# Patient Record
Sex: Male | Born: 1935 | Race: White | Hispanic: No | Marital: Married | Smoking: Former smoker
Health system: Southern US, Community
[De-identification: ages and names within clinical notes are randomized; demographics above are authoritative.]

## PROBLEM LIST (undated history)

## (undated) DIAGNOSIS — E119 Type 2 diabetes mellitus without complications: Secondary | ICD-10-CM

## (undated) DIAGNOSIS — I10 Essential (primary) hypertension: Secondary | ICD-10-CM

## (undated) HISTORY — DX: Type 2 diabetes mellitus without complications: E11.9

## (undated) HISTORY — DX: Essential (primary) hypertension: I10

## (undated) HISTORY — PX: CARDIAC SURGERY: SHX584

---

## 2005-01-09 ENCOUNTER — Ambulatory Visit: Payer: Self-pay | Admitting: Gastroenterology

## 2008-01-08 ENCOUNTER — Ambulatory Visit: Payer: Self-pay | Admitting: Internal Medicine

## 2008-01-16 ENCOUNTER — Ambulatory Visit: Payer: Self-pay | Admitting: Internal Medicine

## 2008-02-07 ENCOUNTER — Ambulatory Visit: Payer: Self-pay | Admitting: Internal Medicine

## 2008-03-06 ENCOUNTER — Ambulatory Visit: Payer: Self-pay | Admitting: Unknown Physician Specialty

## 2008-03-09 ENCOUNTER — Ambulatory Visit: Payer: Self-pay | Admitting: Internal Medicine

## 2008-03-19 ENCOUNTER — Ambulatory Visit: Payer: Self-pay | Admitting: Internal Medicine

## 2008-04-09 ENCOUNTER — Ambulatory Visit: Payer: Self-pay | Admitting: Internal Medicine

## 2008-05-07 ENCOUNTER — Ambulatory Visit: Payer: Self-pay | Admitting: Internal Medicine

## 2008-05-14 ENCOUNTER — Ambulatory Visit: Payer: Self-pay | Admitting: Internal Medicine

## 2008-06-05 ENCOUNTER — Ambulatory Visit: Payer: Self-pay | Admitting: Unknown Physician Specialty

## 2008-06-07 ENCOUNTER — Ambulatory Visit: Payer: Self-pay | Admitting: Internal Medicine

## 2008-08-07 ENCOUNTER — Ambulatory Visit: Payer: Self-pay | Admitting: Internal Medicine

## 2008-08-13 ENCOUNTER — Ambulatory Visit: Payer: Self-pay | Admitting: Internal Medicine

## 2008-09-06 ENCOUNTER — Ambulatory Visit: Payer: Self-pay | Admitting: Internal Medicine

## 2008-10-07 ENCOUNTER — Ambulatory Visit: Payer: Self-pay | Admitting: Internal Medicine

## 2008-10-08 ENCOUNTER — Ambulatory Visit: Payer: Self-pay | Admitting: Internal Medicine

## 2008-11-07 ENCOUNTER — Ambulatory Visit: Payer: Self-pay | Admitting: Internal Medicine

## 2008-12-07 ENCOUNTER — Ambulatory Visit: Payer: Self-pay | Admitting: Internal Medicine

## 2008-12-07 ENCOUNTER — Ambulatory Visit: Payer: Self-pay | Admitting: Unknown Physician Specialty

## 2008-12-31 ENCOUNTER — Ambulatory Visit: Payer: Self-pay | Admitting: Internal Medicine

## 2009-01-07 ENCOUNTER — Ambulatory Visit: Payer: Self-pay | Admitting: Internal Medicine

## 2009-03-09 ENCOUNTER — Ambulatory Visit: Payer: Self-pay | Admitting: Internal Medicine

## 2009-04-01 ENCOUNTER — Ambulatory Visit: Payer: Self-pay | Admitting: Internal Medicine

## 2009-04-09 ENCOUNTER — Ambulatory Visit: Payer: Self-pay | Admitting: Internal Medicine

## 2009-05-07 ENCOUNTER — Ambulatory Visit: Payer: Self-pay | Admitting: Internal Medicine

## 2009-05-13 ENCOUNTER — Ambulatory Visit: Payer: Self-pay | Admitting: Internal Medicine

## 2009-06-07 ENCOUNTER — Ambulatory Visit: Payer: Self-pay | Admitting: Internal Medicine

## 2009-07-07 ENCOUNTER — Ambulatory Visit: Payer: Self-pay | Admitting: Internal Medicine

## 2009-09-06 ENCOUNTER — Ambulatory Visit: Payer: Self-pay | Admitting: Internal Medicine

## 2009-09-23 ENCOUNTER — Ambulatory Visit: Payer: Self-pay | Admitting: Internal Medicine

## 2009-10-07 ENCOUNTER — Ambulatory Visit: Payer: Self-pay | Admitting: Internal Medicine

## 2010-01-13 ENCOUNTER — Ambulatory Visit: Payer: Self-pay | Admitting: Internal Medicine

## 2010-01-20 ENCOUNTER — Encounter: Payer: Self-pay | Admitting: Internal Medicine

## 2010-02-06 ENCOUNTER — Ambulatory Visit: Payer: Self-pay | Admitting: Internal Medicine

## 2010-02-06 ENCOUNTER — Encounter: Payer: Self-pay | Admitting: Internal Medicine

## 2010-05-19 ENCOUNTER — Ambulatory Visit: Payer: Self-pay | Admitting: Internal Medicine

## 2010-06-08 ENCOUNTER — Ambulatory Visit: Payer: Self-pay | Admitting: Internal Medicine

## 2010-06-24 ENCOUNTER — Ambulatory Visit: Payer: Self-pay | Admitting: Gastroenterology

## 2010-08-25 ENCOUNTER — Ambulatory Visit: Payer: Self-pay | Admitting: Internal Medicine

## 2010-09-07 ENCOUNTER — Ambulatory Visit: Payer: Self-pay | Admitting: Internal Medicine

## 2010-10-20 ENCOUNTER — Ambulatory Visit: Payer: Self-pay | Admitting: Internal Medicine

## 2010-11-08 ENCOUNTER — Ambulatory Visit: Payer: Self-pay | Admitting: Internal Medicine

## 2010-12-08 ENCOUNTER — Ambulatory Visit: Payer: Self-pay | Admitting: Internal Medicine

## 2011-01-08 ENCOUNTER — Ambulatory Visit: Payer: Self-pay | Admitting: Internal Medicine

## 2011-04-13 ENCOUNTER — Ambulatory Visit: Payer: Self-pay | Admitting: Internal Medicine

## 2011-04-13 LAB — CBC CANCER CENTER
Basophil %: 0.6 %
Eosinophil #: 0.1 x10 3/mm (ref 0.0–0.7)
Eosinophil %: 2.9 %
HGB: 13.4 g/dL (ref 13.0–18.0)
Lymphocyte %: 27.8 %
MCHC: 33.3 g/dL (ref 32.0–36.0)
Monocyte %: 7.3 %
Neutrophil #: 3 x10 3/mm (ref 1.4–6.5)
Neutrophil %: 61.4 %
Platelet: 192 x10 3/mm (ref 150–440)
RBC: 4.38 10*6/uL — ABNORMAL LOW (ref 4.40–5.90)

## 2011-04-13 LAB — FERRITIN: Ferritin (ARMC): 63 ng/mL (ref 8–388)

## 2011-04-13 LAB — IRON AND TIBC
Iron Saturation: 30 %
Iron: 108 ug/dL (ref 65–175)

## 2011-05-08 ENCOUNTER — Ambulatory Visit: Payer: Self-pay | Admitting: Internal Medicine

## 2013-05-16 DIAGNOSIS — I6529 Occlusion and stenosis of unspecified carotid artery: Secondary | ICD-10-CM | POA: Insufficient documentation

## 2013-05-16 DIAGNOSIS — I480 Paroxysmal atrial fibrillation: Secondary | ICD-10-CM | POA: Insufficient documentation

## 2013-05-16 DIAGNOSIS — Z951 Presence of aortocoronary bypass graft: Secondary | ICD-10-CM | POA: Insufficient documentation

## 2013-05-16 DIAGNOSIS — I2510 Atherosclerotic heart disease of native coronary artery without angina pectoris: Secondary | ICD-10-CM | POA: Insufficient documentation

## 2013-08-25 DIAGNOSIS — M7502 Adhesive capsulitis of left shoulder: Secondary | ICD-10-CM | POA: Insufficient documentation

## 2013-12-18 ENCOUNTER — Ambulatory Visit: Payer: Self-pay | Admitting: Internal Medicine

## 2014-01-15 ENCOUNTER — Ambulatory Visit: Payer: Self-pay | Admitting: Ophthalmology

## 2014-02-14 DIAGNOSIS — E039 Hypothyroidism, unspecified: Secondary | ICD-10-CM | POA: Insufficient documentation

## 2014-03-12 ENCOUNTER — Ambulatory Visit: Payer: Self-pay | Admitting: Ophthalmology

## 2015-06-18 DIAGNOSIS — I350 Nonrheumatic aortic (valve) stenosis: Secondary | ICD-10-CM | POA: Insufficient documentation

## 2015-08-27 DIAGNOSIS — E7800 Pure hypercholesterolemia, unspecified: Secondary | ICD-10-CM | POA: Insufficient documentation

## 2015-11-26 DIAGNOSIS — E538 Deficiency of other specified B group vitamins: Secondary | ICD-10-CM | POA: Insufficient documentation

## 2016-11-19 DIAGNOSIS — D1809 Hemangioma of other sites: Secondary | ICD-10-CM | POA: Insufficient documentation

## 2017-06-01 DIAGNOSIS — D649 Anemia, unspecified: Secondary | ICD-10-CM | POA: Insufficient documentation

## 2017-12-02 DIAGNOSIS — E118 Type 2 diabetes mellitus with unspecified complications: Secondary | ICD-10-CM | POA: Insufficient documentation

## 2017-12-02 DIAGNOSIS — I10 Essential (primary) hypertension: Secondary | ICD-10-CM | POA: Insufficient documentation

## 2017-12-23 ENCOUNTER — Other Ambulatory Visit: Payer: Self-pay | Admitting: Internal Medicine

## 2017-12-23 DIAGNOSIS — I6523 Occlusion and stenosis of bilateral carotid arteries: Secondary | ICD-10-CM

## 2018-01-03 ENCOUNTER — Ambulatory Visit
Admission: RE | Admit: 2018-01-03 | Discharge: 2018-01-03 | Disposition: A | Discharge status: Home / Self Care | Payer: Medicare Other | Source: Ambulatory Visit | Attending: Internal Medicine | Admitting: Internal Medicine

## 2018-01-03 DIAGNOSIS — I6523 Occlusion and stenosis of bilateral carotid arteries: Secondary | ICD-10-CM | POA: Diagnosis not present

## 2018-01-03 LAB — POCT I-STAT CREATININE: Creatinine, Ser: 1.1 mg/dL (ref 0.61–1.24)

## 2018-01-03 MED ORDER — IOPAMIDOL (ISOVUE-370) INJECTION 76%
75.0000 mL | Freq: Once | INTRAVENOUS | Status: AC | PRN
  Administered 2018-01-03: 75 mL via INTRAVENOUS

## 2018-06-06 DIAGNOSIS — Z0000 Encounter for general adult medical examination without abnormal findings: Secondary | ICD-10-CM | POA: Insufficient documentation

## 2018-06-16 DIAGNOSIS — I442 Atrioventricular block, complete: Secondary | ICD-10-CM | POA: Insufficient documentation

## 2019-01-10 ENCOUNTER — Ambulatory Visit (INDEPENDENT_AMBULATORY_CARE_PROVIDER_SITE_OTHER): Payer: Medicare Other | Admitting: Vascular Surgery

## 2019-01-10 ENCOUNTER — Other Ambulatory Visit: Payer: Self-pay

## 2019-01-10 ENCOUNTER — Encounter (INDEPENDENT_AMBULATORY_CARE_PROVIDER_SITE_OTHER): Payer: Self-pay | Admitting: Vascular Surgery

## 2019-01-10 ENCOUNTER — Encounter (INDEPENDENT_AMBULATORY_CARE_PROVIDER_SITE_OTHER): Payer: Self-pay

## 2019-01-10 VITALS — BP 126/58 | HR 86 | Resp 16 | Wt 173.8 lb

## 2019-01-10 DIAGNOSIS — I2510 Atherosclerotic heart disease of native coronary artery without angina pectoris: Secondary | ICD-10-CM | POA: Diagnosis not present

## 2019-01-10 DIAGNOSIS — E7800 Pure hypercholesterolemia, unspecified: Secondary | ICD-10-CM | POA: Diagnosis not present

## 2019-01-10 DIAGNOSIS — E118 Type 2 diabetes mellitus with unspecified complications: Secondary | ICD-10-CM | POA: Diagnosis not present

## 2019-01-10 DIAGNOSIS — I6523 Occlusion and stenosis of bilateral carotid arteries: Secondary | ICD-10-CM

## 2019-01-10 DIAGNOSIS — I10 Essential (primary) hypertension: Secondary | ICD-10-CM | POA: Diagnosis not present

## 2019-01-10 NOTE — Assessment & Plan Note (Signed)
lipid control important in reducing the progression of atherosclerotic disease. Continue statin therapy  

## 2019-01-10 NOTE — Assessment & Plan Note (Signed)
Sent from cardiology so their assessment of whether or not he would be an operative candidate can be gauged prior to planning the procedure.

## 2019-01-10 NOTE — Progress Notes (Signed)
Patient ID: Eric Kennedy, male   DOB: [birth year], 84 y.o.   MRN: [id number]  Chief Complaint  Patient presents with  . New Patient (Initial Visit)    ref Nehemiah Massed for carotid stenosis    HPI Eric Kennedy is a 84 y.o. male.  I am asked to see the patient by Dr. Jacinto Reap. Nehemiah Massed for evaluation of carotid stenosis.  The patient reports no appreciable symptoms of cerebrovascular ischemia.  Specifically, he has not had arm or leg weakness or numbness, speech or swallowing difficulty, or temporary monocular blindness.  He has no reported history of TIA or stroke.  He is in his usual state of health today without complaints.  A carotid duplex done approximately a year ago demonstrated greater than 70% right ICA stenosis and 50 to 69% left ICA stenosis although the velocities and characteristics on the right were borderline for greater than 70% stenosis.   Past Medical History:  Diagnosis Date  . Diabetes mellitus without complication ([place])   . Hypertension      Family History  Problem Relation Age of Onset  . Diabetes Mother   . Cancer Brother   No bleeding or clotting disorders   Social History   Tobacco Use  . Smoking status: Former Research scientist (life sciences)  . Smokeless tobacco: Never Used  Substance Use Topics  . Alcohol use: Never    Frequency: Never  . Drug use: Never    No Known Allergies  Current Outpatient Medications  Medication Sig Dispense Refill  . amLODipine (NORVASC) 10 MG tablet Take by mouth.    Marland Kitchen aspirin EC 81 MG tablet Take by mouth.    . levothyroxine (SYNTHROID) 25 MCG tablet Take by mouth.    . metoprolol succinate (TOPROL-XL) 25 MG 24 hr tablet Take by mouth.    . Multiple Vitamins-Minerals (PX COMPLETE SENIOR MULTIVITS) TABS Take by mouth.    Marland Kitchen omeprazole (PRILOSEC) 40 MG capsule Take by mouth.    . rosuvastatin (CRESTOR) 40 MG tablet Take by mouth.    . telmisartan (MICARDIS) 80 MG tablet Take by mouth.     No current facility-administered medications for this visit.       REVIEW OF SYSTEMS (Negative unless checked)  Constitutional: [] Weight loss  [] Fever  [] Chills Cardiac: [] Chest pain   [] Chest pressure   [] Palpitations   [] Shortness of breath when laying flat   [] Shortness of breath at rest   [x] Shortness of breath with exertion. Vascular:  [] Pain in legs with walking   [] Pain in legs at rest   [] Pain in legs when laying flat   [] Claudication   [] Pain in feet when walking  [] Pain in feet at rest  [] Pain in feet when laying flat   [] History of DVT   [] Phlebitis   [] Swelling in legs   [] Varicose veins   [] Non-healing ulcers Pulmonary:   [] Uses home oxygen   [] Productive cough   [] Hemoptysis   [] Wheeze  [] COPD   [] Asthma Neurologic:  [] Dizziness  [] Blackouts   [] Seizures   [] History of stroke   [] History of TIA  [] Aphasia   [] Temporary blindness   [] Dysphagia   [] Weakness or numbness in arms   [] Weakness or numbness in legs Musculoskeletal:  [x] Arthritis   [] Joint swelling   [] Joint pain   [] Low back pain Hematologic:  [] Easy bruising  [] Easy bleeding   [] Hypercoagulable state   [] Anemic  [] Hepatitis Gastrointestinal:  [] Blood in stool   [] Vomiting blood  [] Gastroesophageal reflux/heartburn   [] Abdominal pain Genitourinary:  [] Chronic kidney  disease   [] Difficult urination  [] Frequent urination  [] Burning with urination   [] Hematuria Skin:  [] Rashes   [] Ulcers   [] Wounds Psychological:  [] History of anxiety   []  History of major depression.    Physical Exam BP (!) 126/58 (BP Location: Right Arm)   Pulse 86   Resp 16   Wt 173 lb 12.8 oz (78.8 kg)  Gen:  WD/WN, NAD.  Appears younger than stated age Head: [place]/AT, No temporalis wasting Ear/Nose/Throat: Hearing grossly intact, nares w/o erythema or drainage, oropharynx w/o Erythema/Exudate Eyes: Conjunctiva clear, sclera non-icteric  Neck: trachea midline.  No JVD.  Bilateral carotid bruits a little louder on the right than the left Pulmonary:  Good air movement, clear to auscultation bilaterally.  Cardiac:  RRR, with systolic murmur Vascular:  Vessel Right Left  Radial Palpable Palpable                                   Gastrointestinal: soft, non-tender/non-distended.  Musculoskeletal: M/S 5/5 throughout.  Extremities without ischemic changes.  No deformity or atrophy.  No edema. Neurologic: Sensation grossly intact in extremities.  Symmetrical.  Speech is fluent. Motor exam as listed above. Psychiatric: Judgment intact, Mood & affect appropriate for pt's clinical situation. Dermatologic: No rashes or ulcers noted.  No cellulitis or open wounds.     Radiology No results found.  Labs No results found for this or any previous visit (from the past 2160 hour(s)).  Assessment/Plan:  Hypertension, well controlled blood pressure control important in reducing the progression of atherosclerotic disease. On appropriate oral medications.   Pure hypercholesterolemia lipid control important in reducing the progression of atherosclerotic disease. Continue statin therapy   3-vessel CAD Sent from cardiology so their assessment of whether or not he would be an operative candidate can be gauged prior to planning the procedure.  Carotid stenosis A carotid duplex done approximately a year ago demonstrated greater than 70% right ICA stenosis and 50 to 69% left ICA stenosis although the velocities and characteristics on the right were borderline for greater than 70% stenosis.  The patient remains asymptomatic with respect to the carotid stenosis.  However, the patient has now progressed and has a lesion the is >70%.  Patient should undergo CT angiography of the carotid arteries to define the degree of stenosis of the internal carotid arteries bilaterally and the anatomic suitability for surgery vs. intervention.  If the patient does indeed need surgery cardiac clearance will be required, once cleared the patient will be scheduled for surgery.  The risks, benefits and alternative therapies  were reviewed in detail with the patient.  All questions were answered.  The patient agrees to proceed with imaging.  Continue antiplatelet therapy as prescribed. Continue management of CAD, HTN and Hyperlipidemia. Healthy heart diet, encouraged exercise at least 4 times per week.        Leotis Pain 01/10/2019, 10:17 AM   This note was created with Dragon medical transcription system.  Any errors from dictation are unintentional.

## 2019-01-10 NOTE — Assessment & Plan Note (Signed)
blood pressure control important in reducing the progression of atherosclerotic disease. On appropriate oral medications.  

## 2019-01-10 NOTE — Assessment & Plan Note (Signed)
A carotid duplex done approximately a year ago demonstrated greater than 70% right ICA stenosis and 50 to 69% left ICA stenosis although the velocities and characteristics on the right were borderline for greater than 70% stenosis.  The patient remains asymptomatic with respect to the carotid stenosis.  However, the patient has now progressed and has a lesion the is >70%.  Patient should undergo CT angiography of the carotid arteries to define the degree of stenosis of the internal carotid arteries bilaterally and the anatomic suitability for surgery vs. intervention.  If the patient does indeed need surgery cardiac clearance will be required, once cleared the patient will be scheduled for surgery.  The risks, benefits and alternative therapies were reviewed in detail with the patient.  All questions were answered.  The patient agrees to proceed with imaging.  Continue antiplatelet therapy as prescribed. Continue management of CAD, HTN and Hyperlipidemia. Healthy heart diet, encouraged exercise at least 4 times per week.

## 2019-01-10 NOTE — Patient Instructions (Signed)
Carotid Artery Disease  The carotid arteries are arteries on both sides of the neck. They carry blood to the brain, face, and neck. Carotid artery disease happens when these arteries become smaller (narrow) or get blocked. If these arteries become smaller or get blocked, you are more likely to have a stroke or a warning stroke (transient ischemic attack). Follow these instructions at home:  Take over-the-counter and prescription medicines only as told by your doctor.  Make sure you understand all instructions about your medicines. Do not stop taking your medicines without talking to your doctor first.  Follow your doctor's diet instructions. It is important to follow a healthy diet. ? Eat foods that include plenty of: ? Fresh fruits. ? Vegetables. ? Lean meats. ? Avoid these foods: ? Foods that are high in fat. ? Foods that are high in salt (sodium). ? Foods that are fried. ? Foods that are processed. ? Foods that have few good nutrients (poor nutritional value).  Keep a healthy weight.  Stay active. Get at least 30 minutes of activity every day.  Do not smoke.  Limit alcohol use to: ? No more than 2 drinks a day for men. ? No more than 1 drink a day for women who are not pregnant.  Do not use illegal drugs.  Keep all follow-up visits as told by your doctor. This is important. Contact a doctor if: Get help right away if:  You have any symptoms of stroke or TIA. The acronym BEFAST is an easy way to remember the main warning signs of stroke. ? B = Balance problems. Signs include dizziness, sudden trouble walking, or loss of balance ? E = Eye problems. This includes trouble seeing or a sudden change in vision. ? F = Face changes. This includes sudden weakness or numbness of the face, or the face or eyelid drooping to one side. ? A = Arm weakness or numbness. This happens suddenly and usually on one side of the body. ? S = Speech problems. This includes trouble speaking or  trouble understanding. ? T = Time. Time to call 911 or seek emergency care. Do not wait to see if symptoms go away. Make note of the time your symptoms started.  Other signs of stroke may include: ? A sudden, severe headache with no known cause. ? Feeling sick to your stomach (nauseous) or throwing up (vomiting). ? Seizure. Call your local emergency services (911 in [place] Do notdrive yourself to the clinic or hospital. Summary  The carotid arteries are arteries on both sides of the neck.  If these arteries get smaller or get blocked, you are more likely to have a stroke or a warning stroke (transient ischemic attack).  Take over-the-counter and prescription medicines only as told by your doctor.  Keep all follow-up visits as told by your doctor. This is important. This information is not intended to replace advice given to you by your health care provider. Make sure you discuss any questions you have with your health care provider. Document Released: 02/10/2012 Document Revised: 02/18/2017 Document Reviewed: 02/18/2017 Elsevier Patient Education  2020 Else[employer] Inc[employer]  

## 2019-01-19 ENCOUNTER — Other Ambulatory Visit: Payer: Self-pay

## 2019-01-19 ENCOUNTER — Ambulatory Visit
Admission: RE | Admit: 2019-01-19 | Discharge: 2019-01-19 | Disposition: A | Discharge status: Home / Self Care | Payer: Medicare Other | Source: Ambulatory Visit | Attending: Vascular Surgery | Admitting: Vascular Surgery

## 2019-01-19 DIAGNOSIS — I6523 Occlusion and stenosis of bilateral carotid arteries: Secondary | ICD-10-CM | POA: Insufficient documentation

## 2019-01-19 LAB — POCT I-STAT CREATININE: Creatinine, Ser: 1.4 mg/dL — ABNORMAL HIGH (ref 0.61–1.24)

## 2019-01-19 MED ORDER — IOHEXOL 350 MG/ML SOLN
75.0000 mL | Freq: Once | INTRAVENOUS | Status: AC | PRN
  Administered 2019-01-19: 75 mL via INTRAVENOUS

## 2020-01-26 ENCOUNTER — Inpatient Hospital Stay: Payer: Medicare Other

## 2020-01-26 ENCOUNTER — Emergency Department: Payer: Medicare Other

## 2020-01-26 ENCOUNTER — Encounter: Payer: Self-pay | Admitting: Emergency Medicine

## 2020-01-26 ENCOUNTER — Inpatient Hospital Stay
Admission: EM | Admit: 2020-01-26 | Discharge: 2020-02-07 | DRG: 064 | Disposition: A | Discharge status: Skilled Nursing Facility | Payer: Medicare Other | Attending: Internal Medicine | Admitting: Internal Medicine

## 2020-01-26 ENCOUNTER — Other Ambulatory Visit: Payer: Self-pay

## 2020-01-26 DIAGNOSIS — N1831 Chronic kidney disease, stage 3a: Secondary | ICD-10-CM | POA: Diagnosis present

## 2020-01-26 DIAGNOSIS — I442 Atrioventricular block, complete: Secondary | ICD-10-CM | POA: Diagnosis present

## 2020-01-26 DIAGNOSIS — E538 Deficiency of other specified B group vitamins: Secondary | ICD-10-CM | POA: Diagnosis present

## 2020-01-26 DIAGNOSIS — I214 Non-ST elevation (NSTEMI) myocardial infarction: Secondary | ICD-10-CM | POA: Diagnosis present

## 2020-01-26 DIAGNOSIS — K2970 Gastritis, unspecified, without bleeding: Secondary | ICD-10-CM | POA: Diagnosis present

## 2020-01-26 DIAGNOSIS — I350 Nonrheumatic aortic (valve) stenosis: Secondary | ICD-10-CM | POA: Diagnosis present

## 2020-01-26 DIAGNOSIS — I472 Ventricular tachycardia: Secondary | ICD-10-CM | POA: Diagnosis not present

## 2020-01-26 DIAGNOSIS — D509 Iron deficiency anemia, unspecified: Secondary | ICD-10-CM | POA: Diagnosis present

## 2020-01-26 DIAGNOSIS — I480 Paroxysmal atrial fibrillation: Secondary | ICD-10-CM | POA: Diagnosis present

## 2020-01-26 DIAGNOSIS — Z951 Presence of aortocoronary bypass graft: Secondary | ICD-10-CM

## 2020-01-26 DIAGNOSIS — Z20822 Contact with and (suspected) exposure to covid-19: Secondary | ICD-10-CM | POA: Diagnosis present

## 2020-01-26 DIAGNOSIS — I129 Hypertensive chronic kidney disease with stage 1 through stage 4 chronic kidney disease, or unspecified chronic kidney disease: Secondary | ICD-10-CM | POA: Diagnosis present

## 2020-01-26 DIAGNOSIS — J90 Pleural effusion, not elsewhere classified: Secondary | ICD-10-CM | POA: Diagnosis present

## 2020-01-26 DIAGNOSIS — I63511 Cerebral infarction due to unspecified occlusion or stenosis of right middle cerebral artery: Principal | ICD-10-CM

## 2020-01-26 DIAGNOSIS — E871 Hypo-osmolality and hyponatremia: Secondary | ICD-10-CM | POA: Diagnosis not present

## 2020-01-26 DIAGNOSIS — I69354 Hemiplegia and hemiparesis following cerebral infarction affecting left non-dominant side: Secondary | ICD-10-CM | POA: Diagnosis not present

## 2020-01-26 DIAGNOSIS — R1310 Dysphagia, unspecified: Secondary | ICD-10-CM | POA: Diagnosis not present

## 2020-01-26 DIAGNOSIS — E1151 Type 2 diabetes mellitus with diabetic peripheral angiopathy without gangrene: Secondary | ICD-10-CM | POA: Diagnosis present

## 2020-01-26 DIAGNOSIS — I252 Old myocardial infarction: Secondary | ICD-10-CM

## 2020-01-26 DIAGNOSIS — N1832 Chronic kidney disease, stage 3b: Secondary | ICD-10-CM | POA: Diagnosis present

## 2020-01-26 DIAGNOSIS — Z515 Encounter for palliative care: Secondary | ICD-10-CM

## 2020-01-26 DIAGNOSIS — I639 Cerebral infarction, unspecified: Secondary | ICD-10-CM

## 2020-01-26 DIAGNOSIS — Z950 Presence of cardiac pacemaker: Secondary | ICD-10-CM

## 2020-01-26 DIAGNOSIS — R571 Hypovolemic shock: Secondary | ICD-10-CM | POA: Diagnosis not present

## 2020-01-26 DIAGNOSIS — E440 Moderate protein-calorie malnutrition: Secondary | ICD-10-CM

## 2020-01-26 DIAGNOSIS — E1122 Type 2 diabetes mellitus with diabetic chronic kidney disease: Secondary | ICD-10-CM | POA: Diagnosis present

## 2020-01-26 DIAGNOSIS — R6251 Failure to thrive (child): Secondary | ICD-10-CM

## 2020-01-26 DIAGNOSIS — D62 Acute posthemorrhagic anemia: Secondary | ICD-10-CM | POA: Diagnosis present

## 2020-01-26 DIAGNOSIS — E785 Hyperlipidemia, unspecified: Secondary | ICD-10-CM | POA: Diagnosis present

## 2020-01-26 DIAGNOSIS — R627 Adult failure to thrive: Secondary | ICD-10-CM | POA: Diagnosis present

## 2020-01-26 DIAGNOSIS — Z7189 Other specified counseling: Secondary | ICD-10-CM | POA: Diagnosis not present

## 2020-01-26 DIAGNOSIS — I82622 Acute embolism and thrombosis of deep veins of left upper extremity: Secondary | ICD-10-CM | POA: Diagnosis present

## 2020-01-26 DIAGNOSIS — K922 Gastrointestinal hemorrhage, unspecified: Secondary | ICD-10-CM

## 2020-01-26 DIAGNOSIS — I2510 Atherosclerotic heart disease of native coronary artery without angina pectoris: Secondary | ICD-10-CM | POA: Diagnosis present

## 2020-01-26 DIAGNOSIS — Z66 Do not resuscitate: Secondary | ICD-10-CM | POA: Diagnosis not present

## 2020-01-26 DIAGNOSIS — E039 Hypothyroidism, unspecified: Secondary | ICD-10-CM | POA: Diagnosis present

## 2020-01-26 DIAGNOSIS — K920 Hematemesis: Secondary | ICD-10-CM | POA: Diagnosis present

## 2020-01-26 DIAGNOSIS — Z87891 Personal history of nicotine dependence: Secondary | ICD-10-CM

## 2020-01-26 DIAGNOSIS — R578 Other shock: Secondary | ICD-10-CM

## 2020-01-26 DIAGNOSIS — W19XXXA Unspecified fall, initial encounter: Secondary | ICD-10-CM

## 2020-01-26 DIAGNOSIS — Z952 Presence of prosthetic heart valve: Secondary | ICD-10-CM

## 2020-01-26 DIAGNOSIS — Z833 Family history of diabetes mellitus: Secondary | ICD-10-CM

## 2020-01-26 DIAGNOSIS — K449 Diaphragmatic hernia without obstruction or gangrene: Secondary | ICD-10-CM | POA: Diagnosis present

## 2020-01-26 DIAGNOSIS — M7989 Other specified soft tissue disorders: Secondary | ICD-10-CM

## 2020-01-26 DIAGNOSIS — I63512 Cerebral infarction due to unspecified occlusion or stenosis of left middle cerebral artery: Secondary | ICD-10-CM | POA: Diagnosis not present

## 2020-01-26 DIAGNOSIS — J9601 Acute respiratory failure with hypoxia: Secondary | ICD-10-CM | POA: Diagnosis present

## 2020-01-26 DIAGNOSIS — E870 Hyperosmolality and hypernatremia: Secondary | ICD-10-CM | POA: Diagnosis not present

## 2020-01-26 DIAGNOSIS — R2990 Unspecified symptoms and signs involving the nervous system: Secondary | ICD-10-CM

## 2020-01-26 DIAGNOSIS — Z6825 Body mass index (BMI) 25.0-25.9, adult: Secondary | ICD-10-CM

## 2020-01-26 DIAGNOSIS — I272 Pulmonary hypertension, unspecified: Secondary | ICD-10-CM | POA: Diagnosis present

## 2020-01-26 DIAGNOSIS — K259 Gastric ulcer, unspecified as acute or chronic, without hemorrhage or perforation: Secondary | ICD-10-CM | POA: Diagnosis present

## 2020-01-26 DIAGNOSIS — E876 Hypokalemia: Secondary | ICD-10-CM | POA: Diagnosis not present

## 2020-01-26 LAB — CBC WITH DIFFERENTIAL/PLATELET
Abs Immature Granulocytes: 0.1 10*3/uL — ABNORMAL HIGH (ref 0.00–0.07)
Basophils Absolute: 0.1 10*3/uL (ref 0.0–0.1)
Basophils Relative: 1 %
Eosinophils Absolute: 0 10*3/uL (ref 0.0–0.5)
Eosinophils Relative: 0 %
HCT: 20 % — ABNORMAL LOW (ref 39.0–52.0)
Hemoglobin: 4.7 g/dL — CL (ref 13.0–17.0)
Immature Granulocytes: 1 %
Lymphocytes Relative: 8 %
Lymphs Abs: 0.9 10*3/uL (ref 0.7–4.0)
MCH: 17.5 pg — ABNORMAL LOW (ref 26.0–34.0)
MCHC: 23.5 g/dL — ABNORMAL LOW (ref 30.0–36.0)
MCV: 74.3 fL — ABNORMAL LOW (ref 80.0–100.0)
Monocytes Absolute: 0.6 10*3/uL (ref 0.1–1.0)
Monocytes Relative: 6 %
Neutro Abs: 9.3 10*3/uL — ABNORMAL HIGH (ref 1.7–7.7)
Neutrophils Relative %: 84 %
Platelets: 286 10*3/uL (ref 150–400)
RBC: 2.69 MIL/uL — ABNORMAL LOW (ref 4.22–5.81)
RDW: 22.3 % — ABNORMAL HIGH (ref 11.5–15.5)
Smear Review: NORMAL
WBC: 10.9 10*3/uL — ABNORMAL HIGH (ref 4.0–10.5)
nRBC: 0.4 % — ABNORMAL HIGH (ref 0.0–0.2)

## 2020-01-26 LAB — URINE DRUG SCREEN, QUALITATIVE (ARMC ONLY)
Amphetamines, Ur Screen: NOT DETECTED
Barbiturates, Ur Screen: NOT DETECTED
Benzodiazepine, Ur Scrn: NOT DETECTED
Cannabinoid 50 Ng, Ur ~~LOC~~: NOT DETECTED
Cocaine Metabolite,Ur ~~LOC~~: NOT DETECTED
MDMA (Ecstasy)Ur Screen: NOT DETECTED
Methadone Scn, Ur: NOT DETECTED
Opiate, Ur Screen: NOT DETECTED
Phencyclidine (PCP) Ur S: NOT DETECTED
Tricyclic, Ur Screen: NOT DETECTED

## 2020-01-26 LAB — BLOOD GAS, ARTERIAL
Acid-base deficit: 6.7 mmol/L — ABNORMAL HIGH (ref 0.0–2.0)
Bicarbonate: 17.1 mmol/L — ABNORMAL LOW (ref 20.0–28.0)
FIO2: 40
MECHVT: 500 mL
Mechanical Rate: 16
O2 Saturation: 99.3 %
PEEP: 5 cmH2O
Patient temperature: 37
pCO2 arterial: 27 mmHg — ABNORMAL LOW (ref 32.0–48.0)
pH, Arterial: 7.41 (ref 7.350–7.450)
pO2, Arterial: 146 mmHg — ABNORMAL HIGH (ref 83.0–108.0)

## 2020-01-26 LAB — COMPREHENSIVE METABOLIC PANEL
ALT: 19 U/L (ref 0–44)
AST: 57 U/L — ABNORMAL HIGH (ref 15–41)
Albumin: 3.4 g/dL — ABNORMAL LOW (ref 3.5–5.0)
Alkaline Phosphatase: 81 U/L (ref 38–126)
Anion gap: 20 — ABNORMAL HIGH (ref 5–15)
BUN: 19 mg/dL (ref 8–23)
CO2: 11 mmol/L — ABNORMAL LOW (ref 22–32)
Calcium: 9.7 mg/dL (ref 8.9–10.3)
Chloride: 109 mmol/L (ref 98–111)
Creatinine, Ser: 2.01 mg/dL — ABNORMAL HIGH (ref 0.61–1.24)
GFR, Estimated: 32 mL/min — ABNORMAL LOW (ref >60)
Glucose, Bld: 136 mg/dL — ABNORMAL HIGH (ref 70–99)
Potassium: 4.6 mmol/L (ref 3.5–5.1)
Sodium: 140 mmol/L (ref 135–145)
Total Bilirubin: 1.9 mg/dL — ABNORMAL HIGH (ref 0.3–1.2)
Total Protein: 6.2 g/dL — ABNORMAL LOW (ref 6.5–8.1)

## 2020-01-26 LAB — URINALYSIS, COMPLETE (UACMP) WITH MICROSCOPIC
Bacteria, UA: NONE SEEN
Bilirubin Urine: NEGATIVE
Glucose, UA: NEGATIVE mg/dL
Ketones, ur: NEGATIVE mg/dL
Leukocytes,Ua: NEGATIVE
Nitrite: NEGATIVE
Protein, ur: 100 mg/dL — AB
Specific Gravity, Urine: 1.014 (ref 1.005–1.030)
pH: 5 (ref 5.0–8.0)

## 2020-01-26 LAB — TROPONIN I (HIGH SENSITIVITY)
Troponin I (High Sensitivity): 37 ng/L — ABNORMAL HIGH (ref <18)
Troponin I (High Sensitivity): 1839 ng/L (ref <18)
Troponin I (High Sensitivity): 7702 ng/L (ref <18)

## 2020-01-26 LAB — RESP PANEL BY RT-PCR (FLU A&B, COVID) ARPGX2
Influenza A by PCR: NEGATIVE
Influenza B by PCR: NEGATIVE
SARS Coronavirus 2 by RT PCR: NEGATIVE

## 2020-01-26 LAB — ABO/RH: ABO/RH(D): AB POS

## 2020-01-26 LAB — GLUCOSE, CAPILLARY: Glucose-Capillary: 118 mg/dL — ABNORMAL HIGH (ref 70–99)

## 2020-01-26 LAB — HEMOGLOBIN AND HEMATOCRIT, BLOOD
HCT: 25.1 % — ABNORMAL LOW (ref 39.0–52.0)
Hemoglobin: 7.3 g/dL — ABNORMAL LOW (ref 13.0–17.0)

## 2020-01-26 LAB — PROTIME-INR
INR: 1.5 — ABNORMAL HIGH (ref 0.8–1.2)
Prothrombin Time: 17.4 seconds — ABNORMAL HIGH (ref 11.4–15.2)

## 2020-01-26 LAB — PREPARE RBC (CROSSMATCH)

## 2020-01-26 LAB — APTT: aPTT: 28 seconds (ref 24–36)

## 2020-01-26 LAB — MAGNESIUM: Magnesium: 2.2 mg/dL (ref 1.7–2.4)

## 2020-01-26 LAB — MRSA PCR SCREENING: MRSA by PCR: NEGATIVE

## 2020-01-26 LAB — ETHANOL: Alcohol, Ethyl (B): <10 mg/dL (ref <10)

## 2020-01-26 MED ORDER — SODIUM CHLORIDE 0.9 % IV SOLN
8.0000 mg/h | INTRAVENOUS | Status: AC
  Administered 2020-01-26 – 2020-01-29 (×6): 8 mg/h via INTRAVENOUS
  Filled 2020-01-26 (×8): qty 80

## 2020-01-26 MED ORDER — ORAL CARE MOUTH RINSE
15.0000 mL | OROMUCOSAL | Status: DC
  Administered 2020-01-26 – 2020-01-29 (×28): 15 mL via OROMUCOSAL

## 2020-01-26 MED ORDER — DOCUSATE SODIUM 50 MG/5ML PO LIQD: 100.0000 mg | Freq: Two times a day (BID) | ORAL | Status: DC

## 2020-01-26 MED ORDER — SODIUM CHLORIDE 0.9% FLUSH: 10.0000 mL | INTRAVENOUS | Status: DC | PRN

## 2020-01-26 MED ORDER — SODIUM CHLORIDE 0.9 % IV SOLN
80.0000 mg | Freq: Once | INTRAVENOUS | Status: AC
  Administered 2020-01-26: 80 mg via INTRAVENOUS
  Filled 2020-01-26: qty 80

## 2020-01-26 MED ORDER — POLYETHYLENE GLYCOL 3350 17 G PO PACK
17.0000 g | PACK | Freq: Every day | ORAL | Status: DC
  Administered 2020-01-31: 17 g
  Filled 2020-01-26 (×2): qty 1

## 2020-01-26 MED ORDER — MIDAZOLAM HCL 2 MG/2ML IJ SOLN
1.0000 mg | INTRAMUSCULAR | Status: DC | PRN
  Administered 2020-01-28: 1 mg via INTRAVENOUS

## 2020-01-26 MED ORDER — ASPIRIN 300 MG RE SUPP
300.0000 mg | RECTAL | Status: AC
  Administered 2020-01-26: 300 mg via RECTAL
  Filled 2020-01-26: qty 1

## 2020-01-26 MED ORDER — DOCUSATE SODIUM 50 MG/5ML PO LIQD
100.0000 mg | Freq: Two times a day (BID) | ORAL | Status: DC
  Administered 2020-01-31: 100 mg
  Filled 2020-01-26 (×4): qty 10

## 2020-01-26 MED ORDER — MIDAZOLAM HCL 2 MG/2ML IJ SOLN
1.0000 mg | INTRAMUSCULAR | Status: DC | PRN
  Filled 2020-01-26: qty 2

## 2020-01-26 MED ORDER — SODIUM CHLORIDE 0.9% IV SOLUTION
Freq: Once | INTRAVENOUS | Status: AC
  Filled 2020-01-26: qty 250

## 2020-01-26 MED ORDER — PANTOPRAZOLE SODIUM 40 MG IV SOLR
40.0000 mg | Freq: Two times a day (BID) | INTRAVENOUS | Status: DC
  Administered 2020-01-30 – 2020-02-02 (×7): 40 mg via INTRAVENOUS
  Filled 2020-01-26 (×7): qty 40

## 2020-01-26 MED ORDER — POLYETHYLENE GLYCOL 3350 17 G PO PACK: 17.0000 g | PACK | Freq: Every day | ORAL | Status: DC | PRN

## 2020-01-26 MED ORDER — FENTANYL CITRATE (PF) 100 MCG/2ML IJ SOLN
INTRAMUSCULAR | Status: AC | PRN
Start: 2020-01-26 — End: 2020-01-26
  Administered 2020-01-26: 150 ug via INTRAVENOUS

## 2020-01-26 MED ORDER — ASPIRIN 81 MG PO CHEW: 324.0000 mg | CHEWABLE_TABLET | ORAL | Status: AC

## 2020-01-26 MED ORDER — CHLORHEXIDINE GLUCONATE CLOTH 2 % EX PADS
6.0000 | MEDICATED_PAD | Freq: Every day | CUTANEOUS | Status: DC
  Administered 2020-01-26 – 2020-01-31 (×4): 6 via TOPICAL

## 2020-01-26 MED ORDER — SODIUM CHLORIDE 0.9% IV SOLUTION: Freq: Once | INTRAVENOUS | Status: AC

## 2020-01-26 MED ORDER — POLYETHYLENE GLYCOL 3350 17 G PO PACK: 17.0000 g | PACK | Freq: Every day | ORAL | Status: DC

## 2020-01-26 MED ORDER — ROCURONIUM BROMIDE 50 MG/5ML IV SOLN
INTRAVENOUS | Status: AC | PRN
  Administered 2020-01-26: 40 mg via INTRAVENOUS

## 2020-01-26 MED ORDER — SODIUM CHLORIDE 0.9% FLUSH
10.0000 mL | Freq: Two times a day (BID) | INTRAVENOUS | Status: DC
  Administered 2020-01-27 – 2020-01-28 (×2): 10 mL
  Administered 2020-01-28: 3 mL
  Administered 2020-01-29 (×2): 10 mL
  Administered 2020-01-30: 20 mL
  Administered 2020-01-30 – 2020-02-07 (×11): 10 mL

## 2020-01-26 MED ORDER — SODIUM CHLORIDE 0.9 % IV SOLN
1.0000 g | INTRAVENOUS | Status: DC
  Administered 2020-01-26 – 2020-01-28 (×3): 1 g via INTRAVENOUS
  Filled 2020-01-26: qty 10
  Filled 2020-01-26: qty 1
  Filled 2020-01-26: qty 10
  Filled 2020-01-26: qty 1

## 2020-01-26 MED ORDER — CHLORHEXIDINE GLUCONATE 0.12% ORAL RINSE (MEDLINE KIT)
15.0000 mL | Freq: Two times a day (BID) | OROMUCOSAL | Status: DC
  Administered 2020-01-26 – 2020-02-07 (×20): 15 mL via OROMUCOSAL

## 2020-01-26 MED ORDER — FENTANYL 2500MCG IN NS 250ML (10MCG/ML) PREMIX INFUSION
0.0000 ug/h | INTRAVENOUS | Status: DC
  Administered 2020-01-26: 25 ug/h via INTRAVENOUS
  Administered 2020-01-27: 150 ug/h via INTRAVENOUS
  Filled 2020-01-26 (×2): qty 250

## 2020-01-26 MED ORDER — MIDAZOLAM HCL 5 MG/5ML IJ SOLN
INTRAMUSCULAR | Status: AC | PRN
  Administered 2020-01-26: 2 mg via INTRAVENOUS

## 2020-01-26 NOTE — ED Notes (Signed)
Hemoccult exam performed by Dr. Tamala Julian, hemoccult negative.

## 2020-01-26 NOTE — Procedures (Signed)
Endotracheal Intubation: Patient required placement of an artificial airway secondary to Respiratory Failure  Consent: by wife and patient witnessed by RN Robin Register  Hand washing performed prior to starting the procedure.   Medications administered for sedation prior to procedure:  Midazolam 4 mg IV,  Rocuronium 40 mg IV, Fentanyl 150 mcg IV.    A time out procedure was called and correct patient, name, & ID confirmed. Needed supplies and equipment were assembled and checked to include ETT, 10 ml syringe, Glidescope, Mac and Miller blades, suction, oxygen and bag mask valve, end tidal CO2 monitor.   Patient was positioned to align the mouth and pharynx to facilitate visualization of the glottis.   Heart rate, SpO2 and blood pressure was continuously monitored during the procedure. Pre-oxygenation was conducted prior to intubation and endotracheal tube was placed through the vocal cords into the trachea.     The artificial airway was placed under direct visualization via glidescope route using a 8 ETT on the first attempt.  ETT was secured at 23 cm mark.  Placement was confirmed by auscuitation of lungs with good breath sounds bilaterally and no stomach sounds.  Condensation was noted on endotracheal tube.   Pulse ox 98%.  CO2 detector in place with appropriate color change.   Complications: None .   Operator: Analya Louissaint   Chest radiograph ordered and pending.   Comments: OGT placed via glidescope.   Ottie Glazier, M.D.  Pulmonary & [place]

## 2020-01-26 NOTE — ED Notes (Signed)
Medication Reconciliation Report  For Home History Technicians  HIGHLIGHTS:  1. The patient WAS NOT personally interviewed 2. If not, what was the main source used: PHARMACY RECORDS 3. Does the patient appear to take any anti-coagulation agents (e.g. warfarin, Eliquis or Xarelto): NO 4. Does the patient appear to take any anti-convulsant agents (e.g. divalproex, levetiracetam or phenytoin): NO 5. Does the patient appear to use any insulin products (e.g. Lantus, Novolin or Humalog): NO 6. Does the patient appear to take any "beta-blockers" (e.g. metoprolol, carvedilol or bisoprolol: NO  BARRIERS:  1. Were there any barriers that prevented or complicated the medication reconciliation process: YES 2. If yes, what was the primary barrier encountered: Critical condition 3. Does the patient appear compliant with prescribed medications: UNABLE TO DETERMINE 4. Does the patient express any barriers with compliance: UNABLE TO DETERMINE 5. What is the primary barrier the patient reports: None   NOTES:[Include any concerns, remarks or complaints the patient expresses regarding medication therapy. Any observations or other information that might be useful to the treatment team can also be included. Immediate needs or concerns should be referred to the RN or appropriate member of the treatment team.]  The patient was unable to participate in home medication interview. Spouse at bedside was unable to offer any details. Available pharmacy records reviewed to update home medications tab.   Colen Darling, CPhT [place] at [facility] [place]. [address], [place] [address] [phone]  ** The above is intended solely for informational and/or communicative purposes. It should in no way be considered an endorsement of any specific treatment, therapy or action. **

## 2020-01-26 NOTE — ED Notes (Signed)
Patient arrived to ER via EMS. Patient A&OX3. Patient reports he doesn't remember how he got in the floor of the garage. Patients wife found patient and called EMS. Upon MD evaluation patient noted to not be moving left side and left sided vision loss. Patient unable to tell when he was last normal. Patients wife reports patient has been getting weak for approx 1 month. Patients wife is poor historian and states patient is 85 year old. Patient reports he was reading the paper this morning and usually walks without assistance at baseline. Patient reports left hip pain and headache.

## 2020-01-26 NOTE — ED Notes (Signed)
Dr. [name] at bedside.  

## 2020-01-26 NOTE — Progress Notes (Signed)
Chaplain engaged in follow-up visit with Paul and his wife Mrs. Tash.  During visit, Mrs. Hackenberg shared about Giancarlos and their life together.  Chaplain learned that they met in grade school and are high school sweethearts.  They have been married over 66 years.  Mrs. Heidel expressed that it was love at first sight for Mr. Hayashi and that he proclaimed that he was going to marry her when they were elementary school age.  Mrs. Surratt also shared that her husband is an [employer] fast streamer, having served in [place] and [place].  She was able to spend time with him by his side in [place] after their children were older and married.  Chaplain affirmed and celebrated the life they have shared together.  Mrs. Anthis also shared that she noticed Mr. Fricker declining over the last two weeks.  She has been with him through a similar time in the hospital when they were in [place].  She could immediately tell the signs.  She vocalized that her husband is very stubborn.  Chaplain affirmed Mrs. Drotar ability to advocate for her husband and recognize his need to go to the hospital.   Chaplain offered support and prayer with Mrs. Cadmus. Chaplains will continue to follow-up.    01/26/20 2100  Clinical Encounter Type  Visited With Patient and family together  Visit Type Follow-up  Referral From Chaplain  Consult/Referral To Chaplain

## 2020-01-26 NOTE — Progress Notes (Signed)
Per  bedside RN, pt will be transferred to ICU. MD requesting for midline,despite creatinine of 28, Md to placed order for midline. Pt has 2 working PIV's at this time. Pt. care is ongoing at this time.

## 2020-01-26 NOTE — ED Notes (Signed)
ICU states they cannot accept patient d/t other patients coming to floor soon

## 2020-01-26 NOTE — ED Notes (Signed)
Patient noted to be less pale, patient A&OX2, patient keeps repeating "I'm cold" over and over. Patient provided with additional warm blankets and socks. Wife and Chaplain at bedside.

## 2020-01-26 NOTE — ED Notes (Signed)
Neurology at bedside.

## 2020-01-26 NOTE — H&P (Signed)
CRITICAL CARE NOTE      CHIEF COMPLAINT:   Acute blood loss anemia and acute CVA   SUBJECTIVE FINDINGS & SIGNIFICANT EVENTS   This 85 year old male with a history of coronary artery disease, coronary artery bypass grafting, complete heart block, atrial fibrillation aortic stenosis carotid artery stenosis, history of hypothyroidism and diabetes type 2, adhesive capsulitis of the left shoulder, chronic microcytic anemia history of choroidal hemangioma, dyslipidemia, chronic B12 deficiency, came in after mechanical fall and was found in the garage by his wife.  She is unclear how long his been down.  Wife is not a good historian and is unable to recall details of events.  On admission to the ED patient did have a CT head which revealed acute infarcts in the right frontal and parietal and basal ganglia. Wife at bedside states patient is full code, we discussed intubation she clarified code status and stated patient was intubated in past and he wants it done again.  Patient was arousable and was able to clarify he wants everything done. I spoke to neurologist and Gastroenterologist and plan is for endoscopy and colonoscopy. MRI was unable to be done due to pacemaker.     PAST MEDICAL HISTORY   Past Medical History:  Diagnosis Date  . Diabetes mellitus without complication ([place])   . Hypertension      SURGICAL HISTORY   Past Surgical History:  Procedure Laterality Date  . CARDIAC SURGERY     triple bypass     FAMILY HISTORY   Family History  Problem Relation Age of Onset  . Diabetes Mother   . Cancer Brother      SOCIAL HISTORY   Social History   Tobacco Use  . Smoking status: Former Research scientist (life sciences)  . Smokeless tobacco: Never Used  Substance Use Topics  . Alcohol use: Never  . Drug use: Never      MEDICATIONS   Current Medication:  Current Facility-Administered Medications:  .  0.9 %  sodium chloride infusion (Manually program via Guardrails IV Fluids), , Intravenous, Once, Vladimir Crofts, MD .  aspirin chewable tablet 324 mg, 324 mg, Oral, NOW **OR** aspirin suppository 300 mg, 300 mg, Rectal, NOW, Lanney Gins, Rashena Dowling, MD .  pantoprazole (PROTONIX) 80 mg in sodium chloride 0.9 % 100 mL (0.8 mg/mL) infusion, 8 mg/hr, Intravenous, Continuous, Vladimir Crofts, MD, Last Rate: 10 mL/hr at 01/26/20 1733, 8 mg/hr at 01/26/20 1733 .  [START ON 01/30/2020] pantoprazole (PROTONIX) injection 40 mg, 40 mg, Intravenous, Q12H, Vladimir Crofts, MD .  polyethylene glycol (MIRALAX / GLYCOLAX) packet 17 g, 17 g, Oral, Daily PRN, Ottie Glazier, MD  Current Outpatient Medications:  .  amLODipine (NORVASC) 10 MG tablet, Take 10 mg by mouth daily. , Disp: , Rfl:  .  aspirin EC 81 MG tablet, Take 81 mg by mouth daily. , Disp: , Rfl:  .  levothyroxine (SYNTHROID) 50 MCG tablet, Take 50 mcg by mouth daily. , Disp: , Rfl:  .  Multiple Vitamins-Minerals (PX COMPLETE SENIOR MULTIVITS) TABS, Take 1 tablet by mouth daily. , Disp: , Rfl:  .  omeprazole (PRILOSEC) 40 MG capsule, Take 40 mg by mouth daily. , Disp: , Rfl:  .  rosuvastatin (CRESTOR) 40 MG tablet, Take 40 mg by mouth daily. , Disp: , Rfl:  .  telmisartan (MICARDIS) 80 MG tablet, Take 80 mg by mouth daily. , Disp: , Rfl:     ALLERGIES   Patient has no known allergies.    REVIEW OF SYSTEMS  Unable to obtain due to encephalopathy.   PHYSICAL EXAMINATION   Vitals:   01/26/20 1700 01/26/20 1715  BP:  102/77  Pulse:  94  Resp:  (!) 24  Temp:    SpO2: (!) 89% (!) 89%    GENERAL:age appropirate encephalopathic HEAD: Normocephalic, atraumatic.  EYES: Pupils equal, round, reactive to light.  No scleral icterus.  MOUTH: Moist mucosal membrane. Dry blood around mouth NECK: Supple. No thyromegaly. No nodules. No JVD.  PULMONARY: mildly  rhonchorous. CARDIOVASCULAR: S1 and S2. Regular rate and rhythm. No murmurs, rubs, or gallops.  GASTROINTESTINAL: Soft, nontender, non-distended. No masses. Positive bowel sounds. No hepatosplenomegaly.  MUSCULOSKELETAL: No swelling, clubbing, or edema.  NEUROLOGIC: Mild distress due to acute illness SKIN:intact,warm,dry   PERTINENT DATA      Lines / Drains: PIV  Cultures / Sepsis markers: BLOOD X2  Antibiotics: Rocephin 1g    Protocols / Consultants: GI & Neuro   Infusions: . pantoprozole (PROTONIX) infusion 8 mg/hr (01/26/20 1733)   Scheduled Medications: . sodium chloride   Intravenous Once  . aspirin  324 mg Oral NOW   Or  . aspirin  300 mg Rectal NOW  . [START ON 01/30/2020] pantoprazole  40 mg Intravenous Q12H   PRN Medications: polyethylene glycol Hemodynamic parameters:   Intake/Output: No intake/output data recorded.  Ventilator  Settings:     Other Labs:    LAB RESULTS:  Basic Metabolic Panel: Recent Labs  Lab 01/26/20 1532  NA 140  K 4.6  CL 109  CO2 11*  GLUCOSE 136*  BUN 19  CREATININE 2.01*  CALCIUM 9.7  MG 2.2   Liver Function Tests: Recent Labs  Lab 01/26/20 1532  AST 57*  ALT 19  ALKPHOS 81  BILITOT 1.9*  PROT 6.2*  ALBUMIN 3.4*   No results for input(s): LIPASE, AMYLASE in the last 168 hours. No results for input(s): AMMONIA in the last 168 hours. CBC: Recent Labs  Lab 01/26/20 1532  WBC 10.9*  NEUTROABS 9.3*  HGB 4.7*  HCT 20.0*  MCV 74.3*  PLT 286   Cardiac Enzymes: No results for input(s): CKTOTAL, CKMB, CKMBINDEX, TROPONINI in the last 168 hours. BNP: Invalid input(s): POCBNP CBG: No results for input(s): GLUCAP in the last 168 hours.   IMAGING RESULTS:  Imaging: DG Chest 1 View  Result Date: 01/26/2020 CLINICAL DATA:  85 year old male found down. EXAM: CHEST  1 VIEW COMPARISON:  None. FINDINGS: Portable AP supine view at 1644 hours. Prior sternotomy, cardiac valve replacement. Left chest  dual lead cardiac pacemaker. Cardiac size and mediastinal contours are within normal limits. Mildly low lung volumes. Asymmetric left lateral and costophrenic angle pleural density. Mild associated patchy peripheral opacity in the left lung. Mild streaky opacity in the left mid lung more resembling atelectasis or scarring. Allowing for portable technique the right lung is clear. No pneumothorax is evident. No rib fracture is identified. Grossly intact other visible osseous structures. Negative visible bowel gas pattern. IMPRESSION: 1. Suspicion of a small left pleural effusion, and patchy nonspecific opacity in the left mid lung. No adjacent rib fracture is identified radiographically. 2. No other acute cardiopulmonary abnormality. Electronically Signed   By: Genevie Ann M.D.   On: 01/26/2020 17:18   DG Pelvis 1-2 Views  Result Date: 01/26/2020 CLINICAL DATA:  85 year old male found down. EXAM: PELVIS - 1-2 VIEW COMPARISON:  None. FINDINGS: Portable AP supine view at 1643 hours. Femoral heads are normally located. Hip joint spaces are normal for age. The pelvis  appears intact. Grossly intact proximal femurs. Calcified femoral artery atherosclerosis. Oval circumscribed probable calcified phlebolith in the right central pelvis. Negative visible bowel gas pattern. IMPRESSION: No acute fracture or dislocation identified about the pelvis. Electronically Signed   By: Genevie Ann M.D.   On: 01/26/2020 17:19   CT HEAD CODE STROKE WO CONTRAST  Result Date: 01/26/2020 CLINICAL DATA:  Code stroke. Left-sided collect and upper extremity weakness. EXAM: CT HEAD WITHOUT CONTRAST TECHNIQUE: Contiguous axial images were obtained from the base of the skull through the vertex without intravenous contrast. COMPARISON:  01/03/2018 head and neck CTA FINDINGS: The study is mildly motion degraded despite repeat imaging. Brain: There are small cortical infarcts in the posterior right frontal lobe (precentral gyrus extending into the  centrum semiovale) and right parietal lobe which are new from the CT and of indeterminate age though may be acute or subacute. There is also a new age indeterminate lacunar infarct in the right caudate head. No acute intracranial hemorrhage, mass, midline shift, or extra-axial fluid collection is identified. There is mild cerebral atrophy. Hypodensities in the cerebral white matter bilaterally are nonspecific but compatible with mild chronic small vessel ischemic disease. Vascular: Calcified atherosclerosis at the skull base. No hyperdense vessel. Skull: No fracture or suspicious osseous lesion. Sinuses/Orbits: Chronic right sphenoid sinusitis. Small chronic right mastoid effusion. Bilateral cataract extraction. Other: None. ASPECTS [facility] Stroke Program Early CT Score) - Ganglionic level infarction (caudate, lentiform nuclei, internal capsule, insula, M1-M3 cortex): 6 - Supraganglionic infarction (M4-M6 cortex): 2 Total score (0-10 with 10 being normal): 8 IMPRESSION: 1. Small, potentially acute or subacute infarcts in the right frontal lobe, right parietal lobe, and right basal ganglia. No hemorrhage. 2. ASPECTS is 8. 3. Mild chronic small vessel ischemic disease. These results were called by telephone at the time of interpretation on 01/26/2020 at 3:56 pm to Dr. Vladimir Crofts, who verbally acknowledged these results. Electronically Signed   By: Logan Bores M.D.   On: 01/26/2020 15:57      ASSESSMENT AND PLAN    -Multidisciplinary rounds held today  Acute blood loss anemia -upper GI bleed  -GI consult - Dr Alice Reichert - appreciate input - plan for EGD/C-scope -protonix 40 bid -unclear alcohol hx or variceal hx - empiric rocephin  -h/h improved - 1 more unit prbc ordered -need better access - have ordered Midline -    Acute hemorhagic shock - due to severe anemia secondary to UGIB -treat GI bleed  -minitor H/h q6h x 24h    Acute CVA  -  Neurology on case - appreciate input  -s/p CTH, MRI  unable to obtain - ppm    - repeat CTH -lipitor 80 ICU monitoring -asa in 48h -BP monitoring -neuro checks   Renal Failure-Acute KDIGO 3  -follow chem 7 -follow UO -continue Foley Catheter-assess need daily   ID -continue IV abx as prescibed -follow up cultures  GI/Nutrition GI PROPHYLAXIS as indicated DIET-->TF's as tolerated Constipation protocol as indicated  ENDO - ICU hypoglycemic\Hyperglycemia protocol -check FSBS per protocol   ELECTROLYTES -follow labs as needed -replace as needed -pharmacy consultation   DVT/GI PRX ordered -SCDs  TRANSFUSIONS AS NEEDED MONITOR FSBS ASSESS the need for LABS as needed   Critical care provider statement:    Critical care time (minutes):  109   Critical care time was exclusive of:  Separately billable procedures and treating other patients   Critical care was necessary to treat or prevent imminent or life-threatening deterioration of the following conditions:  acute CVA, hemorragic shock, Upper GI bleed, multiple comorbid conditions.   Critical care was time spent personally by me on the following activities:  Development of treatment plan with patient or surrogate, discussions with consultants, evaluation of patient's response to treatment, examination of patient, obtaining history from patient or surrogate, ordering and performing treatments and interventions, ordering and review of laboratory studies and re-evaluation of patient's condition.  I assumed direction of critical care for this patient from another provider in my specialty: no    This document was prepared using Dragon voice recognition software and may include unintentional dictation errors.    Ottie Glazier, M.D.  Division of [place]

## 2020-01-26 NOTE — ED Notes (Signed)
Returned from CT scan. Emergent blood ordered by MD. Patient now alert and oriented x2, increasing slurred speech while in CT. Neurology with patient throughout CT scan.

## 2020-01-26 NOTE — Progress Notes (Signed)
[name] paged for CODE STROKE in ED at approx. 3:30pm; when [place] arrived at pt.'s rm., pt. lying on bed w/neurologist performing evaluation, wife [name] at bedside.  [name] introduced himself to wife and sat w/her for an extended time while pt. was evaluated and treated.  Pt. very pale, having trouble moving left arm, having trouble seeing/recognizing people on his left side, initially complaining of feeling very cold.  CH and wife draped pt. w/blankets as RN gave pt. blood transfusion --> pt.'s color seemed to improve but his speaking seemed slurred when he spoke.  Wife says pt. has 'been going downhill' for about six months; she says he does not discuss his medical conditions w/her and puts medical paperwork/reports in a locked drawer after visits to MD.  Wife shared pt. has had several falls in the last few days and has been feeling very cold ('he's been wearing two wool coats inside').  Pt. has refused to go to the hospital or have EMS called, but after pt. fell on concrete floor in garage today, wife decided to call EMTs.  Wife reports pt. has been vomiting blood and possibly having blood in his stools; he also has been urinating in his pants and not telling her.  CH remained present throughout meetings w/doctors and medical team to assist wife in communicating questions, concerns.  Wife says she and pt. met in the fifth grade in [facility] and that pt. told one of his friends he knew he would marry her when she walked in the door of the classroom; they began dating at the end of their senior year of high school --> were married after wife finished college in [place].  Pt. served 30+ yrs. in [employer]; pt. and wife lived for many years in [place] until they moved back to [place] to take care of ailing parents.  They have a son and dtr. still in [place] but are estranged from dtr.  Wife says she will call son later tonight to update him of pt.'s admission to hospital.  At length, ICU Intensivist spoke w/pt. and wife re: need for  intubation to help pt.'s breathing and make endoscopy possible; wife verbalized approval for procedure, and pt. agreed after speaking w/Intensivist.  Wife grateful for [facility] presence and assistance.  [place] passed referral to [place] at end of shift for potential follow-up this evening.

## 2020-01-26 NOTE — ED Notes (Signed)
XR at bedside

## 2020-01-26 NOTE — ED Triage Notes (Signed)
Patient was brought in by [facility] after being found in garage by wife. Patient and wife are unsure for how long patient was there. Patient complains of left hip pain.

## 2020-01-26 NOTE — ED Notes (Signed)
2 units emergent blood completed. Patient A&OX3 at this time. Mild slurred speech. Wife at bedside

## 2020-01-26 NOTE — Consult Note (Addendum)
Neurology Consultation Reason for Consult: Left sided weakness and neglect RequestingPhysician: Vladimir Crofts  CC: shortness of breath  History is obtained from: Patient, wife and chart review  HPI: Eric Kennedy is a 85 y.o. male with past medical history significant for complete heart block status post pacemaker, potential atrial fibrillation not on anticoagulation, coronary artery disease, hypertension, hyperlipidemia, chronic kidney disease, moderate symptomatic mitral valve stenosis, diabetes, hypertension, hyperlipidemia, hypothyroidism, peripheral vascular disease.  Of note, he was evaluated for shortness of breath on 12/28/2019 which he reported had been increasing in severity over the last 4 months occurring with moderate exertion and alleviated by resting, associated with fatigue.  Stress test, carotid ultrasound, and echo were planned to work-up his symptoms.  Reliability of history from his wife is unclear, for example she reported that he was in his 34s but he accurately reported he is 85 years old.  There was a great deal of difficulty in ascertaining his last known normal, and essentially seems that he has been having a gradual decline for weeks to months.  She reports he is secretive about his medical conditions which also limits her ability to give history.  However she has noted that he has been very short of breath for the last few weeks, has been pale, with poor appetite and fatigue.  She thinks he might have been vomiting bloody bright red emesis that she has seen in the toilet though he has denied emesis to her.  In the last few days he has been very inactive and complained of being cold which is not his baseline.  She is not sure how long he has been unable to look over towards the left.  However she notes that the abrasion on his left elbow was from a fall actually 3 days prior  ED course: Head CT in the ED revealed multifocal hypodensities with likely acute on chronic strokes  though difficult to be certain given imaging modality and no prior head imaging to compare to in our system.  MRI brain was considered but cannot be completed given patient's pacemaker.  Also patient was hemodynamically unstable and tachypneic.  On placement of OG tube after intubation, bloody stomach contents were returned.  Patient's blood pressures did improve after administration of 3 units of blood.  Due to reduced responsiveness and need for EGD, patient was intubated.  Post intubation with sedation paused, neurological exam was not concerning for dramatic worsening.  LKW: 3 days - 3 weeks prior  tPA given?: No, due to acute anemia and unclear LKW Premorbid modified rankin scale:      1 - No significant disability. Able to carry out all usual activities, despite some symptoms.  NIH stroke scale: 14 1 point for being drowsy 1 point for incorrect month 2 points for left facial droop 4 points for no movement of the left arm 3 points for no effort against gravity of the left leg 1 point for loss of sensation on the left relative to the right 1 point for dysarthria 1 point for neglect  ROS: Limited by shortness of breath   Past Medical History:  Diagnosis Date  . Diabetes mellitus without complication ([place])   . Hypertension   -Multiple additional problems as listed above in HPI  Past Surgical History:  Procedure Laterality Date  . CARDIAC SURGERY     triple bypass   Current Outpatient Medications on File Prior to Visit (12/28/2019 office note) Medication Sig Dispense Refill  . rosuvastatin (CRESTOR) 20 MG tablet  Take 1 tablet (20 mg total) by mouth once daily 90 tablet 11  . amLODIPine (NORVASC) 10 MG tablet TAKE 1 TABLET DAILY 90 tablet 1  . aspirin 81 MG EC tablet Take 81 mg by mouth once daily  . levothyroxine (SYNTHROID) 50 MCG tablet Take 50 mcg by mouth Take six days per week. None on Sundays.  . multivitamin-lutein (MULTIVITAMIN 50 PLUS) tablet Take 1 tablet by mouth once  daily.  Marland Kitchen omeprazole (PRILOSEC) 40 MG DR capsule Take 1 capsule (40 mg total) by mouth once daily 90 capsule 4  . telmisartan (MICARDIS) 80 MG tablet TAKE 1 TABLET DAILY 90 tablet 3     Family History  Problem Relation Age of Onset  . Diabetes Mother   . Cancer Brother    Social History:  reports that he has quit smoking. He has never used smokeless tobacco. He reports that he does not drink alcohol and does not use drugs.   Exam: Current vital signs: BP (!) 124/53   Pulse 95   Temp (!) 96.9 F (36.1 C) (Rectal)   Resp 19   Ht 5\' 8"  (1.727 m)   Wt 74.8 kg   SpO2 100%   BMI 25.09 kg/m  Vital signs in last 24 hours: Temp:  [96.9 F (36.1 C)] 96.9 F (36.1 C) (11/19 1618) Pulse Rate:  [34-99] 95 (11/19 1845) Resp:  [19-29] 19 (11/19 1845) BP: (76-124)/(43-84) 124/53 (11/19 1845) SpO2:  [87 %-100 %] 100 % (11/19 1845) FiO2 (%):  [40 %] 40 % (11/19 1815) Weight:  [74.8 kg] 74.8 kg (11/19 1505)   Physical Exam  Constitutional: Pale and chronically ill-appearing Psych: Affect appropriate to situation, flat Eyes: No scleral injection HENT: No OP obstruction, dentures in place MSK: no joint deformities, blue nailbeds, stable for several weeks per wife but not chronic Cardiovascular: Normal rate and regular rhythm.  Respiratory: Extremely short of breath GI: Soft.  No distension. There is no tenderness.  Skin: Abrasions on the left elbow and leg  Neuro: Mental Status: Patient is awake, alert, oriented to person, place, and situation but not month; history limited due to shortness of breath Neglecting the left side Cranial Nerves: II: Visual Fields are notable for a likely left visual field cut by blink to threat. Pupils are equal, round, and reactive to light.   III,IV, VI: EOMI with left gaze preference but does cross over to the right, without posis or diploplia.  V: Facial sensation is symmetric to eyelash brush VII: Facial movement is notable for left facial  droop VIII: hearing is intact to voice X: Uvula difficult to visualize XI: Shoulder shrug not tested  XII: tongue is midline without atrophy or fasciculations.  Sensory/motor: Tone is slightly increased in the left upper extremity, and the patient winces to noxious stimulation but does not move the left upper extremity.  Left lower extremity is 2-3/5, right side right lower extremity can maintain antigravity greater than 5 seconds, right upper extremity has no pronator drift.  He appears to be more responsive to touch on the right than the left Deep Tendon Reflexes: Slightly increased on the right compared to left Cerebellar: Finger-to-nose is intact on the right, toe to hand is intact bilaterally  I have reviewed labs in epic and the results pertinent to this consultation are: Troponin of 1839 Hemoglobin 4.7 White blood cell count 10.9 INR 1.5 Creatinine 2.01, GFR 32 (baseline 1.4 one year ago)  I have reviewed the images obtained:  Head CT with  multifocal age-indeterminate hypodensities; I am unable to personally compared to prior but on radiology report there are several new hypodensities since his last head CT in 2019  Impression: This is an 85 year old gentleman presenting with acute symptomatic anemia likely leading to multifocal strokes secondary to his known carotid stenosis causing watershed infarcts.  Another possibility is embolic strokes secondary to atrial fibrillation not on anticoagulation.  His waxing and waning exam may be secondary to his symptomatic anemia in combination with the carotid stenosis.  Patient was felt to be too unstable would last known well too unclear to be a candidate for interventional radiology.  Unfortunately MRI could not be obtained to clarify chronicity of his symptoms due to pacemaker.  Contrast load for CTA/CTP was felt to carry significant risk of permanent renal injury in the setting of his AKI and ongoing GI bleeding with low likelihood of benefit  given that he had had a fall towards his left side even 3 days ago.  Major contributing issues  - Known carotid stenosis - Afib not on AC - Likely AKI on CKD - CAD s/p pacemaker - Symptomatic anemia, c/f GI bleed  Recommendations:  # Acute ischemic strokes in the setting of GI bleed and known carotid stenosis - Stroke labs HgbA1c, fasting lipid panel - Repeat HCT now given neurological worsening - Frequent neuro checks, q2hr overnight - Echocardiogram - Carotid dopplers - If no hemorrhage on repeat HCT, start ASA as soon as able from GI bleed  - Consider Plavix 300 mg load with 75 mg daily for 21 - 90 day course pending GI bleed stabilization - Risk factor modification - Telemetry monitoring; no need for event monitor on discharge given known diagnosis of atrial fibrillation - Blood pressure goal -- normotension, please maintain MAP 65-100 (ideally closer to 75, but pressors only for MAP < 65) - Appreciate GI following to identify and treat source of bleeding - Appreciate management of other issues per CCM - PT consult, OT consult, Speech consult,  - Neurology to follow  Lesleigh Noe MD-PhD [facility] [phone]  This patient is critically ill with symptomatic GI bleeding in addition to acute/subacute neurological deficits, with significant challenges to gathering history as above.  I frequently reexamined the patient during his ED course, spending a total of 95 minutes in critical care time with this patient, over half of which was at bedside, including discussions with GI, critical care medicine, ED provider, patient, wife, and nursing staff.

## 2020-01-26 NOTE — ED Notes (Signed)
ED Provider at bedside. 

## 2020-01-26 NOTE — ED Provider Notes (Signed)
[facility] Emergency Department Provider Note ____________________________________________   First MD Initiated Contact with Patient 01/26/20 1455     (approximate)  I have reviewed the triage vital signs and the nursing notes.  HISTORY  Chief Complaint Fall   HPI Eric Kennedy is a 85 y.o. malewho presents to the ED for evaluation of a fall.   Chart review indicates hx HTN, DM, HLD.  CKD.  Hypothyroidism Hx complete heart block s/p pacer/ICD placement, CAD Follows with Dr. Nehemiah Massed with [facility] cardiology, last seen in the clinic 1 month ago.  Normal pacer interrogation without need for reprogramming.  Patient presents to the ED via EMS after reported fall when patient was found in his garage at home by his wife.  They are uncertain of how long he was laying there.  Patient reporting left-sided hip pain his only complaint, such intensity, constant and aching in nature.  History limited by patient's disorientation and amnesia of the events.  My provides additional history when she arrives at the bedside minutes later.  She indicates that patient to be going downhill for 4-6 weeks in a generalized fashion.  She reports that he is "bull headed" and has refused to seek medical evaluation despite her urging him to do so.  She reports that they both attended a senior citizen events 3 days ago where he was ambulatory.  Wife reports that patient was able to hold the paper this morning.  Past Medical History:  Diagnosis Date  . Diabetes mellitus without complication ([place])   . Hypertension     Patient Active Problem List   Diagnosis Date Noted  . Complete heart block ([place]) 06/16/2018  . Healthcare maintenance 06/06/2018  . Controlled type 2 diabetes mellitus with complication, without long-term current use of insulin ([place]) 12/02/2017  . Hypertension, well controlled 12/02/2017  . Chronic anemia 06/01/2017  . 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 Vitamin B 12  deficiency 11/26/2015  . Pure hypercholesterolemia 08/27/2015  . Severe aortic valve stenosis 06/18/2015  . Acquired hypothyroidism 02/14/2014  . Adhesive capsulitis of left shoulder 08/25/2013  . 3-vessel CAD 05/16/2013  . Hx of CABG 05/16/2013  . Carotid stenosis 05/16/2013  . Paroxysmal atrial fibrillation ([place]) 05/16/2013    Prior to Admission medications   Medication Sig Start Date End Date Taking? Authorizing Provider  amLODipine (NORVASC) 10 MG tablet Take 10 mg by mouth daily.    Yes [provider]  aspirin EC 81 MG tablet Take 81 mg by mouth daily.    Yes [provider]  levothyroxine (SYNTHROID) 50 MCG tablet Take 50 mcg by mouth daily.    Yes [provider]  Multiple Vitamins-Minerals (PX COMPLETE SENIOR MULTIVITS) TABS Take 1 tablet by mouth daily.    Yes [provider]  omeprazole (PRILOSEC) 40 MG capsule Take 40 mg by mouth daily.    Yes [provider]  rosuvastatin (CRESTOR) 40 MG tablet Take 40 mg by mouth daily.    Yes [provider]  telmisartan (MICARDIS) 80 MG tablet Take 80 mg by mouth daily.    Yes [provider]    Allergies Patient has no known allergies.  Family History  Problem Relation Age of Onset  . Diabetes Mother   . Cancer Brother     Social History Social History   Tobacco Use  . Smoking status: Former Research scientist (life sciences)  . Smokeless tobacco: Never Used  Substance Use Topics  . Alcohol use: Never  . Drug use: Never  Review of Systems  History is limited due to patient's disorientation, acuity and amnesia to the events today ____________________________________________   PHYSICAL EXAM:  VITAL SIGNS: Vitals:   01/26/20 1700 01/26/20 1700  BP: (!) 104/59   Pulse: 95   Resp: (!) 26   Temp:    SpO2: 90% (!) 89%     Constitutional: Alert and oriented to person, time and location.  Follows commands. Tachypneic. Eyes: Conjunctivae are pale. PERRL. EOMI. Head:  Atraumatic. Nose: No congestion/rhinnorhea. Mouth/Throat: Mucous membranes are pale and dry.  Oropharynx non-erythematous. Neck: No stridor. No cervical spine tenderness to palpation. Cardiovascular: Normal rate, regular rhythm. Grossly normal heart sounds.  Good peripheral circulation. Respiratory: Tachypneic with faint bibasilar crackles, otherwise clear lungs. Gastrointestinal: Soft , nondistended.  Musculoskeletal: Superficial skin tear to the left elbow without discrete laceration. Left lower extremity is externally rotated and shortened compared to the right.  LLE is distally neurovascularly intact with a strong DP pulse symmetric to the right, brisk CRT and patient is able to wiggle his toes in the left. Neurologic: Left-sided hemineglect.  Mild facial asymmetry.  Left arm 0/5 strength.  Left leg 2/5 strength.  5/5 strength to right upper and lower extremities. Skin:  Skin is cool, pale and dry. No rash noted. Psychiatric: Mood and affect are normal. Speech and behavior are normal.  ____________________________________________   LABS (all labs ordered are listed, but only abnormal results are displayed)  Labs Reviewed  CBC WITH DIFFERENTIAL/PLATELET - Abnormal; Notable for the following components:      Result Value   WBC 10.9 (*)    RBC 2.69 (*)    Hemoglobin 4.7 (*)    HCT 20.0 (*)    MCV 74.3 (*)    MCH 17.5 (*)    MCHC 23.5 (*)    RDW 22.3 (*)    nRBC 0.4 (*)    Neutro Abs 9.3 (*)    Abs Immature Granulocytes 0.10 (*)    All other components within normal limits  COMPREHENSIVE METABOLIC PANEL - Abnormal; Notable for the following components:   CO2 11 (*)    Glucose, Bld 136 (*)    Creatinine, Ser 2.01 (*)    Total Protein 6.2 (*)    Albumin 3.4 (*)    AST 57 (*)    Total Bilirubin 1.9 (*)    GFR, Estimated 32 (*)    Anion gap 20 (*)    All other components within normal limits  PROTIME-INR - Abnormal; Notable for the following components:   Prothrombin Time  17.4 (*)    INR 1.5 (*)    All other components within normal limits  TROPONIN I (HIGH SENSITIVITY) - Abnormal; Notable for the following components:   Troponin I (High Sensitivity) 37 (*)    All other components within normal limits  MAGNESIUM  ETHANOL  APTT  URINALYSIS, COMPLETE (UACMP) WITH MICROSCOPIC  URINE DRUG SCREEN, QUALITATIVE ([facility] ONLY)  URINALYSIS, ROUTINE W REFLEX MICROSCOPIC  TYPE AND SCREEN  ABO/RH  PREPARE RBC (CROSSMATCH)  PREPARE RBC (CROSSMATCH)  TROPONIN I (HIGH SENSITIVITY)   ____________________________________________  12 Lead EKG  Poor quality EKG with wandering baseline. Sinus rhythm. Leftward axis. Wide QRS. Lateral ST depressions without clear STEMI criteria. ____________________________________________  RADIOLOGY  ED MD interpretation: CT head reviewed by me with evidence of right frontal acute infarcts.  Official radiology report(s): DG Chest 1 View  Result Date: 01/26/2020 CLINICAL DATA:  85 year old male found down. EXAM: CHEST  1 VIEW COMPARISON:  None.  FINDINGS: Portable AP supine view at 1644 hours. Prior sternotomy, cardiac valve replacement. Left chest dual lead cardiac pacemaker. Cardiac size and mediastinal contours are within normal limits. Mildly low lung volumes. Asymmetric left lateral and costophrenic angle pleural density. Mild associated patchy peripheral opacity in the left lung. Mild streaky opacity in the left mid lung more resembling atelectasis or scarring. Allowing for portable technique the right lung is clear. No pneumothorax is evident. No rib fracture is identified. Grossly intact other visible osseous structures. Negative visible bowel gas pattern. IMPRESSION: 1. Suspicion of a small left pleural effusion, and patchy nonspecific opacity in the left mid lung. No adjacent rib fracture is identified radiographically. 2. No other acute cardiopulmonary abnormality. Electronically Signed   By: Genevie Ann M.D.   On: 01/26/2020 17:18    DG Pelvis 1-2 Views  Result Date: 01/26/2020 CLINICAL DATA:  85 year old male found down. EXAM: PELVIS - 1-2 VIEW COMPARISON:  None. FINDINGS: Portable AP supine view at 1643 hours. Femoral heads are normally located. Hip joint spaces are normal for age. The pelvis appears intact. Grossly intact proximal femurs. Calcified femoral artery atherosclerosis. Oval circumscribed probable calcified phlebolith in the right central pelvis. Negative visible bowel gas pattern. IMPRESSION: No acute fracture or dislocation identified about the pelvis. Electronically Signed   By: Genevie Ann M.D.   On: 01/26/2020 17:19   CT HEAD CODE STROKE WO CONTRAST  Result Date: 01/26/2020 CLINICAL DATA:  Code stroke. Left-sided collect and upper extremity weakness. EXAM: CT HEAD WITHOUT CONTRAST TECHNIQUE: Contiguous axial images were obtained from the base of the skull through the vertex without intravenous contrast. COMPARISON:  01/03/2018 head and neck CTA FINDINGS: The study is mildly motion degraded despite repeat imaging. Brain: There are small cortical infarcts in the posterior right frontal lobe (precentral gyrus extending into the centrum semiovale) and right parietal lobe which are new from the CT and of indeterminate age though may be acute or subacute. There is also a new age indeterminate lacunar infarct in the right caudate head. No acute intracranial hemorrhage, mass, midline shift, or extra-axial fluid collection is identified. There is mild cerebral atrophy. Hypodensities in the cerebral white matter bilaterally are nonspecific but compatible with mild chronic small vessel ischemic disease. Vascular: Calcified atherosclerosis at the skull base. No hyperdense vessel. Skull: No fracture or suspicious osseous lesion. Sinuses/Orbits: Chronic right sphenoid sinusitis. Small chronic right mastoid effusion. Bilateral cataract extraction. Other: None. ASPECTS [facility] Stroke Program Early CT Score) - Ganglionic level  infarction (caudate, lentiform nuclei, internal capsule, insula, M1-M3 cortex): 6 - Supraganglionic infarction (M4-M6 cortex): 2 Total score (0-10 with 10 being normal): 8 IMPRESSION: 1. Small, potentially acute or subacute infarcts in the right frontal lobe, right parietal lobe, and right basal ganglia. No hemorrhage. 2. ASPECTS is 8. 3. Mild chronic small vessel ischemic disease. These results were called by telephone at the time of interpretation on 01/26/2020 at 3:56 pm to Dr. Vladimir Crofts, who verbally acknowledged these results. Electronically Signed   By: Logan Bores M.D.   On: 01/26/2020 15:57    ____________________________________________   PROCEDURES and INTERVENTIONS  Procedure(s) performed (including Critical Care):  .Critical Care Performed by: Vladimir Crofts, MD Authorized by: Vladimir Crofts, MD   Critical care provider statement:    Critical care time (minutes):  100   Critical care was necessary to treat or prevent imminent or life-threatening deterioration of the following conditions:  Circulatory failure, CNS failure or compromise and shock   Critical care was time  spent personally by me on the following activities:  Discussions with consultants, evaluation of patient's response to treatment, examination of patient, ordering and performing treatments and interventions, ordering and review of laboratory studies, ordering and review of radiographic studies, pulse oximetry, re-evaluation of patient's condition, obtaining history from patient or surrogate and review of old charts  .1-3 Lead EKG Interpretation Performed by: Vladimir Crofts, MD Authorized by: Vladimir Crofts, MD     Interpretation: non-specific     ECG rate:  90   ECG rate assessment: normal     Rhythm: paced     Ectopy: none      Medications  pantoprazole (PROTONIX) 80 mg in sodium chloride 0.9 % 100 mL IVPB (80 mg Intravenous New Bag/Given 01/26/20 1709)  pantoprazole (PROTONIX) 80 mg in sodium chloride 0.9 %  100 mL (0.8 mg/mL) infusion (has no administration in time range)  pantoprazole (PROTONIX) injection 40 mg (has no administration in time range)  0.9 %  sodium chloride infusion (Manually program via Guardrails IV Fluids) (has no administration in time range)    ____________________________________________   MDM / ED COURSE   85 year old male on ASA 61 presents from home after reported fall, requiring stroke alert activation due to new left-sided weakness, found to have significant anemia requiring blood transfusion and ICU admission.  Patient arrives after reported fall, but I am most concerned about an acute stroke and so activate stroke alert.  He does have evidence of multifocal infarcts,, which are likely watershed in etiology from his acute anemia and known carotid stenosis bilaterally.  Once his blood work returns with hemoglobin of 4.7, his clinical picture becomes more clear.  I am concerned about an upper GI bleed as his fundamental pathology, causing severe anemia and subsequent watershed stroke which precipitated his fall today.  Wife reports hematemesis at home, and he is Hemoccult negative on rectal exam.  No other stigmata or sources of bleeding identified.  I spoke with GI, who recommends further resuscitation prior to endoscopy.  We will admit the patient to the ICU for further resuscitation in conjunction with GI and neurology on board.    Clinical Course as of Jan 25 1721  Fri Jan 26, 2020  1533 Neurology at the bedside.   [DS]  1660 Hemoglobin returning at 4.7.    [DS]  6301 Call from rads.  New small cortical infarcts. No bleeds   [DS]  6010 Reassessed.  Educated nurses on need for 2 units of uncrossed matched blood.  Order 2 units and they are running to blood bank to grab this.  Maps in the mid to upper 60s.  Heart rate in the 90s.   [DS]  1609 Talking it over with neurology about CTA vs Mri next   [DS]  9323 Reassessed.  Map of 64.  Blood is hanging and about to be  infused.   [DS]  5573 Small amount of light brown stool that is Hemoccult negative.  Wife is now indicating that patient has had hematemesis of the past 1 week.  She cannot elaborate on frequency   [DS]  1629 Protonix load and drip Needs further resuscitation before scope.  Spoke with Dr. Alice Reichert and we review patient's presentation. He will come see patient for GI consult   [DS]  1632 ICU paged   [DS]  1705 Spoke with Dr. Lanney Gins who will see the patient for ICU admission   [DS]    Clinical Course User Index [DS] Vladimir Crofts, MD  ____________________________________________   FINAL CLINICAL IMPRESSION(S) / ED DIAGNOSES  Final diagnoses:  Cerebral ischemic stroke due to global hypoperfusion with watershed infarct [facility])  Microcytic anemia  Hemorrhagic shock ([place])  Fall, initial encounter  Upper GI bleed     ED Discharge Orders    None       Nysir Fergusson   Note:  This document was prepared using Dragon voice recognition software and may include unintentional dictation errors.   Vladimir Crofts, MD 01/26/20 1726

## 2020-01-26 NOTE — Progress Notes (Signed)
CODE STROKE- PHARMACY COMMUNICATION   Time CODE STROKE called/page received:1325  Time response to CODE STROKE was made in person  Time Stroke Kit retrieved from [place] (only if needed):N/A  Name of Provider/Nurse contacted:Dr Towana Badger, RN  Past Medical History:  Diagnosis Date  . Diabetes mellitus without complication ([place])   . Hypertension    Prior to Admission medications   Medication Sig Start Date End Date Taking? Authorizing Provider  amLODipine (NORVASC) 10 MG tablet Take by mouth. 06/16/18 06/16/19  [provider]  aspirin EC 81 MG tablet Take by mouth.    [provider]  levothyroxine (SYNTHROID) 25 MCG tablet Take by mouth. 02/26/16   [provider]  metoprolol succinate (TOPROL-XL) 25 MG 24 hr tablet Take by mouth. 08/20/16   [provider]  Multiple Vitamins-Minerals (PX COMPLETE SENIOR MULTIVITS) TABS Take by mouth.    [provider]  omeprazole (PRILOSEC) 40 MG capsule Take by mouth. 12/05/18   [provider]  rosuvastatin (CRESTOR) 40 MG tablet Take by mouth. 12/10/15   [provider]  telmisartan (MICARDIS) 80 MG tablet Take by mouth. 12/21/18 12/21/19  [provider]   Renda Rolls, PharmD, [facility] 01/26/2020 4:50 PM

## 2020-01-26 NOTE — Consult Note (Signed)
[facility] Inpatient Consult Note   Kathline Magic, M.D.  Reason for Consult: Symptomatic anemia, presumed UGI bleed   Attending Requesting Consult: Vladimir Crofts M.D. (ED)   History of Present Illness: Eric Kennedy is a 85 y.o. male presenting urgently to the emergency room after being found collapsed in his garage by the his wife..  Patient head CT revealed watershed infarcts in the right frontal, right parietal and right basal ganglia.  Patient's wife reportedly told Dr. Tamala Julian that patient has had several episodes of hematemesis this week.  Evaluation in the emergency room revealed no acute hemodynamic changes, patient not in pain.  Stool Hemoccult negative. Patient has multiple medical problems including Type II DM, Severe CAD s/p 3v CABG, complete heart block, chronic anemia, hypothyroidism, PAF and Carotid stenosis. Currently he is in moderate respiratory distress, but is alert and oriented. His wife, who accompanies him, appears to be confused and is a poor historian. Review of chart reveals he has a hx of GERD and takes Omeprazole 40mg  daily.He takes baby ASA daily but no prescription anticoagulants.  Past Medical History:  Past Medical History:  Diagnosis Date  . Diabetes mellitus without complication ([place])   . Hypertension     Problem List: Patient Active Problem List   Diagnosis Date Noted  . Complete heart block ([place]) 06/16/2018  . Healthcare maintenance 06/06/2018  . Controlled type 2 diabetes mellitus with complication, without long-term current use of insulin ([place]) 12/02/2017  . Hypertension, well controlled 12/02/2017  . Chronic anemia 06/01/2017  . 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 Vitamin B 12 deficiency 11/26/2015  . Pure hypercholesterolemia 08/27/2015  . Severe aortic valve stenosis 06/18/2015  . Acquired hypothyroidism 02/14/2014  . Adhesive capsulitis of left shoulder 08/25/2013  . 3-vessel CAD 05/16/2013  . Hx of CABG 05/16/2013  . Carotid  stenosis 05/16/2013  . Paroxysmal atrial fibrillation ([place]) 05/16/2013    Past Surgical History: Past Surgical History:  Procedure Laterality Date  . CARDIAC SURGERY     triple bypass    Allergies: No Known Allergies  Home Medications: (Not in a hospital admission)  Home medication reconciliation was completed with the patient.   Scheduled Inpatient Medications:   . [START ON 01/30/2020] pantoprazole  40 mg Intravenous Q12H    Continuous Inpatient Infusions:   . pantoprozole (PROTONIX) infusion    . pantoprazole (PROTONIX) 80 mg IVPB      PRN Inpatient Medications:    Family History: family history includes Cancer in his brother; Diabetes in his mother.   GI Family History: Negative  Social History:   reports that he has quit smoking. He has never used smokeless tobacco. He reports that he does not drink alcohol and does not use drugs. The patient denies ETOH, tobacco, or drug use.    Review of Systems: Review of Systems - History obtained from the patient and spouse General ROS: positive for  - fatigue negative for - sleep disturbance ENT ROS: negative Allergy and Immunology ROS: negative Hematological and Lymphatic ROS: positive for - blood clots and pallor negative for - swollen lymph nodes Respiratory ROS: positive for - shortness of breath negative for - cough, pleuritic pain, tachypnea or wheezing Cardiovascular ROS: positive for - dyspnea on exertion negative for - chest pain Musculoskeletal ROS: negative Neurological ROS: positive for - dizziness, impaired coordination/balance and weakness negative for - bowel and bladder control changes Dermatological ROS: negative  Physical Examination: BP (!) 124/43   Pulse 93   Temp (!)  96.9 F (36.1 C) (Rectal)   Resp (!) 29   Ht 5\' 8"  (1.727 m)   Wt 74.8 kg   SpO2 92%   BMI 25.09 kg/m  Physical Exam Vitals reviewed.  Constitutional:      General: He is in acute distress.     Appearance: He is  ill-appearing.  HENT:     Head: Normocephalic and atraumatic.  Eyes:     Conjunctiva/sclera: Conjunctivae normal.     Pupils: Pupils are equal, round, and reactive to light.  Cardiovascular:     Rate and Rhythm: Tachycardia present.     Pulses: Normal pulses.     Heart sounds: No gallop.   Pulmonary:     Effort: Respiratory distress present.     Breath sounds: No wheezing or rales.  Chest:     Chest wall: No tenderness.  Abdominal:     General: Abdomen is flat.     Palpations: Abdomen is soft. There is no mass.     Tenderness: There is no abdominal tenderness. There is no rebound.  Musculoskeletal:        General: No swelling or tenderness.  Skin:    Coloration: Skin is pale.  Neurological:     Mental Status: He is alert.     Motor: Weakness present.     Data: Lab Results  Component Value Date   WBC 10.9 (H) 01/26/2020   HGB 4.7 (LL) 01/26/2020   HCT 20.0 (L) 01/26/2020   MCV 74.3 (L) 01/26/2020   PLT 286 01/26/2020   Recent Labs  Lab 01/26/20 1532  HGB 4.7*   Lab Results  Component Value Date   NA 140 01/26/2020   K 4.6 01/26/2020   CL 109 01/26/2020   CO2 11 (L) 01/26/2020   BUN 19 01/26/2020   CREATININE 2.01 (H) 01/26/2020   Lab Results  Component Value Date   ALT 19 01/26/2020   AST 57 (H) 01/26/2020   ALKPHOS 81 01/26/2020   BILITOT 1.9 (H) 01/26/2020   Recent Labs  Lab 01/26/20 1532  APTT 28  INR 1.5*   CBC Latest Ref Rng & Units 01/26/2020 04/13/2011  WBC 4.0 - 10.5 K/uL 10.9(H) 4.9  Hemoglobin 13.0 - 17.0 g/dL 4.7(LL) 13.4  Hematocrit 39 - 52 % 20.0(L) 40.4  Platelets 150 - 400 K/uL 286 192    STUDIES: CT HEAD CODE STROKE WO CONTRAST  Result Date: 01/26/2020 CLINICAL DATA:  Code stroke. Left-sided collect and upper extremity weakness. EXAM: CT HEAD WITHOUT CONTRAST TECHNIQUE: Contiguous axial images were obtained from the base of the skull through the vertex without intravenous contrast. COMPARISON:  01/03/2018 head and neck CTA  FINDINGS: The study is mildly motion degraded despite repeat imaging. Brain: There are small cortical infarcts in the posterior right frontal lobe (precentral gyrus extending into the centrum semiovale) and right parietal lobe which are new from the CT and of indeterminate age though may be acute or subacute. There is also a new age indeterminate lacunar infarct in the right caudate head. No acute intracranial hemorrhage, mass, midline shift, or extra-axial fluid collection is identified. There is mild cerebral atrophy. Hypodensities in the cerebral white matter bilaterally are nonspecific but compatible with mild chronic small vessel ischemic disease. Vascular: Calcified atherosclerosis at the skull base. No hyperdense vessel. Skull: No fracture or suspicious osseous lesion. Sinuses/Orbits: Chronic right sphenoid sinusitis. Small chronic right mastoid effusion. Bilateral cataract extraction. Other: None. ASPECTS [facility] Stroke Program Early CT Score) - Ganglionic level infarction (caudate,  lentiform nuclei, internal capsule, insula, M1-M3 cortex): 6 - Supraganglionic infarction (M4-M6 cortex): 2 Total score (0-10 with 10 being normal): 8 IMPRESSION: 1. Small, potentially acute or subacute infarcts in the right frontal lobe, right parietal lobe, and right basal ganglia. No hemorrhage. 2. ASPECTS is 8. 3. Mild chronic small vessel ischemic disease. These results were called by telephone at the time of interpretation on 01/26/2020 at 3:56 pm to Dr. Vladimir Crofts, who verbally acknowledged these results. Electronically Signed   By: Logan Bores M.D.   On: 01/26/2020 15:57   @IMAGES @  Assessment: 1. Ischemic stroke, Right MCA distribution. 2. Marked anemia, presumably from GI blood loss. Hemoccult negative. Recent history of hemetemesis. NO observed GI bleeding in the ED thus far. 3. Mild hypotension, possibly from hypovolemia. 4. Severe 3v CAD. 5. History of Paroxysmal atrial fibrillation.  58. Advanced age,  full code status.   COVID-19 status: Tested negative  Recommendations: 1. Neuro/ICU admission. 2. Pantoprazole IV 80mg  followed by 8mg /hr gtt. 3. Transfuse to Hgb over 8. 4. EGD when clinically feasible. The patient understands the nature of the planned procedure, indications, risks, alternatives and potential complications including but not limited to bleeding, infection, perforation, damage to internal organs and possible oversedation/side effects from anesthesia. The patient agrees and gives consent to proceed.  Please refer to procedure notes for findings, recommendations and patient disposition/instructions.   Thank you for the consult. Please call with questions or concerns.  Olean Ree, "Lanny Hurst MD [facility] [place], [place] [address] [phone]  01/26/2020 4:41 PM

## 2020-01-26 NOTE — ED Notes (Signed)
Wife at bedside.

## 2020-01-26 NOTE — ED Notes (Signed)
Attempted to call report to ICU, RN unavailable. Neuro at bedside to reassess patient.

## 2020-01-27 ENCOUNTER — Inpatient Hospital Stay
Admit: 2020-01-27 | Discharge: 2020-01-27 | Disposition: A | Discharge status: Home / Self Care | Payer: Medicare Other | Attending: Neurology | Admitting: Neurology

## 2020-01-27 ENCOUNTER — Inpatient Hospital Stay: Payer: Medicare Other

## 2020-01-27 ENCOUNTER — Encounter: Payer: Self-pay | Admitting: Certified Registered"

## 2020-01-27 DIAGNOSIS — I63512 Cerebral infarction due to unspecified occlusion or stenosis of left middle cerebral artery: Secondary | ICD-10-CM

## 2020-01-27 LAB — BASIC METABOLIC PANEL
Anion gap: 11 (ref 5–15)
BUN: 25 mg/dL — ABNORMAL HIGH (ref 8–23)
CO2: 21 mmol/L — ABNORMAL LOW (ref 22–32)
Calcium: 9.1 mg/dL (ref 8.9–10.3)
Chloride: 109 mmol/L (ref 98–111)
Creatinine, Ser: 2.14 mg/dL — ABNORMAL HIGH (ref 0.61–1.24)
GFR, Estimated: 30 mL/min — ABNORMAL LOW (ref >60)
Glucose, Bld: 93 mg/dL (ref 70–99)
Potassium: 4.1 mmol/L (ref 3.5–5.1)
Sodium: 141 mmol/L (ref 135–145)

## 2020-01-27 LAB — BLOOD GAS, ARTERIAL
Acid-base deficit: 4.4 mmol/L — ABNORMAL HIGH (ref 0.0–2.0)
Bicarbonate: 20 mmol/L (ref 20.0–28.0)
FIO2: 0.3
MECHVT: 500 mL
Mechanical Rate: 16
O2 Saturation: 97.1 %
PEEP: 5 cmH2O
Patient temperature: 37
pCO2 arterial: 33 mmHg (ref 32.0–48.0)
pH, Arterial: 7.39 (ref 7.350–7.450)
pO2, Arterial: 93 mmHg (ref 83.0–108.0)

## 2020-01-27 LAB — PROTIME-INR
INR: 1.4 — ABNORMAL HIGH (ref 0.8–1.2)
Prothrombin Time: 16.2 seconds — ABNORMAL HIGH (ref 11.4–15.2)

## 2020-01-27 LAB — PHOSPHORUS: Phosphorus: 4.9 mg/dL — ABNORMAL HIGH (ref 2.5–4.6)

## 2020-01-27 LAB — TROPONIN I (HIGH SENSITIVITY)
Troponin I (High Sensitivity): 11895 ng/L (ref <18)
Troponin I (High Sensitivity): 12751 ng/L (ref <18)
Troponin I (High Sensitivity): 12867 ng/L (ref <18)

## 2020-01-27 LAB — CBC
HCT: 27.6 % — ABNORMAL LOW (ref 39.0–52.0)
Hemoglobin: 8.5 g/dL — ABNORMAL LOW (ref 13.0–17.0)
MCH: 23 pg — ABNORMAL LOW (ref 26.0–34.0)
MCHC: 30.8 g/dL (ref 30.0–36.0)
MCV: 74.8 fL — ABNORMAL LOW (ref 80.0–100.0)
Platelets: 153 10*3/uL (ref 150–400)
RBC: 3.69 MIL/uL — ABNORMAL LOW (ref 4.22–5.81)
RDW: 21.2 % — ABNORMAL HIGH (ref 11.5–15.5)
WBC: 9.6 10*3/uL (ref 4.0–10.5)
nRBC: 0.2 % (ref 0.0–0.2)

## 2020-01-27 LAB — HEMOGLOBIN AND HEMATOCRIT, BLOOD
HCT: 27.2 % — ABNORMAL LOW (ref 39.0–52.0)
HCT: 28.3 % — ABNORMAL LOW (ref 39.0–52.0)
HCT: 28.5 % — ABNORMAL LOW (ref 39.0–52.0)
HCT: 29.1 % — ABNORMAL LOW (ref 39.0–52.0)
Hemoglobin: 8.2 g/dL — ABNORMAL LOW (ref 13.0–17.0)
Hemoglobin: 8.6 g/dL — ABNORMAL LOW (ref 13.0–17.0)
Hemoglobin: 8.6 g/dL — ABNORMAL LOW (ref 13.0–17.0)
Hemoglobin: 8.6 g/dL — ABNORMAL LOW (ref 13.0–17.0)

## 2020-01-27 LAB — LIPID PANEL
Cholesterol: 47 mg/dL (ref 0–200)
HDL: 28 mg/dL — ABNORMAL LOW (ref >40)
LDL Cholesterol: 8 mg/dL (ref 0–99)
Total CHOL/HDL Ratio: 1.7 RATIO
Triglycerides: 54 mg/dL (ref <150)
VLDL: 11 mg/dL (ref 0–40)

## 2020-01-27 LAB — MAGNESIUM: Magnesium: 2.5 mg/dL — ABNORMAL HIGH (ref 1.7–2.4)

## 2020-01-27 MED ORDER — SODIUM CHLORIDE 0.9 % IV SOLN: INTRAVENOUS | Status: DC

## 2020-01-27 MED ORDER — SODIUM CHLORIDE 0.9 % IV BOLUS
500.0000 mL | Freq: Once | INTRAVENOUS | Status: AC
  Administered 2020-01-27: 500 mL via INTRAVENOUS

## 2020-01-27 NOTE — Progress Notes (Signed)
*  PRELIMINARY RESULTS* Echocardiogram 2D Echocardiogram has been performed.  Eric Kennedy 01/27/2020, 9:25 AM

## 2020-01-27 NOTE — Progress Notes (Signed)
Wife was called twice this morning to obtain informed consent for pt's procedure today, however no one picked up. There is no written consent in the pt's chart, notified GI/Endoscopy.

## 2020-01-27 NOTE — Progress Notes (Signed)
Patient transported to CT via Trilogy transport vent with RN. Patient tolerated transport well.

## 2020-01-27 NOTE — Progress Notes (Signed)
[facility] GI progress note  S: Patient Is sedated but arousable on ventilator support. Patient wife is at bedside, And is visibly worried about the patient. They reported bleeding overnight.  O: Vital signs reviewed, stable  HEENT: [place]/AT. PERRLA. Has leftwards gaze Neck: Supple w/o nodes Chest: Coarse rhochi, no wheezes.  CV: Tachy, no gallop. Irregular. Abdomen: Soft, BS+. No masses.  Ext: No edema. Neuro: Left sided weakness. Sensation appears intact.  Skin: No obvious rashes or exanthems.  Labs reviewed. Hgb stable.  Impression:  1. Posthemorrhagic anemia- Hgb 8.6 2. History of hemetemesis. Differential includes PUD, Mallory-Weiss tear, Erosive esophagitis, Diuelafoy lesion, gastric malignancy, etc. 3. S/P progressing MCA distribution stroke.  4. Respiratory failure secondary to COPD exacerbation, improving per Dr Aleskerov&#39; assessment (Intensivist).  Recommendations:   1. Continue NPO. continue IV acid suppression. 2. Continue Vent as indicated. 3. EGD was tentatively planned for today, though valid consent could not be obtained despite receiving verbal consent yesterday from the patient who is now intubated. I was able to speak to the patient&#39;s son, Eric Kennedy, who has agreed to proceed with upper endoscopy. Picture of Eric Kennedy voice understanding of the nature of the plan procedure, indications, wrist and possible complications including, but not limited to bleeding, infection, perforation, heart attack, and The unlikely occurrence of death. He wishes to proceed. 4. further recommendations for the May pending outcome of upper endoscopy and clinical course.  Kathline Magic, M.D. [facility] Gastroenterologist [facility] [phone]

## 2020-01-27 NOTE — Consult Note (Signed)
[facility] Consultation Note  Patient ID: Eric Kennedy, MRN: [id number], DOB/AGE: 1934/10/23 85 y.o. Admit date: 01/26/2020   Date of Consult: 01/27/2020 Primary Physician: [name], MD Primary Cardiologist: Nehemiah Massed  Chief Complaint:  Chief Complaint  Patient presents with  . Fall   Reason for Consult: Non-ST elevation myocardial infarction with known coronary artery disease status post coronary bypass graft  HPI: 85 y.o. male with known coronary artery disease status post coronary artery bypass graft heart block status post pacemaker placement chronic kidney disease stage III with a glomerular filtration rate of 32 hypertension hyperlipidemia diabetes peripheral vascular disease with a recent fall and patient was found in the garage by the wife.  History also shows that the patient has been weak and fatigued and unable to do the physical activity he wishes.  There is been no apparent overt chest discomfort with the symptoms.  Today the patient despite being intubated is alert in does not report any chest discomfort and/or anginal type symptoms at this time.  The patient was admitted to the hospital for acute anemia with a hemoglobin of 4.7 now slowly improving to 8.5.  Additionally the patient has had evaluation and it suggest that he has had a stroke in the right frontal and parietal lobe possibly secondary to his severe anemia and will need further medication management.  Additionally the patient has had elevated troponin of 37, 1839, 7702, 12 867 most consistent with acute non-ST elevation myocardial infarction.  Without any evidence of chest discomfort for significant symptoms suggesting primary acute coronary syndrome this is likely secondary to demand ischemia from extreme anemia.  The patient additionally has had a left pleural effusion and mild amount of edema which may be also secondary to his current condition rather than a primary cardiovascular issue.  His  pacemaker has been pacing correctly with normal sinus rhythm and bundle branch block suggesting that the patient has a demand pacer with sensing.  Currently it does appear that the patient will need further evaluation and treatment options for his severe anemia with starting with a possible endoscopy colonoscopy which is at lower risk.  Currently he does appear to be hemodynamically stable although respiratory is still concerning for which the patient is receiving ceftriaxone for the possibility of aspiration and/or pneumonia.  Past Medical History:  Diagnosis Date  . Diabetes mellitus without complication ([place])   . Hypertension       Surgical History:  Past Surgical History:  Procedure Laterality Date  . CARDIAC SURGERY     triple bypass     Home Meds: Prior to Admission medications   Medication Sig Start Date End Date Taking? Authorizing Provider  amLODipine (NORVASC) 10 MG tablet Take 10 mg by mouth daily.    Yes [provider]  aspirin EC 81 MG tablet Take 81 mg by mouth daily.    Yes [provider]  levothyroxine (SYNTHROID) 50 MCG tablet Take 50 mcg by mouth daily.    Yes [provider]  Multiple Vitamins-Minerals (PX COMPLETE SENIOR MULTIVITS) TABS Take 1 tablet by mouth daily.    Yes [provider]  omeprazole (PRILOSEC) 40 MG capsule Take 40 mg by mouth daily.    Yes [provider]  rosuvastatin (CRESTOR) 40 MG tablet Take 40 mg by mouth daily.    Yes [provider]  telmisartan (MICARDIS) 80 MG tablet Take 80 mg by mouth daily.    Yes [provider]    Inpatient Medications:  .  chlorhexidine gluconate (MEDLINE KIT)  15 mL Mouth Rinse BID  . Chlorhexidine Gluconate Cloth  6 each Topical Daily  . docusate  100 mg Per Tube BID  . mouth rinse  15 mL Mouth Rinse 10 times per day  . [START ON 01/30/2020] pantoprazole  40 mg Intravenous Q12H  . polyethylene glycol  17 g Per Tube Daily  . sodium chloride flush   10-40 mL Intracatheter Q12H   . sodium chloride 75 mL/hr at 01/27/20 0600  . cefTRIAXone (ROCEPHIN)  IV Stopped (01/26/20 2016)  . fentaNYL infusion INTRAVENOUS 25 mcg/hr (01/27/20 0600)  . pantoprozole (PROTONIX) infusion 8 mg/hr (01/27/20 0600)    Allergies: No Known Allergies  Social History   Socioeconomic History  . Marital status: Married    Spouse name: Not on file  . Number of children: Not on file  . Years of education: Not on file  . Highest education level: Not on file  Occupational History  . Not on file  Tobacco Use  . Smoking status: Former Research scientist (life sciences)  . Smokeless tobacco: Never Used  Substance and Sexual Activity  . Alcohol use: Never  . Drug use: Never  . Sexual activity: Not on file  Other Topics Concern  . Not on file  Social History Narrative  . Not on file   Social Determinants of Health   Financial Resource Strain:   . Difficulty of Paying Living Expenses: Not on file  Food Insecurity:   . Worried About Charity fundraiser in the Last Year: Not on file  . Ran Out of Food in the Last Year: Not on file  Transportation Needs:   . Lack of Transportation (Medical): Not on file  . Lack of Transportation (Non-Medical): Not on file  Physical Activity:   . Days of Exercise per Week: Not on file  . Minutes of Exercise per Session: Not on file  Stress:   . Feeling of Stress : Not on file  Social Connections:   . Frequency of Communication with Friends and Family: Not on file  . Frequency of Social Gatherings with Friends and Family: Not on file  . Attends Religious Services: Not on file  . Active Member of Clubs or Organizations: Not on file  . Attends Archivist Meetings: Not on file  . Marital Status: Not on file  Intimate Partner Violence:   . Fear of Current or Ex-Partner: Not on file  . Emotionally Abused: Not on file  . Physically Abused: Not on file  . Sexually Abused: Not on file     Family History  Problem Relation Age of Onset   . Diabetes Mother   . Cancer Brother      Review of Systems Difficult to assess full review of systems due to intubation Labs: No results for input(s): CKTOTAL, CKMB, TROPONINI in the last 72 hours. Lab Results  Component Value Date   WBC 9.6 01/27/2020   HGB 8.5 (L) 01/27/2020   HCT 27.6 (L) 01/27/2020   MCV 74.8 (L) 01/27/2020   PLT 153 01/27/2020    Recent Labs  Lab 01/26/20 1532 01/26/20 1532 01/27/20 0502  NA 140   < > 141  K 4.6   < > 4.1  CL 109   < > 109  CO2 11*   < > 21*  BUN 19   < > 25*  CREATININE 2.01*   < > 2.14*  CALCIUM 9.7   < > 9.1  PROT 6.2*  --   --  BILITOT 1.9*  --   --   ALKPHOS 81  --   --   ALT 19  --   --   AST 57*  --   --   GLUCOSE 136*   < > 93   < > = values in this interval not displayed.   No results found for: CHOL, HDL, LDLCALC, TRIG No results found for: DDIMER  Radiology/Studies:  DG Chest 1 View  Result Date: 01/26/2020 CLINICAL DATA:  85 year old male found down. EXAM: CHEST  1 VIEW COMPARISON:  None. FINDINGS: Portable AP supine view at 1644 hours. Prior sternotomy, cardiac valve replacement. Left chest dual lead cardiac pacemaker. Cardiac size and mediastinal contours are within normal limits. Mildly low lung volumes. Asymmetric left lateral and costophrenic angle pleural density. Mild associated patchy peripheral opacity in the left lung. Mild streaky opacity in the left mid lung more resembling atelectasis or scarring. Allowing for portable technique the right lung is clear. No pneumothorax is evident. No rib fracture is identified. Grossly intact other visible osseous structures. Negative visible bowel gas pattern. IMPRESSION: 1. Suspicion of a small left pleural effusion, and patchy nonspecific opacity in the left mid lung. No adjacent rib fracture is identified radiographically. 2. No other acute cardiopulmonary abnormality. Electronically Signed   By: Genevie Ann M.D.   On: 01/26/2020 17:18   DG Pelvis 1-2 Views  Result  Date: 01/26/2020 CLINICAL DATA:  85 year old male found down. EXAM: PELVIS - 1-2 VIEW COMPARISON:  None. FINDINGS: Portable AP supine view at 1643 hours. Femoral heads are normally located. Hip joint spaces are normal for age. The pelvis appears intact. Grossly intact proximal femurs. Calcified femoral artery atherosclerosis. Oval circumscribed probable calcified phlebolith in the right central pelvis. Negative visible bowel gas pattern. IMPRESSION: No acute fracture or dislocation identified about the pelvis. Electronically Signed   By: Genevie Ann M.D.   On: 01/26/2020 17:19   DG Abdomen 1 View  Result Date: 01/26/2020 CLINICAL DATA:  Evaluate OG tube EXAM: ABDOMEN - 1 VIEW COMPARISON:  None. FINDINGS: The OG tube terminates in left upper quadrant, within the stomach. Is difficult to clearly see the side port but I believe the side port is likely in the stomach. IMPRESSION: The OG tube appears to be in good position. Electronically Signed   By: Dorise Bullion III M.D   On: 01/26/2020 19:34   CT HEAD WO CONTRAST  Result Date: 01/26/2020 CLINICAL DATA:  Found down EXAM: CT HEAD WITHOUT CONTRAST TECHNIQUE: Contiguous axial images were obtained from the base of the skull through the vertex without intravenous contrast. COMPARISON:  01/26/2020 FINDINGS: Brain: Again noted over the small areas of potential acute to subacute infarcts within the right frontal, right parietal lobes. No new suspicious areas for acute infarction. There is atrophy and chronic small vessel disease changes. No hydrocephalus. No hemorrhage. Vascular: No hyperdense vessel or unexpected calcification. Skull: No acute calvarial abnormality. Sinuses/Orbits: Mucosal thickening in the right sphenoid sinus. No acute findings. Other: None IMPRESSION: Stable small low-density areas within the right frontal and parietal lobes which could reflect small acute or subacute infarcts, unchanged since prior study. No hemorrhage. Electronically Signed    By: Rolm Baptise M.D.   On: 01/26/2020 20:49   DG Chest Portable 1 View  Result Date: 01/26/2020 CLINICAL DATA:  Evaluate OG tube an ETT placement. EXAM: PORTABLE CHEST 1 VIEW COMPARISON:  January 26, 2020 FINDINGS: An ET tube is been placed in the interval. The distal tip appears  to be 3 cm above the carina in good position. The distal tip of the NG tube is in the region of the stomach. The side port is not well seen on this study. The cardiomediastinal silhouette is stable. Probable pulmonary venous congestion. Opacity in the periphery of the left mid lower lung is stable. No other acute abnormalities. No pneumothorax. Small left effusion. IMPRESSION: 1. The distal tip of the NG tube is in the stomach. The side port is difficult to see on this study. The ETT is in good position. 2. Persistent opacity in the periphery of the left mid lower lung with a small left effusion. Electronically Signed   By: Dorise Bullion III M.D   On: 01/26/2020 19:33   CT HEAD CODE STROKE WO CONTRAST  Result Date: 01/26/2020 CLINICAL DATA:  Code stroke. Left-sided collect and upper extremity weakness. EXAM: CT HEAD WITHOUT CONTRAST TECHNIQUE: Contiguous axial images were obtained from the base of the skull through the vertex without intravenous contrast. COMPARISON:  01/03/2018 head and neck CTA FINDINGS: The study is mildly motion degraded despite repeat imaging. Brain: There are small cortical infarcts in the posterior right frontal lobe (precentral gyrus extending into the centrum semiovale) and right parietal lobe which are new from the CT and of indeterminate age though may be acute or subacute. There is also a new age indeterminate lacunar infarct in the right caudate head. No acute intracranial hemorrhage, mass, midline shift, or extra-axial fluid collection is identified. There is mild cerebral atrophy. Hypodensities in the cerebral white matter bilaterally are nonspecific but compatible with mild chronic small vessel  ischemic disease. Vascular: Calcified atherosclerosis at the skull base. No hyperdense vessel. Skull: No fracture or suspicious osseous lesion. Sinuses/Orbits: Chronic right sphenoid sinusitis. Small chronic right mastoid effusion. Bilateral cataract extraction. Other: None. ASPECTS [facility] Stroke Program Early CT Score) - Ganglionic level infarction (caudate, lentiform nuclei, internal capsule, insula, M1-M3 cortex): 6 - Supraganglionic infarction (M4-M6 cortex): 2 Total score (0-10 with 10 being normal): 8 IMPRESSION: 1. Small, potentially acute or subacute infarcts in the right frontal lobe, right parietal lobe, and right basal ganglia. No hemorrhage. 2. ASPECTS is 8. 3. Mild chronic small vessel ischemic disease. These results were called by telephone at the time of interpretation on 01/26/2020 at 3:56 pm to Dr. Vladimir Crofts, who verbally acknowledged these results. Electronically Signed   By: Logan Bores M.D.   On: 01/26/2020 15:57    EKG: Atrial sensing with paced rhythm  Weights: Filed Weights   01/26/20 1505 01/26/20 2100 01/27/20 0500  Weight: 74.8 kg 73.1 kg 72.9 kg     Physical Exam: Blood pressure (!) 130/54, pulse 76, temperature 99.3 F (37.4 C), resp. rate 11, height '5\' 8"'  (1.727 m), weight 72.9 kg, SpO2 100 %. Body mass index is 24.44 kg/m. General: Well developed, well nourished, in no acute distress. Head eyes ears nose throat: Normocephalic, atraumatic, sclera non-icteric, no xanthomas, nares are without discharge. No apparent thyromegaly and/or mass  Lungs: Intubated no wheezes, no rales, no rhonchi.  Heart: RRR with normal S1 S2. no murmur gallop, no rub, PMI is normal size and placement, carotid upstroke normal without bruit, jugular venous pressure is normal Abdomen: Soft, non-tender, non-distended with normoactive bowel sounds. No hepatomegaly. No rebound/guarding. No obvious abdominal masses. Abdominal aorta is normal size without bruit Extremities: No edema. no  cyanosis, no clubbing, no ulcers  Peripheral : 2+ bilateral upper extremity pulses, 2+ bilateral femoral pulses, 2+ bilateral dorsal pedal pulse Neuro: Alert  and not oriented. No facial asymmetry. No focal deficit. Moves all extremities spontaneously. Musculoskeletal: Normal muscle tone without kyphosis Psych:  Responds to questions      Assessment: 85 year old male with diabetes hypertension hyperlipidemia chronic kidney disease stage III coronary artery disease status post coronary artery bypass graft peripheral vascular disease with a fall likely secondary to right frontal and parietal stroke left pleural effusion consistent with possible mild cardiovascular cause with elevated troponin consistent with non-ST elevation myocardial infarction but no apparent acute coronary syndrome based on presentation all likely secondary to extreme anemia currently improved status post blood products and hemodynamically stable  Plan: 1.  Continue supportive care significant anemia which has likely caused majority of recurrent issues and symptoms with a goal hemoglobin above 8 2.  Patient is at overall lower risk for cardiovascular complication with endoscopy colonoscopy for further evaluation of causes of significant anemia if needed.  If patient continues to be hemodynamically stable with no evidence of significant new EKG changes or symptoms of angina would proceed to endoscopy colonoscopy for further evaluation of primary causes of above 3.  No additional medication management of antihypertensives from outpatient at this time a until further stabilization as per above 4.  Would use Lasix as needed for the possibility of small left pleural effusion and/or improvements for hypoxia prior to extubation 5.  No use of heparin or other anticoagulation at this time due to severe anemia until further evaluation of primary cause of severe anemia.  Patient has not had any issues with atrial fibrillation and no current  evidence of anginal symptoms despite elevation of troponin consistent with non-ST elevation myocardial infarction 6.  Echocardiogram to assess for wall motion abnormalities and/or segmental abnormalities suggested by elevated troponin and myocardial infarction  Signed, Corey Skains M.D. [facility] 01/27/2020, 6:41 AM

## 2020-01-27 NOTE — Progress Notes (Signed)
CRITICAL CARE NOTE      CHIEF COMPLAINT:   Acute blood loss anemia and acute CVA   SUBJECTIVE FINDINGS & SIGNIFICANT EVENTS   This 85 year old male with a history of coronary artery disease, coronary artery bypass grafting, complete heart block, atrial fibrillation aortic stenosis carotid artery stenosis, history of hypothyroidism and diabetes type 2, adhesive capsulitis of the left shoulder, chronic microcytic anemia history of choroidal hemangioma, dyslipidemia, chronic B12 deficiency, came in after mechanical fall and was found in the garage by his wife.  She is unclear how long his been down.  Wife is not a good historian and is unable to recall details of events.  On admission to the ED patient did have a CT head which revealed acute infarcts in the right frontal and parietal and basal ganglia. Wife at bedside states patient is full code, we discussed intubation she clarified code status and stated patient was intubated in past and he wants it done again.  Patient was arousable and was able to clarify he wants everything done. I spoke to neurologist and Gastroenterologist and plan is for endoscopy and colonoscopy. MRI was unable to be done due to pacemaker.   01/28/20 - I had goals of care meeting with family today. Present is wife as well as Academic librarian and son of patient is present in conference via phone.  We discussed patients current acute problems including Acute CVA and UGIB,  They agree to GI workup but do not want CPR if he deteriorates.  Code status- DNR   PAST MEDICAL HISTORY   Past Medical History:  Diagnosis Date  . Diabetes mellitus without complication ([place])   . Hypertension      SURGICAL HISTORY   Past Surgical History:  Procedure Laterality Date  . CARDIAC SURGERY     triple bypass      FAMILY HISTORY   Family History  Problem Relation Age of Onset  . Diabetes Mother   . Cancer Brother      SOCIAL HISTORY   Social History   Tobacco Use  . Smoking status: Former Research scientist (life sciences)  . Smokeless tobacco: Never Used  Substance Use Topics  . Alcohol use: Never  . Drug use: Never     MEDICATIONS   Current Medication:  Current Facility-Administered Medications:  .  0.9 %  sodium chloride infusion, , Intravenous, Continuous, Darel Hong D, NP, Last Rate: 75 mL/hr at 01/27/20 1200, Rate Verify at 01/27/20 1200 .  cefTRIAXone (ROCEPHIN) 1 g in sodium chloride 0.9 % 100 mL IVPB, 1 g, Intravenous, Q24H, Ottie Glazier, MD, Stopped at 01/26/20 2016 .  chlorhexidine gluconate (MEDLINE KIT) (PERIDEX) 0.12 % solution 15 mL, 15 mL, Mouth Rinse, BID, Darel Hong D, NP, 15 mL at 01/27/20 0800 .  Chlorhexidine Gluconate Cloth 2 % PADS 6 each, 6 each, Topical, Daily, Darel Hong D, NP, 6 each at 01/27/20 1046 .  docusate (COLACE) 50 MG/5ML liquid 100 mg, 100 mg, Per Tube, BID, Frandy Basnett, MD .  fentaNYL 2543mg in NS 2569m(1046mml) infusion-PREMIX, 0-400 mcg/hr, Intravenous, Continuous, SmiVladimir CroftsD, Last Rate: 12.5 mL/hr at 01/27/20 1200, 125 mcg/hr at 01/27/20 1200 .  MEDLINE mouth rinse, 15 mL, Mouth Rinse, 10 times per day, KeeDarel Hong NP, 15 mL at 01/27/20 1400 .  midazolam (VERSED) injection 1 mg, 1 mg, Intravenous, Q15 min PRN, KeeDarel Hong NP .  midazolam (VERSED) injection 1 mg, 1 mg, Intravenous, Q2H PRN, KeeBradly BienenstockP .  pantoprazole (PR[place]  80 mg in sodium chloride 0.9 % 100 mL (0.8 mg/mL) infusion, 8 mg/hr, Intravenous, Continuous, Vladimir Crofts, MD, Last Rate: 10 mL/hr at 01/27/20 1213, 8 mg/hr at 01/27/20 1213 .  [START ON 01/30/2020] pantoprazole (PROTONIX) injection 40 mg, 40 mg, Intravenous, Q12H, Vladimir Crofts, MD .  polyethylene glycol (MIRALAX / GLYCOLAX) packet 17 g, 17 g, Per Tube, Daily PRN, Lanney Gins, Clydean Posas, MD .   polyethylene glycol (MIRALAX / GLYCOLAX) packet 17 g, 17 g, Per Tube, Daily, Deajah Erkkila, MD .  sodium chloride flush (NS) 0.9 % injection 10-40 mL, 10-40 mL, Intracatheter, Q12H, Lanney Gins, Charliee Krenz, MD, 10 mL at 01/27/20 1048 .  sodium chloride flush (NS) 0.9 % injection 10-40 mL, 10-40 mL, Intracatheter, PRN, Ottie Glazier, MD    ALLERGIES   Patient has no known allergies.    REVIEW OF SYSTEMS     Unable to obtain due to encephalopathy.   PHYSICAL EXAMINATION   Vitals:   01/27/20 1400 01/27/20 1500  BP: (!) 116/47 (!) 118/50  Pulse: (!) 59 60  Resp: 15 16  Temp: 98.2 F (36.8 C) 98.8 F (37.1 C)  SpO2: 100% 100%    GENERAL:age appropirate encephalopathic HEAD: Normocephalic, atraumatic.  EYES: Pupils equal, round, reactive to light.  No scleral icterus.  MOUTH: Moist mucosal membrane. Dry blood around mouth NECK: Supple. No thyromegaly. No nodules. No JVD.  PULMONARY: mildly rhonchorous. CARDIOVASCULAR: S1 and S2. Regular rate and rhythm. No murmurs, rubs, or gallops.  GASTROINTESTINAL: Soft, nontender, non-distended. No masses. Positive bowel sounds. No hepatosplenomegaly.  MUSCULOSKELETAL: No swelling, clubbing, or edema.  NEUROLOGIC: Mild distress due to acute illness SKIN:intact,warm,dry   PERTINENT DATA      Lines / Drains: PIV  Cultures / Sepsis markers: BLOOD X2  Antibiotics: Rocephin 1g    Protocols / Consultants: GI & Neuro   Infusions: . sodium chloride 75 mL/hr at 01/27/20 1200  . cefTRIAXone (ROCEPHIN)  IV Stopped (01/26/20 2016)  . fentaNYL infusion INTRAVENOUS 125 mcg/hr (01/27/20 1200)  . pantoprozole (PROTONIX) infusion 8 mg/hr (01/27/20 1213)   Scheduled Medications: . chlorhexidine gluconate (MEDLINE KIT)  15 mL Mouth Rinse BID  . Chlorhexidine Gluconate Cloth  6 each Topical Daily  . docusate  100 mg Per Tube BID  . mouth rinse  15 mL Mouth Rinse 10 times per day  . [START ON 01/30/2020] pantoprazole  40 mg Intravenous  Q12H  . polyethylene glycol  17 g Per Tube Daily  . sodium chloride flush  10-40 mL Intracatheter Q12H   PRN Medications: midazolam, midazolam, polyethylene glycol, sodium chloride flush Hemodynamic parameters:   Intake/Output: 11/19 0701 - 11/20 0700 In: 1098.3 [I.V.:338.3; Blood:650; IV Piggyback:110] Out: 300 [Urine:200; Emesis/NG output:100]  Ventilator  Settings: Vent Mode: PRVC FiO2 (%):  [30 %-40 %] 30 % Set Rate:  [16 bmp] 16 bmp Vt Set:  [500 mL] 500 mL PEEP:  [5 cmH20] 5 cmH20 Plateau Pressure:  [19 cmH20] 19 cmH20   Other Labs:    LAB RESULTS:  Basic Metabolic Panel: Recent Labs  Lab 01/26/20 1532 01/27/20 0502  NA 140 141  K 4.6 4.1  CL 109 109  CO2 11* 21*  GLUCOSE 136* 93  BUN 19 25*  CREATININE 2.01* 2.14*  CALCIUM 9.7 9.1  MG 2.2 2.5*  PHOS  --  4.9*   Liver Function Tests: Recent Labs  Lab 01/26/20 1532  AST 57*  ALT 19  ALKPHOS 81  BILITOT 1.9*  PROT 6.2*  ALBUMIN 3.4*   No results for  input(s): LIPASE, AMYLASE in the last 168 hours. No results for input(s): AMMONIA in the last 168 hours. CBC: Recent Labs  Lab 01/26/20 1532 01/26/20 2030 01/27/20 0240 01/27/20 0502 01/27/20 0836  WBC 10.9*  --   --  9.6  --   NEUTROABS 9.3*  --   --   --   --   HGB 4.7* 7.3* 8.2* 8.5* 8.6*  HCT 20.0* 25.1* 27.2* 27.6* 28.3*  MCV 74.3*  --   --  74.8*  --   PLT 286  --   --  153  --    Cardiac Enzymes: No results for input(s): CKTOTAL, CKMB, CKMBINDEX, TROPONINI in the last 168 hours. BNP: Invalid input(s): POCBNP CBG: Recent Labs  Lab 01/26/20 2053  GLUCAP 118*     IMAGING RESULTS:  Imaging: DG Chest 1 View  Result Date: 01/26/2020 CLINICAL DATA:  85 year old male found down. EXAM: CHEST  1 VIEW COMPARISON:  None. FINDINGS: Portable AP supine view at 1644 hours. Prior sternotomy, cardiac valve replacement. Left chest dual lead cardiac pacemaker. Cardiac size and mediastinal contours are within normal limits. Mildly low lung  volumes. Asymmetric left lateral and costophrenic angle pleural density. Mild associated patchy peripheral opacity in the left lung. Mild streaky opacity in the left mid lung more resembling atelectasis or scarring. Allowing for portable technique the right lung is clear. No pneumothorax is evident. No rib fracture is identified. Grossly intact other visible osseous structures. Negative visible bowel gas pattern. IMPRESSION: 1. Suspicion of a small left pleural effusion, and patchy nonspecific opacity in the left mid lung. No adjacent rib fracture is identified radiographically. 2. No other acute cardiopulmonary abnormality. Electronically Signed   By: Genevie Ann M.D.   On: 01/26/2020 17:18   DG Pelvis 1-2 Views  Result Date: 01/26/2020 CLINICAL DATA:  85 year old male found down. EXAM: PELVIS - 1-2 VIEW COMPARISON:  None. FINDINGS: Portable AP supine view at 1643 hours. Femoral heads are normally located. Hip joint spaces are normal for age. The pelvis appears intact. Grossly intact proximal femurs. Calcified femoral artery atherosclerosis. Oval circumscribed probable calcified phlebolith in the right central pelvis. Negative visible bowel gas pattern. IMPRESSION: No acute fracture or dislocation identified about the pelvis. Electronically Signed   By: Genevie Ann M.D.   On: 01/26/2020 17:19   DG Abdomen 1 View  Result Date: 01/26/2020 CLINICAL DATA:  Evaluate OG tube EXAM: ABDOMEN - 1 VIEW COMPARISON:  None. FINDINGS: The OG tube terminates in left upper quadrant, within the stomach. Is difficult to clearly see the side port but I believe the side port is likely in the stomach. IMPRESSION: The OG tube appears to be in good position. Electronically Signed   By: Dorise Bullion III M.D   On: 01/26/2020 19:34   CT HEAD WO CONTRAST  Result Date: 01/27/2020 CLINICAL DATA:  85 year old male found down. Left side weakness with evidence of acute to subacute infarcts in the right MCA territory on presentation head  CT 1540 hours 01/26/2020. EXAM: CT HEAD WITHOUT CONTRAST TECHNIQUE: Contiguous axial images were obtained from the base of the skull through the vertex without intravenous contrast. COMPARISON:  Head CTs at 15:40 and 2040 hours yesterday. FINDINGS: Brain: Multifocal evolving cytotoxic edema in the posterior right hemisphere and along the right MCA/PCA watershed area. Areas involved include the superior perirolandic cortex (series 2 images 25 through 27), right parietal lobe, lateral right occipital lobe. No associated acute hemorrhage or intracranial mass effect. Stable gray-white matter differentiation elsewhere  since early yesterday. No ventriculomegaly. Vascular: Calcified atherosclerosis at the skull base. No suspicious intracranial vascular hyperdensity. Skull: No acute osseous abnormality identified. Sinuses/Orbits: Stable. Chronic right sphenoid sinusitis. Trace mastoid fluid. Other: No acute orbit or scalp soft tissue finding. IMPRESSION: 1. Evolving cortical infarcts in the posterior Right MCA and also the Right MCA/PCA watershed area. No associated hemorrhage or mass effect. 2. Stable CT appearance of the brain elsewhere. Electronically Signed   By: Genevie Ann M.D.   On: 01/27/2020 12:08   CT HEAD WO CONTRAST  Result Date: 01/26/2020 CLINICAL DATA:  Found down EXAM: CT HEAD WITHOUT CONTRAST TECHNIQUE: Contiguous axial images were obtained from the base of the skull through the vertex without intravenous contrast. COMPARISON:  01/26/2020 FINDINGS: Brain: Again noted over the small areas of potential acute to subacute infarcts within the right frontal, right parietal lobes. No new suspicious areas for acute infarction. There is atrophy and chronic small vessel disease changes. No hydrocephalus. No hemorrhage. Vascular: No hyperdense vessel or unexpected calcification. Skull: No acute calvarial abnormality. Sinuses/Orbits: Mucosal thickening in the right sphenoid sinus. No acute findings. Other: None  IMPRESSION: Stable small low-density areas within the right frontal and parietal lobes which could reflect small acute or subacute infarcts, unchanged since prior study. No hemorrhage. Electronically Signed   By: Rolm Baptise M.D.   On: 01/26/2020 20:49   US Carotid Bilateral  Result Date: 01/27/2020 CLINICAL DATA:  Stroke symptoms, hyperlipidemia and diabetes EXAM: BILATERAL CAROTID DUPLEX ULTRASOUND TECHNIQUE: Pearline Cables scale imaging, color Doppler and duplex ultrasound were performed of bilateral carotid and vertebral arteries in the neck. COMPARISON:  None. FINDINGS: Criteria: Quantification of carotid stenosis is based on velocity parameters that correlate the residual internal carotid diameter with NASCET-based stenosis levels, using the diameter of the distal internal carotid lumen as the denominator for stenosis measurement. The following velocity measurements were obtained: RIGHT ICA: 207/24 cm/sec CCA: 729/02 cm/sec SYSTOLIC ICA/CCA RATIO:  2.0 ECA: 179 cm/sec LEFT ICA: 255/25 cm/sec CCA: 11/15 cm/sec SYSTOLIC ICA/CCA RATIO:  2.6 ECA: 280 cm/sec RIGHT CAROTID ARTERY: Intimal thickening and mixed echogenicity heterogeneous carotid bifurcation atherosclerosis. Mid ICA velocity elevation measures 207/24 centimeters/second with mild turbulent flow and spectral broadening. Right ICA stenosis estimated at 50-69% by ultrasound criteria. RIGHT VERTEBRAL ARTERY:  Normal antegrade flow LEFT CAROTID ARTERY: Extensive calcific atherosclerosis of the left carotid bifurcation. Left proximal ICA velocity elevation measures up to 255/25 centimeters/second with turbulent flow and spectral broadening. Left ICA stenosis meets criteria for a greater than 70% stenosis. LEFT VERTEBRAL ARTERY:  Not visualized, suspect occluded IMPRESSION: Right ICA stenosis estimated at 50-69% Left ICA stenosis estimated greater than 70% Patent antegrade flow in the right vertebral artery Nonvisualized left vertebral artery, suspect occluded  Electronically Signed   By: Jerilynn Mages.  Shick M.D.   On: 01/27/2020 09:18   DG Chest Port 1 View  Result Date: 01/27/2020 CLINICAL DATA:  Respiratory failure. EXAM: PORTABLE CHEST 1 VIEW COMPARISON:  01/26/2020 and prior radiographs FINDINGS: An endotracheal tube with tip 3 cm above the carina and OG tube entering the stomach again noted. Pulmonary vascular congestion is again noted. Bilateral LOWER lung opacities have slightly increased from the prior study. No pneumothorax or large pleural effusion noted. Cardiac surgical changes and LEFT-sided pacemaker again noted. IMPRESSION: Slightly increased bilateral LOWER lung opacities/atelectasis without other significant change. Electronically Signed   By: Margarette Canada M.D.   On: 01/27/2020 07:31   DG Chest Portable 1 View  Result Date: 01/26/2020 CLINICAL DATA:  Evaluate OG tube an ETT placement. EXAM: PORTABLE CHEST 1 VIEW COMPARISON:  January 26, 2020 FINDINGS: An ET tube is been placed in the interval. The distal tip appears to be 3 cm above the carina in good position. The distal tip of the NG tube is in the region of the stomach. The side port is not well seen on this study. The cardiomediastinal silhouette is stable. Probable pulmonary venous congestion. Opacity in the periphery of the left mid lower lung is stable. No other acute abnormalities. No pneumothorax. Small left effusion. IMPRESSION: 1. The distal tip of the NG tube is in the stomach. The side port is difficult to see on this study. The ETT is in good position. 2. Persistent opacity in the periphery of the left mid lower lung with a small left effusion. Electronically Signed   By: Dorise Bullion III M.D   On: 01/26/2020 19:33   CT HEAD CODE STROKE WO CONTRAST  Result Date: 01/26/2020 CLINICAL DATA:  Code stroke. Left-sided collect and upper extremity weakness. EXAM: CT HEAD WITHOUT CONTRAST TECHNIQUE: Contiguous axial images were obtained from the base of the skull through the vertex without  intravenous contrast. COMPARISON:  01/03/2018 head and neck CTA FINDINGS: The study is mildly motion degraded despite repeat imaging. Brain: There are small cortical infarcts in the posterior right frontal lobe (precentral gyrus extending into the centrum semiovale) and right parietal lobe which are new from the CT and of indeterminate age though may be acute or subacute. There is also a new age indeterminate lacunar infarct in the right caudate head. No acute intracranial hemorrhage, mass, midline shift, or extra-axial fluid collection is identified. There is mild cerebral atrophy. Hypodensities in the cerebral white matter bilaterally are nonspecific but compatible with mild chronic small vessel ischemic disease. Vascular: Calcified atherosclerosis at the skull base. No hyperdense vessel. Skull: No fracture or suspicious osseous lesion. Sinuses/Orbits: Chronic right sphenoid sinusitis. Small chronic right mastoid effusion. Bilateral cataract extraction. Other: None. ASPECTS [facility] Stroke Program Early CT Score) - Ganglionic level infarction (caudate, lentiform nuclei, internal capsule, insula, M1-M3 cortex): 6 - Supraganglionic infarction (M4-M6 cortex): 2 Total score (0-10 with 10 being normal): 8 IMPRESSION: 1. Small, potentially acute or subacute infarcts in the right frontal lobe, right parietal lobe, and right basal ganglia. No hemorrhage. 2. ASPECTS is 8. 3. Mild chronic small vessel ischemic disease. These results were called by telephone at the time of interpretation on 01/26/2020 at 3:56 pm to Dr. Vladimir Crofts, who verbally acknowledged these results. Electronically Signed   By: Logan Bores M.D.   On: 01/26/2020 15:57      ASSESSMENT AND PLAN    -Multidisciplinary rounds held today  Acute blood loss anemia -upper GI bleed  -GI consult - Dr Alice Reichert - appreciate input - plan for EGD/C-scope -protonix 40 bid -unclear alcohol hx or variceal hx - empiric rocephin  -h/h improved - 1 more unit  prbc ordered -need better access - have ordered Midline -    Acute hemorhagic shock - due to severe anemia secondary to UGIB -treat GI bleed  -minitor H/h q6h x 24h    Acute CVA  -  Neurology on case - appreciate input  -s/p CTH, MRI unable to obtain - ppm    - repeat CTH -lipitor 80 ICU monitoring -asa in 48h -BP monitoring -neuro checks   Renal Failure-Acute KDIGO 3  -follow chem 7 -follow UO -continue Foley Catheter-assess need daily   ID -continue IV abx as prescibed -  follow up cultures  GI/Nutrition GI PROPHYLAXIS as indicated DIET-->TF's as tolerated Constipation protocol as indicated  ENDO - ICU hypoglycemic\Hyperglycemia protocol -check FSBS per protocol   ELECTROLYTES -follow labs as needed -replace as needed -pharmacy consultation   DVT/GI PRX ordered -SCDs  TRANSFUSIONS AS NEEDED MONITOR FSBS ASSESS the need for LABS as needed   Critical care provider statement:    Critical care time (minutes):  109   Critical care time was exclusive of:  Separately billable procedures and treating other patients   Critical care was necessary to treat or prevent imminent or life-threatening deterioration of the following conditions:  acute CVA, hemorragic shock, Upper GI bleed, multiple comorbid conditions.   Critical care was time spent personally by me on the following activities:  Development of treatment plan with patient or surrogate, discussions with consultants, evaluation of patient's response to treatment, examination of patient, obtaining history from patient or surrogate, ordering and performing treatments and interventions, ordering and review of laboratory studies and re-evaluation of patient's condition.  I assumed direction of critical care for this patient from another provider in my specialty: no    This document was prepared using Dragon voice recognition software and may include unintentional dictation errors.    Ottie Glazier, M.D.   Division of [place]

## 2020-01-27 NOTE — Progress Notes (Signed)
   01/27/20 0230  Clinical Encounter Type  Visited With Patient and family together  Visit Type Initial;Spiritual support;Social support  Referral From Nurse  Consult/Referral To Chaplain  ?Responded to Pg from nurse for Prayer for the Pt wife. Ch talked to wife and also prayed with her. Ch will follow up later.

## 2020-01-27 NOTE — Progress Notes (Addendum)
Neurology Progress Note  Patient ID: Eric Kennedy is a 85 y.o. male with past medical history significant for complete heart block status post pacemaker, potential atrial fibrillation not on anticoagulation, coronary artery disease, hypertension, hyperlipidemia, chronic kidney disease, moderate symptomatic mitral valve stenosis, diabetes, hypertension, hyperlipidemia, hypothyroidism, peripheral vascular disease.  Initially consulted for: Left-sided weakness  Major interval events:  -Troponin peak in the low-12K range, evaluated by cardiology -Has received a total of 4 units of blood with hemoglobin now meeting goal greater than 8 -Creatinine essentially stable this morning at 2.14 -Blood pressures have been stable in the 110-120/50s range; heart rates in the 60s (demand pacing) -Per chart review, not felt to have any further episodes of Afib and off AC as felt to be exclusive in sinus rhythm  Subjective: - Comfortable, denies headache, not clearly oriented   Exam: Vitals:   01/27/20 0700 01/27/20 0800  BP: (!) 124/50 (!) 129/52  Pulse: 61 61  Resp: 17 16  Temp: 99.1 F (37.3 C) 98.8 F (37.1 C)  SpO2: 100% 100%   Gen: In bed, comfortable  Resp: non-labored breathing, comfortable no vent Cardiac: Perfusing extremities well, color much improved  Abd: soft, nt  Neuro: MS: Awakens and follows some simple commands (thumbs up, two fingers) CN: Persistent right gaze preference, left field cut. Does not allow examination of his pupils (closes eyes briskly) Motor: Freely moving RUE and RLE, through very tremulous with any movement. Continues to have increased tone of the LUE. 3x flexion of the LLE, no movement of the LUE to noxious stim Sensory: Reports he can feel stimulation on the left arm, unclear if intensity is equal  left to right DTR: Increased on the left compared to the right   Pertinent Labs: As in interval events above.   Lipid panel and A1c pending  11/20 Carotid US:   Right ICA stenosis estimated at 50-69% Left ICA stenosis estimated greater than 70% Concern for occluded left vertebral artery   Impression: This is an 85 y.o. male who continues to have symptoms concerning for a right MCA syndrome. Suspect sigificant hypoperfusion infarct secondary to anemia/hypotension given symptoms have not improved with stabilization of BP and blood counts; imaging today does not show occlusion of culprit carotid though atheroembolic or cardioembolic etiologies remain on the differential. HCT and possible EEG today to assess further.   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  Recommendations: # Acute ischemic strokes in the setting of GI bleed and known carotid stenosis - Stroke labs HgbA1c, fasting lipid panel - Repeat HCT, if infarct size not larger, will obtain EEG to rule out intermittent seizures contributing to exam out of proportion to scan - Continue neuro checks, q2hr  - Echocardiogram - Carotid dopplers - If no hemorrhage on repeat HCT, start ASA as soon as able from GI bleed  - Consider Plavix 300 mg load with 75 mg daily for 21 - 90 day course pending GI bleed stabilization - Risk factor modification - Telemetry monitoring; 30 day event monitor on discharge given prior Afib but felt to be exclusively in NSR (if Afib captured here no need for monitor) - Blood pressure goal -- normotension, please maintain MAP 65-100 (ideally closer to 75, but pressors only for MAP < 65) - Appreciate GI following to identify and treat source of bleeding - Appreciate management of other issues per CCM - PT consult, OT consult, Speech consult,  - Neurology to follow  [place]  [facility] [phone]  Addendenum: HCT personally reviewed -- the findings of evolving infarct showing a greater territory affected match the patients exam well. Suspect there may be some subtle thalamic / corona radiata involvement  as well, masked by chronic microvascular disease. No need for EEG at this time. Will continue to monitor. A total of 40 minutes was spent in critical care of this patient including discussion with CCM, examination of patient, review of results, formulation of this plan.

## 2020-01-28 ENCOUNTER — Encounter
Admission: EM | Disposition: A | Discharge status: Skilled Nursing Facility | Payer: Self-pay | Source: Home / Self Care | Attending: Internal Medicine

## 2020-01-28 DIAGNOSIS — I639 Cerebral infarction, unspecified: Secondary | ICD-10-CM | POA: Diagnosis not present

## 2020-01-28 HISTORY — PX: ESOPHAGOGASTRODUODENOSCOPY: SHX5428

## 2020-01-28 LAB — CBC WITH DIFFERENTIAL/PLATELET
Abs Immature Granulocytes: 0.03 10*3/uL (ref 0.00–0.07)
Basophils Absolute: 0 10*3/uL (ref 0.0–0.1)
Basophils Relative: 0 %
Eosinophils Absolute: 0 10*3/uL (ref 0.0–0.5)
Eosinophils Relative: 0 %
HCT: 29.4 % — ABNORMAL LOW (ref 39.0–52.0)
Hemoglobin: 8.6 g/dL — ABNORMAL LOW (ref 13.0–17.0)
Immature Granulocytes: 0 %
Lymphocytes Relative: 8 %
Lymphs Abs: 0.7 10*3/uL (ref 0.7–4.0)
MCH: 22.9 pg — ABNORMAL LOW (ref 26.0–34.0)
MCHC: 29.3 g/dL — ABNORMAL LOW (ref 30.0–36.0)
MCV: 78.2 fL — ABNORMAL LOW (ref 80.0–100.0)
Monocytes Absolute: 0.7 10*3/uL (ref 0.1–1.0)
Monocytes Relative: 8 %
Neutro Abs: 7.4 10*3/uL (ref 1.7–7.7)
Neutrophils Relative %: 84 %
Platelets: 173 10*3/uL (ref 150–400)
RBC: 3.76 MIL/uL — ABNORMAL LOW (ref 4.22–5.81)
RDW: 22.2 % — ABNORMAL HIGH (ref 11.5–15.5)
Smear Review: NORMAL
WBC: 8.8 10*3/uL (ref 4.0–10.5)
nRBC: 0 % (ref 0.0–0.2)

## 2020-01-28 LAB — TYPE AND SCREEN
ABO/RH(D): AB POS
Antibody Screen: NEGATIVE
Unit division: 0
Unit division: 0
Unit division: 0
Unit division: 0

## 2020-01-28 LAB — ECHOCARDIOGRAM COMPLETE
AR max vel: 0.52 cm2
AV Area VTI: 0.6 cm2
AV Area mean vel: 0.54 cm2
AV Mean grad: 26 mmHg
AV Peak grad: 44.3 mmHg
Ao pk vel: 3.33 m/s
Area-P 1/2: 2.37 cm2
Height: 68 in
S' Lateral: 2.53 cm
Weight: 2571.45 oz

## 2020-01-28 LAB — BASIC METABOLIC PANEL
Anion gap: 8 (ref 5–15)
BUN: 33 mg/dL — ABNORMAL HIGH (ref 8–23)
CO2: 20 mmol/L — ABNORMAL LOW (ref 22–32)
Calcium: 8.8 mg/dL — ABNORMAL LOW (ref 8.9–10.3)
Chloride: 114 mmol/L — ABNORMAL HIGH (ref 98–111)
Creatinine, Ser: 2.33 mg/dL — ABNORMAL HIGH (ref 0.61–1.24)
GFR, Estimated: 27 mL/min — ABNORMAL LOW (ref >60)
Glucose, Bld: 83 mg/dL (ref 70–99)
Potassium: 4.2 mmol/L (ref 3.5–5.1)
Sodium: 142 mmol/L (ref 135–145)

## 2020-01-28 LAB — BPAM RBC
Blood Product Expiration Date: 202112012359
Blood Product Expiration Date: 202112052359
Blood Product Expiration Date: 202112052359
Blood Product Expiration Date: 202112192359
ISSUE DATE / TIME: 202111191724
ISSUE DATE / TIME: 202111191724
ISSUE DATE / TIME: 202111191757
ISSUE DATE / TIME: 202111192225
Unit Type and Rh: 5100
Unit Type and Rh: 5100
Unit Type and Rh: 600
Unit Type and Rh: 6200

## 2020-01-28 LAB — HEMOGLOBIN A1C
Hgb A1c MFr Bld: 5.6 % (ref 4.8–5.6)
Mean Plasma Glucose: 114.02 mg/dL

## 2020-01-28 SURGERY — EGD (ESOPHAGOGASTRODUODENOSCOPY)
Anesthesia: Monitor Anesthesia Care

## 2020-01-28 MED ORDER — ASPIRIN 81 MG PO CHEW
81.0000 mg | CHEWABLE_TABLET | Freq: Every day | ORAL | Status: DC
  Administered 2020-01-31 – 2020-02-07 (×7): 81 mg via ORAL
  Filled 2020-01-28 (×8): qty 1

## 2020-01-28 MED ORDER — CLOPIDOGREL BISULFATE 75 MG PO TABS
75.0000 mg | ORAL_TABLET | Freq: Every day | ORAL | Status: DC
  Administered 2020-01-31: 75 mg via ORAL
  Filled 2020-01-28 (×2): qty 1

## 2020-01-28 NOTE — Progress Notes (Signed)
[place] visited w/wife briefly twice this AM while rounding on ICU; wife shared that pt.'s son is flying in today from [place]; she reports having gone to church this AM and that church community are all concerned for pt.'s wellbeing.    [place] received page this afternoon to support wife as charge RN observed her distraught.  [place] entered rm. and wife began weeping openly, sharing 'they're going to pull the plug on him' per discussion b/w son and medical team.  Assuming this was true, [place] attempted to help wife process this --she shared that she had no idea when pt. was intubated in ED that 'this was going to be the end.'  [facility] noticed that wife's account of how long pt. has been in hospital was notably inaccurate and that she believes her son to have been her for several days despite his arrival only this afternoon.   Son entered rm. shortly afterward and said plan is not to discontinue life support as 'this is survivable'.  Son requested pt.'s wife and CH come outside the rm. to discuss further; in ICU consult rm. son shared w/his mother that pt. 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   Wife concerned that pt. would not want to continue living in this state; son says he is confident of pt.'s wishes to not give up.  Wife voices concern that son will return home to [place], leaving her to care for pt. alone; son says the main concern at the moment is dealing w/current hospitalization.  Son says he is pt.'s MPOA per paperwork he brought to hospital; wife is technically primary MPOA, he says, but has suffered from [place] for the past 23yrs.  At end of conversation, wife expressed agreement with plan to continue course of care and extubate pt. whenever possible.   Per wife's request, [place] prayed for pt. and family in rm.; pt. able to open eyes a little and faintly nod to questions being  asked.    [facility] made copies of pt.'s AD for chart and scanning.  Wife Eric Kennedy [phone]) listed as primary MPOA, followed by son Eric Kennedy [phone]) as alt. MPOA and Eric Kennedy [phone]) as second alternate.  Pt. expresses 'Desire for a natural death' and does not want life support continued in case of apparently imminent death from incurable disease, apparently permanent unconsciousness, or severe loss of the ability to think.  Pt. instructs MPOA to follow these wishes and gives permission for medical staff to use these wishes as guidelines for care.  Copy placed in paper chart and scanned to [facility]. [place] anticipates additional emotional support will be helpful for wife.

## 2020-01-28 NOTE — Progress Notes (Signed)
CRITICAL CARE NOTE      CHIEF COMPLAINT:   Acute blood loss anemia and acute CVA   SUBJECTIVE FINDINGS & SIGNIFICANT EVENTS   This 85 year old male with a history of coronary artery disease, coronary artery bypass grafting, complete heart block, atrial fibrillation aortic stenosis carotid artery stenosis, history of hypothyroidism and diabetes type 2, adhesive capsulitis of the left shoulder, chronic microcytic anemia history of choroidal hemangioma, dyslipidemia, chronic B12 deficiency, came in after mechanical fall and was found in the garage by his wife.  She is unclear how long his been down.  Wife is not a good historian and is unable to recall details of events.  On admission to the ED patient did have a CT head which revealed acute infarcts in the right frontal and parietal and basal ganglia. Wife at bedside states patient is full code, we discussed intubation she clarified code status and stated patient was intubated in past and he wants it done again.  Patient was arousable and was able to clarify he wants everything done. I spoke to neurologist and Gastroenterologist and plan is for endoscopy and colonoscopy. MRI was unable to be done due to pacemaker.   01/28/20 - I had goals of care meeting with family today. Present is wife as well as Academic librarian and son of patient is present in conference via phone.  We discussed patients current acute problems including Acute CVA and UGIB,  They agree to GI workup but do not want CPR if he deteriorates.  Code status- DNR .  I met with son and wife at bedside today. Discussed care plan with his team.   PAST MEDICAL HISTORY   Past Medical History:  Diagnosis Date  . Diabetes mellitus without complication ([place])   . Hypertension      SURGICAL HISTORY   Past Surgical History:   Procedure Laterality Date  . CARDIAC SURGERY     triple bypass     FAMILY HISTORY   Family History  Problem Relation Age of Onset  . Diabetes Mother   . Cancer Brother      SOCIAL HISTORY   Social History   Tobacco Use  . Smoking status: Former Research scientist (life sciences)  . Smokeless tobacco: Never Used  Substance Use Topics  . Alcohol use: Never  . Drug use: Never     MEDICATIONS   Current Medication:  Current Facility-Administered Medications:  .  0.9 %  sodium chloride infusion, , Intravenous, Continuous, Darel Hong D, NP, Last Rate: 75 mL/hr at 01/28/20 0600, Rate Verify at 01/28/20 0600 .  cefTRIAXone (ROCEPHIN) 1 g in sodium chloride 0.9 % 100 mL IVPB, 1 g, Intravenous, Q24H, Ibraheem Voris, MD, Stopping Infusion hung by another clincian at 01/27/20 1825 .  chlorhexidine gluconate (MEDLINE KIT) (PERIDEX) 0.12 % solution 15 mL, 15 mL, Mouth Rinse, BID, Darel Hong D, NP, 15 mL at 01/28/20 0749 .  Chlorhexidine Gluconate Cloth 2 % PADS 6 each, 6 each, Topical, Daily, Darel Hong D, NP, 6 each at 01/27/20 1046 .  docusate (COLACE) 50 MG/5ML liquid 100 mg, 100 mg, Per Tube, BID, Latronda Spink, MD .  fentaNYL 2529mg in NS 255m(1019mml) infusion-PREMIX, 0-400 mcg/hr, Intravenous, Continuous, SmiVladimir CroftsD, Last Rate: 15 mL/hr at 01/28/20 0600, 150 mcg/hr at 01/28/20 0600 .  MEDLINE mouth rinse, 15 mL, Mouth Rinse, 10 times per day, KeeDarel Hong NP, 15 mL at 01/28/20 0600 .  midazolam (VERSED) injection 1 mg, 1 mg, Intravenous, Q15 min PRN, KeeDarel Hong  D, NP .  midazolam (VERSED) injection 1 mg, 1 mg, Intravenous, Q2H PRN, Darel Hong D, NP .  pantoprazole (PROTONIX) 80 mg in sodium chloride 0.9 % 100 mL (0.8 mg/mL) infusion, 8 mg/hr, Intravenous, Continuous, Vladimir Crofts, MD, Last Rate: 10 mL/hr at 01/28/20 0600, 8 mg/hr at 01/28/20 0600 .  [START ON 01/30/2020] pantoprazole (PROTONIX) injection 40 mg, 40 mg, Intravenous, Q12H, Vladimir Crofts, MD .   polyethylene glycol (MIRALAX / GLYCOLAX) packet 17 g, 17 g, Per Tube, Daily PRN, Lanney Gins, Werner Labella, MD .  polyethylene glycol (MIRALAX / GLYCOLAX) packet 17 g, 17 g, Per Tube, Daily, Drenda Sobecki, MD .  sodium chloride flush (NS) 0.9 % injection 10-40 mL, 10-40 mL, Intracatheter, Q12H, Ottie Glazier, MD, 3 mL at 01/28/20 0748 .  sodium chloride flush (NS) 0.9 % injection 10-40 mL, 10-40 mL, Intracatheter, PRN, Ottie Glazier, MD    ALLERGIES   Patient has no known allergies.    REVIEW OF SYSTEMS     Unable to obtain due to encephalopathy.   PHYSICAL EXAMINATION   Vitals:   01/28/20 0600 01/28/20 0700  BP: (!) 111/52 125/62  Pulse: 69 80  Resp: 17 15  Temp: 99.7 F (37.6 C) 99.7 F (37.6 C)  SpO2: 100% 100%    GENERAL:age appropirate encephalopathic HEAD: Normocephalic, atraumatic.  EYES: Pupils equal, round, reactive to light.  No scleral icterus.  MOUTH: Moist mucosal membrane. Dry blood around mouth NECK: Supple. No thyromegaly. No nodules. No JVD.  PULMONARY: mildly rhonchorous. CARDIOVASCULAR: S1 and S2. Regular rate and rhythm. No murmurs, rubs, or gallops.  GASTROINTESTINAL: Soft, nontender, non-distended. No masses. Positive bowel sounds. No hepatosplenomegaly.  MUSCULOSKELETAL: No swelling, clubbing, or edema.  NEUROLOGIC: Mild distress due to acute illness SKIN:intact,warm,dry   PERTINENT DATA      Lines / Drains: PIV  Cultures / Sepsis markers: BLOOD X2  Antibiotics: Rocephin 1g    Protocols / Consultants: GI & Neuro   Infusions: . sodium chloride 75 mL/hr at 01/28/20 0600  . cefTRIAXone (ROCEPHIN)  IV Stopped (01/27/20 1825)  . fentaNYL infusion INTRAVENOUS 150 mcg/hr (01/28/20 0600)  . pantoprozole (PROTONIX) infusion 8 mg/hr (01/28/20 0600)   Scheduled Medications: . chlorhexidine gluconate (MEDLINE KIT)  15 mL Mouth Rinse BID  . Chlorhexidine Gluconate Cloth  6 each Topical Daily  . docusate  100 mg Per Tube BID  . mouth  rinse  15 mL Mouth Rinse 10 times per day  . [START ON 01/30/2020] pantoprazole  40 mg Intravenous Q12H  . polyethylene glycol  17 g Per Tube Daily  . sodium chloride flush  10-40 mL Intracatheter Q12H   PRN Medications: midazolam, midazolam, polyethylene glycol, sodium chloride flush Hemodynamic parameters:   Intake/Output: 11/20 0701 - 11/21 0700 In: 2939.8 [I.V.:2332; IV Piggyback:607.9] Out: 610 [Urine:410; Emesis/NG output:200]  Ventilator  Settings: Vent Mode: PRVC FiO2 (%):  [28 %-30 %] 28 % Set Rate:  [16 bmp] 16 bmp Vt Set:  [500 mL] 500 mL PEEP:  [5 cmH20] 5 cmH20 Plateau Pressure:  [18 cmH20] 18 cmH20   Other Labs:    LAB RESULTS:  Basic Metabolic Panel: Recent Labs  Lab 01/26/20 1532 01/26/20 1532 01/27/20 0502 01/28/20 0552  NA 140  --  141 142  K 4.6   < > 4.1 4.2  CL 109  --  109 114*  CO2 11*  --  21* 20*  GLUCOSE 136*  --  93 83  BUN 19  --  25* 33*  CREATININE 2.01*  --  2.14* 2.33*  CALCIUM 9.7  --  9.1 8.8*  MG 2.2  --  2.5*  --   PHOS  --   --  4.9*  --    < > = values in this interval not displayed.   Liver Function Tests: Recent Labs  Lab 01/26/20 1532  AST 57*  ALT 19  ALKPHOS 81  BILITOT 1.9*  PROT 6.2*  ALBUMIN 3.4*   No results for input(s): LIPASE, AMYLASE in the last 168 hours. No results for input(s): AMMONIA in the last 168 hours. CBC: Recent Labs  Lab 01/26/20 1532 01/26/20 2030 01/27/20 0502 01/27/20 0836 01/27/20 1544 01/27/20 2038 01/28/20 0552  WBC 10.9*  --  9.6  --   --   --  8.8  NEUTROABS 9.3*  --   --   --   --   --  7.4  HGB 4.7*   < > 8.5* 8.6* 8.6* 8.6* 8.6*  HCT 20.0*   < > 27.6* 28.3* 28.5* 29.1* 29.4*  MCV 74.3*  --  74.8*  --   --   --  78.2*  PLT 286  --  153  --   --   --  173   < > = values in this interval not displayed.   Cardiac Enzymes: No results for input(s): CKTOTAL, CKMB, CKMBINDEX, TROPONINI in the last 168 hours. BNP: Invalid input(s): POCBNP CBG: Recent Labs  Lab  01/26/20 2053  GLUCAP 118*     IMAGING RESULTS:  Imaging: DG Chest 1 View  Result Date: 01/26/2020 CLINICAL DATA:  85 year old male found down. EXAM: CHEST  1 VIEW COMPARISON:  None. FINDINGS: Portable AP supine view at 1644 hours. Prior sternotomy, cardiac valve replacement. Left chest dual lead cardiac pacemaker. Cardiac size and mediastinal contours are within normal limits. Mildly low lung volumes. Asymmetric left lateral and costophrenic angle pleural density. Mild associated patchy peripheral opacity in the left lung. Mild streaky opacity in the left mid lung more resembling atelectasis or scarring. Allowing for portable technique the right lung is clear. No pneumothorax is evident. No rib fracture is identified. Grossly intact other visible osseous structures. Negative visible bowel gas pattern. IMPRESSION: 1. Suspicion of a small left pleural effusion, and patchy nonspecific opacity in the left mid lung. No adjacent rib fracture is identified radiographically. 2. No other acute cardiopulmonary abnormality. Electronically Signed   By: Genevie Ann M.D.   On: 01/26/2020 17:18   DG Pelvis 1-2 Views  Result Date: 01/26/2020 CLINICAL DATA:  85 year old male found down. EXAM: PELVIS - 1-2 VIEW COMPARISON:  None. FINDINGS: Portable AP supine view at 1643 hours. Femoral heads are normally located. Hip joint spaces are normal for age. The pelvis appears intact. Grossly intact proximal femurs. Calcified femoral artery atherosclerosis. Oval circumscribed probable calcified phlebolith in the right central pelvis. Negative visible bowel gas pattern. IMPRESSION: No acute fracture or dislocation identified about the pelvis. Electronically Signed   By: Genevie Ann M.D.   On: 01/26/2020 17:19   DG Abdomen 1 View  Result Date: 01/26/2020 CLINICAL DATA:  Evaluate OG tube EXAM: ABDOMEN - 1 VIEW COMPARISON:  None. FINDINGS: The OG tube terminates in left upper quadrant, within the stomach. Is difficult to clearly  see the side port but I believe the side port is likely in the stomach. IMPRESSION: The OG tube appears to be in good position. Electronically Signed   By: Dorise Bullion III M.D   On: 01/26/2020 19:34   CT HEAD WO  CONTRAST  Result Date: 01/27/2020 CLINICAL DATA:  85 year old male found down. Left side weakness with evidence of acute to subacute infarcts in the right MCA territory on presentation head CT 1540 hours 01/26/2020. EXAM: CT HEAD WITHOUT CONTRAST TECHNIQUE: Contiguous axial images were obtained from the base of the skull through the vertex without intravenous contrast. COMPARISON:  Head CTs at 15:40 and 2040 hours yesterday. FINDINGS: Brain: Multifocal evolving cytotoxic edema in the posterior right hemisphere and along the right MCA/PCA watershed area. Areas involved include the superior perirolandic cortex (series 2 images 25 through 27), right parietal lobe, lateral right occipital lobe. No associated acute hemorrhage or intracranial mass effect. Stable gray-white matter differentiation elsewhere since early yesterday. No ventriculomegaly. Vascular: Calcified atherosclerosis at the skull base. No suspicious intracranial vascular hyperdensity. Skull: No acute osseous abnormality identified. Sinuses/Orbits: Stable. Chronic right sphenoid sinusitis. Trace mastoid fluid. Other: No acute orbit or scalp soft tissue finding. IMPRESSION: 1. Evolving cortical infarcts in the posterior Right MCA and also the Right MCA/PCA watershed area. No associated hemorrhage or mass effect. 2. Stable CT appearance of the brain elsewhere. Electronically Signed   By: Genevie Ann M.D.   On: 01/27/2020 12:08   CT HEAD WO CONTRAST  Result Date: 01/26/2020 CLINICAL DATA:  Found down EXAM: CT HEAD WITHOUT CONTRAST TECHNIQUE: Contiguous axial images were obtained from the base of the skull through the vertex without intravenous contrast. COMPARISON:  01/26/2020 FINDINGS: Brain: Again noted over the small areas of potential  acute to subacute infarcts within the right frontal, right parietal lobes. No new suspicious areas for acute infarction. There is atrophy and chronic small vessel disease changes. No hydrocephalus. No hemorrhage. Vascular: No hyperdense vessel or unexpected calcification. Skull: No acute calvarial abnormality. Sinuses/Orbits: Mucosal thickening in the right sphenoid sinus. No acute findings. Other: None IMPRESSION: Stable small low-density areas within the right frontal and parietal lobes which could reflect small acute or subacute infarcts, unchanged since prior study. No hemorrhage. Electronically Signed   By: Rolm Baptise M.D.   On: 01/26/2020 20:49   US Carotid Bilateral  Result Date: 01/27/2020 CLINICAL DATA:  Stroke symptoms, hyperlipidemia and diabetes EXAM: BILATERAL CAROTID DUPLEX ULTRASOUND TECHNIQUE: Pearline Cables scale imaging, color Doppler and duplex ultrasound were performed of bilateral carotid and vertebral arteries in the neck. COMPARISON:  None. FINDINGS: Criteria: Quantification of carotid stenosis is based on velocity parameters that correlate the residual internal carotid diameter with NASCET-based stenosis levels, using the diameter of the distal internal carotid lumen as the denominator for stenosis measurement. The following velocity measurements were obtained: RIGHT ICA: 207/24 cm/sec CCA: 683/41 cm/sec SYSTOLIC ICA/CCA RATIO:  2.0 ECA: 179 cm/sec LEFT ICA: 255/25 cm/sec CCA: 96/22 cm/sec SYSTOLIC ICA/CCA RATIO:  2.6 ECA: 280 cm/sec RIGHT CAROTID ARTERY: Intimal thickening and mixed echogenicity heterogeneous carotid bifurcation atherosclerosis. Mid ICA velocity elevation measures 207/24 centimeters/second with mild turbulent flow and spectral broadening. Right ICA stenosis estimated at 50-69% by ultrasound criteria. RIGHT VERTEBRAL ARTERY:  Normal antegrade flow LEFT CAROTID ARTERY: Extensive calcific atherosclerosis of the left carotid bifurcation. Left proximal ICA velocity elevation  measures up to 255/25 centimeters/second with turbulent flow and spectral broadening. Left ICA stenosis meets criteria for a greater than 70% stenosis. LEFT VERTEBRAL ARTERY:  Not visualized, suspect occluded IMPRESSION: Right ICA stenosis estimated at 50-69% Left ICA stenosis estimated greater than 70% Patent antegrade flow in the right vertebral artery Nonvisualized left vertebral artery, suspect occluded Electronically Signed   By: Jerilynn Mages.  Shick M.D.   On: 01/27/2020  09:18   DG Chest Port 1 View  Result Date: 01/27/2020 CLINICAL DATA:  Respiratory failure. EXAM: PORTABLE CHEST 1 VIEW COMPARISON:  01/26/2020 and prior radiographs FINDINGS: An endotracheal tube with tip 3 cm above the carina and OG tube entering the stomach again noted. Pulmonary vascular congestion is again noted. Bilateral LOWER lung opacities have slightly increased from the prior study. No pneumothorax or large pleural effusion noted. Cardiac surgical changes and LEFT-sided pacemaker again noted. IMPRESSION: Slightly increased bilateral LOWER lung opacities/atelectasis without other significant change. Electronically Signed   By: Margarette Canada M.D.   On: 01/27/2020 07:31   DG Chest Portable 1 View  Result Date: 01/26/2020 CLINICAL DATA:  Evaluate OG tube an ETT placement. EXAM: PORTABLE CHEST 1 VIEW COMPARISON:  January 26, 2020 FINDINGS: An ET tube is been placed in the interval. The distal tip appears to be 3 cm above the carina in good position. The distal tip of the NG tube is in the region of the stomach. The side port is not well seen on this study. The cardiomediastinal silhouette is stable. Probable pulmonary venous congestion. Opacity in the periphery of the left mid lower lung is stable. No other acute abnormalities. No pneumothorax. Small left effusion. IMPRESSION: 1. The distal tip of the NG tube is in the stomach. The side port is difficult to see on this study. The ETT is in good position. 2. Persistent opacity in the  periphery of the left mid lower lung with a small left effusion. Electronically Signed   By: Dorise Bullion III M.D   On: 01/26/2020 19:33   ECHOCARDIOGRAM COMPLETE  Result Date: 01/28/2020    ECHOCARDIOGRAM REPORT   Patient Name:   Jamarrius Demarcus Date of Exam: 01/27/2020 Medical Rec #:  [id number]   Height:       68.0 in Accession #:    [id number]  Weight:       160.7 lb Date of Birth:  05/07/1934   BSA:          1.862 m Patient Age:    45 years    BP:           116/49 mmHg Patient Gender: M           HR:           60 bpm. Exam Location:  [facility] Procedure: 2D Echo, Cardiac Doppler and Color Doppler Indications:     Stroke 434.91  History:         Patient has no prior history of Echocardiogram examinations.                  Prior CABG; Risk Factors:Hypertension and Diabetes.  Sonographer:     Sherrie Sport RDCS (AE) Referring Phys:  [id number] Lorenza Chick Diagnosing Phys: Serafina Royals MD  Sonographer Comments: Echo performed with patient supine and on artificial respirator and suboptimal apical window. IMPRESSIONS  1. Left ventricular ejection fraction, by estimation, is 50 to 55%. The left ventricle has low normal function. The left ventricle has no regional wall motion abnormalities. Left ventricular diastolic parameters were normal.  2. Right ventricular systolic function is normal. The right ventricular size is normal.  3. Left atrial size was moderately dilated.  4. 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  5. The aortic valve is calcified. Aortic valve regurgitation is trivial. Moderate aortic valve stenosis. FINDINGS  Left Ventricle: Left ventricular ejection fraction, by estimation, is 50 to 55%. The left  ventricle has low normal function. The left ventricle has no regional wall motion abnormalities. The left ventricular internal cavity size was normal in size. There is no left ventricular hypertrophy. Left ventricular diastolic parameters were normal. Right  Ventricle: The right ventricular size is normal. No increase in right ventricular wall thickness. Right ventricular systolic function is normal. Left Atrium: Left atrial size was moderately dilated. Right Atrium: Right atrial size was normal in size. Pericardium: There is no evidence of pericardial effusion. 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 Mild mitral valve stenosis. MV peak gradient, 19.2 mmHg. The mean mitral valve gradient is 8.0 mmHg. Tricuspid Valve: The tricuspid valve is normal in structure. Tricuspid valve regurgitation is mild. Aortic Valve: The aortic valve is calcified. Aortic valve regurgitation is trivial. Moderate aortic stenosis is present. Aortic valve mean gradient measures 26.0 mmHg. Aortic valve peak gradient measures 44.3 mmHg. Aortic valve area, by VTI measures 0.60  cm. Pulmonic Valve: The pulmonic valve was normal in structure. Pulmonic valve regurgitation is not visualized. Aorta: The aortic root and ascending aorta are structurally normal, with no evidence of dilitation. IAS/Shunts: No atrial level shunt detected by color flow Doppler.  LEFT VENTRICLE PLAX 2D LVIDd:         4.30 cm LVIDs:         2.53 cm LV PW:         0.98 cm LV IVS:        0.62 cm LVOT diam:     2.00 cm LV SV:         45 LV SV Index:   24 LVOT Area:     3.14 cm  RIGHT VENTRICLE RV Basal diam:  5.72 cm RV S prime:     9.68 cm/s TAPSE (M-mode): 3.6 cm LEFT ATRIUM              Index       RIGHT ATRIUM           Index LA diam:        5.10 cm  2.74 cm/m  RA Area:     24.20 cm LA Vol (A2C):   147.0 ml 78.93 ml/m RA Volume:   82.10 ml  44.08 ml/m LA Vol (A4C):   100.0 ml 53.70 ml/m LA Biplane Vol: 121.0 ml 64.97 ml/m  AORTIC VALVE AV Area (Vmax):    0.52 cm AV Area (Vmean):   0.54 cm AV Area (VTI):     0.60 cm AV Vmax:           332.75 cm/s AV Vmean:          234.750 cm/s AV VTI:            0.739 m AV Peak Grad:      44.3 mmHg AV Mean Grad:      26.0 mmHg LVOT Vmax:          55.40 cm/s LVOT Vmean:        40.500 cm/s LVOT VTI:          0.142 m LVOT/AV VTI ratio: 0.19  AORTA Ao Root diam: 2.00 cm MITRAL VALVE                TRICUSPID VALVE MV Area (PHT): 2.37 cm     TR Peak grad:   49.6 mmHg MV Peak grad:  19.2 mmHg    TR Vmax:        352.00 cm/s MV Mean grad:  8.0 mmHg  MV Vmax:       2.19 m/s     SHUNTS MV Vmean:      131.0 cm/s   Systemic VTI:  0.14 m MV Decel Time: 320 msec     Systemic Diam: 2.00 cm MV E velocity: 169.00 cm/s MV A velocity: 164.00 cm/s MV E/A ratio:  1.03 Serafina Royals MD Electronically signed by Serafina Royals MD Signature Date/Time: 01/28/2020/6:32:58 AM    Final    CT HEAD CODE STROKE WO CONTRAST  Result Date: 01/26/2020 CLINICAL DATA:  Code stroke. Left-sided collect and upper extremity weakness. EXAM: CT HEAD WITHOUT CONTRAST TECHNIQUE: Contiguous axial images were obtained from the base of the skull through the vertex without intravenous contrast. COMPARISON:  01/03/2018 head and neck CTA FINDINGS: The study is mildly motion degraded despite repeat imaging. Brain: There are small cortical infarcts in the posterior right frontal lobe (precentral gyrus extending into the centrum semiovale) and right parietal lobe which are new from the CT and of indeterminate age though may be acute or subacute. There is also a new age indeterminate lacunar infarct in the right caudate head. No acute intracranial hemorrhage, mass, midline shift, or extra-axial fluid collection is identified. There is mild cerebral atrophy. Hypodensities in the cerebral white matter bilaterally are nonspecific but compatible with mild chronic small vessel ischemic disease. Vascular: Calcified atherosclerosis at the skull base. No hyperdense vessel. Skull: No fracture or suspicious osseous lesion. Sinuses/Orbits: Chronic right sphenoid sinusitis. Small chronic right mastoid effusion. Bilateral cataract extraction. Other: None. ASPECTS [facility] Stroke Program Early CT Score) - Ganglionic level  infarction (caudate, lentiform nuclei, internal capsule, insula, M1-M3 cortex): 6 - Supraganglionic infarction (M4-M6 cortex): 2 Total score (0-10 with 10 being normal): 8 IMPRESSION: 1. Small, potentially acute or subacute infarcts in the right frontal lobe, right parietal lobe, and right basal ganglia. No hemorrhage. 2. ASPECTS is 8. 3. Mild chronic small vessel ischemic disease. These results were called by telephone at the time of interpretation on 01/26/2020 at 3:56 pm to Dr. Vladimir Crofts, who verbally acknowledged these results. Electronically Signed   By: Logan Bores M.D.   On: 01/26/2020 15:57      ASSESSMENT AND PLAN    -Multidisciplinary rounds held today  Acute blood loss anemia -upper GI bleed  -GI consult - Dr Alice Reichert - appreciate input - plan for EGD/C-scope -protonix 40 bid -unclear alcohol hx or variceal hx - empiric rocephin  -h/h improved - 1 more unit prbc ordered -need better access - have ordered Midline -    Acute hemorhagic shock - due to severe anemia secondary to UGIB -treat GI bleed  -minitor H/h q6h x 24h    Acute CVA  -  Neurology on case - appreciate input  -s/p CTH, MRI unable to obtain - ppm    - repeat CTH -lipitor 80 ICU monitoring -asa in 48h -BP monitoring -neuro checks   Renal Failure-Acute KDIGO 3  -follow chem 7 -follow UO -continue Foley Catheter-assess need daily   ID -continue IV abx as prescibed -follow up cultures  GI/Nutrition GI PROPHYLAXIS as indicated DIET-->TF's as tolerated Constipation protocol as indicated  ENDO - ICU hypoglycemic\Hyperglycemia protocol -check FSBS per protocol   ELECTROLYTES -follow labs as needed -replace as needed -pharmacy consultation   DVT/GI PRX ordered -SCDs  TRANSFUSIONS AS NEEDED MONITOR FSBS ASSESS the need for LABS as needed   Critical care provider statement:    Critical care time (minutes):  33   Critical care time was exclusive  of:  Separately billable procedures and  treating other patients   Critical care was necessary to treat or prevent imminent or life-threatening deterioration of the following conditions:  acute CVA, hemorragic shock, Upper GI bleed, multiple comorbid conditions.   Critical care was time spent personally by me on the following activities:  Development of treatment plan with patient or surrogate, discussions with consultants, evaluation of patient's response to treatment, examination of patient, obtaining history from patient or surrogate, ordering and performing treatments and interventions, ordering and review of laboratory studies and re-evaluation of patient's condition.  I assumed direction of critical care for this patient from another provider in my specialty: no    This document was prepared using Dragon voice recognition software and may include unintentional dictation errors.    Ottie Glazier, M.D.  Division of [place]

## 2020-01-28 NOTE — Progress Notes (Addendum)
Neurology Progress Note  Patient ID: Eric Kennedy is a 85 y.o. male with past medical history significant for complete heart block status post pacemaker, potential atrial fibrillation not on anticoagulation, coronary artery disease, hypertension, hyperlipidemia, chronic kidney disease, moderate symptomatic mitral valve stenosis, diabetes, hypertension, hyperlipidemia, hypothyroidism, peripheral vascular disease. Hemoglobin stable status post 4 units of blood, undergoing eval by GI   Initially consulted for: Left-sided weakness Length of stay: 2   Major interval events:  -Hemoglobin stable at ~8 without further transfusions -Blood pressures have been stable in the 110-120/50-60s range; heart rates in the 60s (demand pacing)  Subjective: - Comfortable, denies any pain, more confused appearing today  Exam: Vitals:   01/28/20 0600 01/28/20 0700  BP: (!) 111/52 125/62  Pulse: 69 80  Resp: 17 15  Temp: 99.7 F (37.6 C) 99.7 F (37.6 C)  SpO2: 100% 100%   Gen: In bed, comfortable  Resp: non-labored breathing, comfortable no vent Cardiac: Perfusing extremities well, color much improved  Abd: soft, nt  Neuro: MS: Awakens and follows some simple commands (thumbs up, but does not show 2 fingers, protrudes tongue to command) CN: Persistent right gaze preference, left field cut. PERRL 2-> 1 mm. Tongue midline  Motor: Less briskly moving RUE and RLE, through very tremulous with any movement. Continues to have increased tone of the LUE. 3x flexion of the LLE, no movement of the LUE to noxious stim Sensory: Winces to pain in the LUE briskly. LLE he nods that he can feel my maximal noxious stim but does not wince. Reacts readily to touch on the right arm and leg.  DTR: Increased on the left compared to the right   Pertinent Labs: - Creatinine uptrending 11/21 to 2.33 - Hgb remains stable at ~8   Lab Results  Component Value Date   HGBA1C 5.6 01/27/2020   Lab Results  Component Value Date    CHOL 47 01/27/2020   HDL 28 (L) 01/27/2020   LDLCALC 8 01/27/2020   TRIG 54 01/27/2020   CHOLHDL 1.7 01/27/2020    ECHO EF 50-55%, moderately dilated LA, mild MR and MS, mod AS  Impression: This is an 84 y.o. male who continues to have symptoms concerning for a right MCA syndrome. Most likely hypoperfusion infarct secondary to anemia/hypotension given symptoms have not improved with stabilization of BP and blood counts; imaging today does not show occlusion of culprit carotid though atheroembolic or cardioembolic etiologies remain possible. Will not be able to definitely determine etiology with data available at the the time of the event, unfortunately, though this does not substantially change management at this time. At this time, he is approaching peak swelling window (3-5 days post event, which ends 11/24 afternoon), and therefore continues to merit close monitoring.  Neurology is additionally asked to comment on prognosis.  Given his left MCA territory stroke and based on serial examinations, and I am concerned he may not regain much movement in the left arm and leg.  His personality will likely change, and he may have difficulty processing things such as intonation (prosody) --however his ability to understand language and speak to communicate his needs should remain intact.  He may have significant difficulties swallowing which can recover over the course of weeks to months and his speech may remain slurred.  Recommendations: # Acute ischemic strokes in the setting of GI bleed and known carotid stenosis > Repeat HCT with posterior parietal L MCA territory infarct, unable to obtain MRI here due to pacer >  Echocardiogram: EF 50-55%, moderately dilated LA, mild MR and MS, mod AS > Carotid dopplers completed: Right ICA stenosis estimated at 50-69%; Left ICA stenosis estimated greater than 70%, chronic L-vert occlusion redemonstrated > Risk factor modification, LDL (8) and A1c (5.6%) both  within goal.  - Continue neuro checks, q2hr  - Start ASA as soon cleared by GI - Consider Plavix 300 mg load with 75 mg daily for 21 - 90 day course pending GI bleed stabilization and if cleared by GI - Agree with Hgb goal > 8 per cardiology - Telemetry monitoring; 30 day event monitor on discharge given prior Afib but felt to be exclusively in NSR (if Afib captured here no need for monitor, LAE puts patient at high risk for pAfib) - Blood pressure goal -- normotension, please maintain MAP 65-100 (ideally closer to 75, but pressors only for MAP < 65) - Please avoid any hypotonic fluids (strong preference for NS over even LR, NS + D5 preferred over 1/2NS + D5 if needed - Appreciate GI following to identify and treat source of bleeding; EGD today - Appreciate management of other issues per CCM and cardiology - Appreciate PT consult, OT consult, Speech consult, when patient appropriate - Left a message with son today Adrik Khim [phone]) who is expected to come to hospital this afternoon; will attempt to meet with him face-to-face on arrival if emergent consult volume allows, otherwise will discuss by phone when able - Neurology to follow, please reactivate code stroke or page emergently with any significant change in neurological status  Lesleigh Noe MD-PhD [facility] [phone]  [facility] coverage for [facility] is from 8 AM to 4 AM in-house and 4 PM to 8 PM by telephone/video. 8 PM to 8 AM emergent questions or overnight urgent questions should be addressed to Teleneurology On-call or [facility] neurohospitalist; contact information can be found on AMION  90 minutes spent in care of this patient today. This includes discussions with cardiology, GI, critical care medicine, and family as well as examination of the patient and review of chart/imaging as documented above  Evening addendum:   On reexamination, patient was brisker in his responses.  He was able to  show me 2 fingers and reported that his son was not his son but an alien, which his son reports is their baseline dynamic.  He remains plegic on the left and tremulous on the right.  We had an extended discussion about the etiology of his stroke, post stroke recovery, deficits, further decline that can be expected should he suffer significant additional complications of his hospitalization, risk of hemorrhage and swelling, protracted road to recovery, likelihood of being bedbound.  All of the family's neurologic questions were answered.  His son shared that he had the patient's advanced directive paperwork with him which does instruct providers that heroic measures not be employed if he is suffering a terminal condition.  We discussed that while he would be expected to survive this injury should be able to communicate with his family should he not suffer anymore brain injuries, given his comorbidities he is likely to suffer a much more severe brain injury should he have cardiopulmonary arrest.  I defer to critical care medicine team/cardiology/GI discussions of whether his other organs would survive further significant injury.  - Scheduled 81 mg ASA and 75 mg Plavix starting 11/22 per GI clearance; will avoid Plavix load at this time given bleeding risk  Lesleigh Noe MD-PhD [facility] [phone]

## 2020-01-28 NOTE — Progress Notes (Signed)
[facility] Encounter Note  Patient: Eric Kennedy / Admit Date: 01/26/2020 / Date of Encounter: 01/28/2020, 8:07 AM   Subjective: 11/21 the patient is intubated and sedated at this time and cannot give any history.  Yesterday he did have alertness and did respond to questions.  Currently he has telemetry showing a paced rhythm at 63 bpm.  The patient has had an acute right frontal parietal stroke and a chest x-ray showing left pleural effusions but has not had any significant changes by chest x-ray yesterday.  Troponin levels have peaked at 12 867 consistent with a non-ST elevation myocardial infarction most consistent with his severe anemia chronic kidney disease.  Echocardiogram has shown normal LV systolic function with ejection fraction of 50% with moderate aortic valve stenosis mild to moderate mitral stenosis and mild mitral regurgitation with mild pulmonary hypertension.  This is not significantly changed from previous echocardiogram from 2019  Review of Systems: Cannot assess due to intubation  Objective: Telemetry: Paced rhythm at 63 bpm Physical Exam: Blood pressure 125/62, pulse 80, temperature 99.7 F (37.6 C), resp. rate 15, height _0  (1.727 m), weight 74.8 kg, SpO2 100 %. Body mass index is 25.07 kg/m. General: Well developed, well nourished, in no acute distress. Head: Normocephalic, atraumatic, sclera non-icteric, no xanthomas, nares are without discharge. Neck: No apparent masses Lungs: Intubated with few wheezes, no rhonchi, no rales , no crackles   Heart: Regular rate and rhythm, normal S1 S2, 2+ right upper sternal border and apical murmur, no rub, no gallop, PMI is normal size and placement, carotid upstroke normal without bruit, jugular venous pressure normal Abdomen: Soft, non-tender, non-distended with normoactive bowel sounds. No hepatosplenomegaly. Abdominal aorta is normal size without bruit Extremities: No edema, no clubbing, no cyanosis,  no ulcers,  Peripheral: 2+ radial, 2+ femoral, 2+ dorsal pedal pulses Neuro: Not alert and oriented.  Does not moves all extremities spontaneously. Psych: Does not responds to questions appropriately with a normal affect.   Intake/Output Summary (Last 24 hours) at 01/28/2020 0807 Last data filed at 01/28/2020 0600 Gross per 24 hour  Intake 2764.78 ml  Output 610 ml  Net 2154.78 ml    Inpatient Medications:   chlorhexidine gluconate (MEDLINE KIT)  15 mL Mouth Rinse BID   Chlorhexidine Gluconate Cloth  6 each Topical Daily   docusate  100 mg Per Tube BID   mouth rinse  15 mL Mouth Rinse 10 times per day   [START ON 01/30/2020] pantoprazole  40 mg Intravenous Q12H   polyethylene glycol  17 g Per Tube Daily   sodium chloride flush  10-40 mL Intracatheter Q12H   Infusions:   sodium chloride 75 mL/hr at 01/28/20 0600   cefTRIAXone (ROCEPHIN)  IV Stopped (01/27/20 1825)   fentaNYL infusion INTRAVENOUS 150 mcg/hr (01/28/20 0600)   pantoprozole (PROTONIX) infusion 8 mg/hr (01/28/20 0600)    Labs: Recent Labs    01/26/20 1532 01/26/20 1532 01/27/20 0502 01/28/20 0552  NA 140   < > 141 142  K 4.6   < > 4.1 4.2  CL 109   < > 109 114*  CO2 11*   < > 21* 20*  GLUCOSE 136*   < > 93 83  BUN 19   < > 25* 33*  CREATININE 2.01*   < > 2.14* 2.33*  CALCIUM 9.7   < > 9.1 8.8*  MG 2.2  --  2.5*  --   PHOS  --   --  4.9*  --    < > =  values in this interval not displayed.   Recent Labs    01/26/20 1532  AST 57*  ALT 19  ALKPHOS 81  BILITOT 1.9*  PROT 6.2*  ALBUMIN 3.4*   Recent Labs    01/26/20 1532 01/26/20 2030 01/27/20 0502 01/27/20 0836 01/27/20 2038 01/28/20 0552  WBC 10.9*   < > 9.6  --   --  8.8  NEUTROABS 9.3*  --   --   --   --  PENDING  HGB 4.7*   < > 8.5*   < > 8.6* 8.6*  HCT 20.0*   < > 27.6*   < > 29.1* 29.4*  MCV 74.3*   < > 74.8*  --   --  78.2*  PLT 286   < > 153  --   --  173   < > = values in this interval not displayed.   No results for  input(s): CKTOTAL, CKMB, TROPONINI in the last 72 hours. Invalid input(s): POCBNP Recent Labs    01/27/20 1544  HGBA1C 5.6     Weights: Filed Weights   01/26/20 2100 01/27/20 0500 01/28/20 0500  Weight: 73.1 kg 72.9 kg 74.8 kg     Radiology/Studies:  DG Chest 1 View  Result Date: 01/26/2020 CLINICAL DATA:  85 year old male found down. EXAM: CHEST  1 VIEW COMPARISON:  None. FINDINGS: Portable AP supine view at 1644 hours. Prior sternotomy, cardiac valve replacement. Left chest dual lead cardiac pacemaker. Cardiac size and mediastinal contours are within normal limits. Mildly low lung volumes. Asymmetric left lateral and costophrenic angle pleural density. Mild associated patchy peripheral opacity in the left lung. Mild streaky opacity in the left mid lung more resembling atelectasis or scarring. Allowing for portable technique the right lung is clear. No pneumothorax is evident. No rib fracture is identified. Grossly intact other visible osseous structures. Negative visible bowel gas pattern. IMPRESSION: 1. Suspicion of a small left pleural effusion, and patchy nonspecific opacity in the left mid lung. No adjacent rib fracture is identified radiographically. 2. No other acute cardiopulmonary abnormality. Electronically Signed   By: Genevie Ann M.D.   On: 01/26/2020 17:18   DG Pelvis 1-2 Views  Result Date: 01/26/2020 CLINICAL DATA:  85 year old male found down. EXAM: PELVIS - 1-2 VIEW COMPARISON:  None. FINDINGS: Portable AP supine view at 1643 hours. Femoral heads are normally located. Hip joint spaces are normal for age. The pelvis appears intact. Grossly intact proximal femurs. Calcified femoral artery atherosclerosis. Oval circumscribed probable calcified phlebolith in the right central pelvis. Negative visible bowel gas pattern. IMPRESSION: No acute fracture or dislocation identified about the pelvis. Electronically Signed   By: Genevie Ann M.D.   On: 01/26/2020 17:19   DG Abdomen 1  View  Result Date: 01/26/2020 CLINICAL DATA:  Evaluate OG tube EXAM: ABDOMEN - 1 VIEW COMPARISON:  None. FINDINGS: The OG tube terminates in left upper quadrant, within the stomach. Is difficult to clearly see the side port but I believe the side port is likely in the stomach. IMPRESSION: The OG tube appears to be in good position. Electronically Signed   By: Dorise Bullion III M.D   On: 01/26/2020 19:34   CT HEAD WO CONTRAST  Result Date: 01/27/2020 CLINICAL DATA:  85 year old male found down. Left side weakness with evidence of acute to subacute infarcts in the right MCA territory on presentation head CT 1540 hours 01/26/2020. EXAM: CT HEAD WITHOUT CONTRAST TECHNIQUE: Contiguous axial images were obtained from the base of the  skull through the vertex without intravenous contrast. COMPARISON:  Head CTs at 15:40 and 2040 hours yesterday. FINDINGS: Brain: Multifocal evolving cytotoxic edema in the posterior right hemisphere and along the right MCA/PCA watershed area. Areas involved include the superior perirolandic cortex (series 2 images 25 through 27), right parietal lobe, lateral right occipital lobe. No associated acute hemorrhage or intracranial mass effect. Stable gray-white matter differentiation elsewhere since early yesterday. No ventriculomegaly. Vascular: Calcified atherosclerosis at the skull base. No suspicious intracranial vascular hyperdensity. Skull: No acute osseous abnormality identified. Sinuses/Orbits: Stable. Chronic right sphenoid sinusitis. Trace mastoid fluid. Other: No acute orbit or scalp soft tissue finding. IMPRESSION: 1. Evolving cortical infarcts in the posterior Right MCA and also the Right MCA/PCA watershed area. No associated hemorrhage or mass effect. 2. Stable CT appearance of the brain elsewhere. Electronically Signed   By: Genevie Ann M.D.   On: 01/27/2020 12:08   CT HEAD WO CONTRAST  Result Date: 01/26/2020 CLINICAL DATA:  Found down EXAM: CT HEAD WITHOUT CONTRAST  TECHNIQUE: Contiguous axial images were obtained from the base of the skull through the vertex without intravenous contrast. COMPARISON:  01/26/2020 FINDINGS: Brain: Again noted over the small areas of potential acute to subacute infarcts within the right frontal, right parietal lobes. No new suspicious areas for acute infarction. There is atrophy and chronic small vessel disease changes. No hydrocephalus. No hemorrhage. Vascular: No hyperdense vessel or unexpected calcification. Skull: No acute calvarial abnormality. Sinuses/Orbits: Mucosal thickening in the right sphenoid sinus. No acute findings. Other: None IMPRESSION: Stable small low-density areas within the right frontal and parietal lobes which could reflect small acute or subacute infarcts, unchanged since prior study. No hemorrhage. Electronically Signed   By: Rolm Baptise M.D.   On: 01/26/2020 20:49   US Carotid Bilateral  Result Date: 01/27/2020 CLINICAL DATA:  Stroke symptoms, hyperlipidemia and diabetes EXAM: BILATERAL CAROTID DUPLEX ULTRASOUND TECHNIQUE: Pearline Cables scale imaging, color Doppler and duplex ultrasound were performed of bilateral carotid and vertebral arteries in the neck. COMPARISON:  None. FINDINGS: Criteria: Quantification of carotid stenosis is based on velocity parameters that correlate the residual internal carotid diameter with NASCET-based stenosis levels, using the diameter of the distal internal carotid lumen as the denominator for stenosis measurement. The following velocity measurements were obtained: RIGHT ICA: 207/24 cm/sec CCA: 462/86 cm/sec SYSTOLIC ICA/CCA RATIO:  2.0 ECA: 179 cm/sec LEFT ICA: 255/25 cm/sec CCA: 38/17 cm/sec SYSTOLIC ICA/CCA RATIO:  2.6 ECA: 280 cm/sec RIGHT CAROTID ARTERY: Intimal thickening and mixed echogenicity heterogeneous carotid bifurcation atherosclerosis. Mid ICA velocity elevation measures 207/24 centimeters/second with mild turbulent flow and spectral broadening. Right ICA stenosis estimated at  50-69% by ultrasound criteria. RIGHT VERTEBRAL ARTERY:  Normal antegrade flow LEFT CAROTID ARTERY: Extensive calcific atherosclerosis of the left carotid bifurcation. Left proximal ICA velocity elevation measures up to 255/25 centimeters/second with turbulent flow and spectral broadening. Left ICA stenosis meets criteria for a greater than 70% stenosis. LEFT VERTEBRAL ARTERY:  Not visualized, suspect occluded IMPRESSION: Right ICA stenosis estimated at 50-69% Left ICA stenosis estimated greater than 70% Patent antegrade flow in the right vertebral artery Nonvisualized left vertebral artery, suspect occluded Electronically Signed   By: Jerilynn Mages.  Shick M.D.   On: 01/27/2020 09:18   DG Chest Port 1 View  Result Date: 01/27/2020 CLINICAL DATA:  Respiratory failure. EXAM: PORTABLE CHEST 1 VIEW COMPARISON:  01/26/2020 and prior radiographs FINDINGS: An endotracheal tube with tip 3 cm above the carina and OG tube entering the stomach again noted. Pulmonary vascular congestion  is again noted. Bilateral LOWER lung opacities have slightly increased from the prior study. No pneumothorax or large pleural effusion noted. Cardiac surgical changes and LEFT-sided pacemaker again noted. IMPRESSION: Slightly increased bilateral LOWER lung opacities/atelectasis without other significant change. Electronically Signed   By: Margarette Canada M.D.   On: 01/27/2020 07:31   DG Chest Portable 1 View  Result Date: 01/26/2020 CLINICAL DATA:  Evaluate OG tube an ETT placement. EXAM: PORTABLE CHEST 1 VIEW COMPARISON:  January 26, 2020 FINDINGS: An ET tube is been placed in the interval. The distal tip appears to be 3 cm above the carina in good position. The distal tip of the NG tube is in the region of the stomach. The side port is not well seen on this study. The cardiomediastinal silhouette is stable. Probable pulmonary venous congestion. Opacity in the periphery of the left mid lower lung is stable. No other acute abnormalities. No  pneumothorax. Small left effusion. IMPRESSION: 1. The distal tip of the NG tube is in the stomach. The side port is difficult to see on this study. The ETT is in good position. 2. Persistent opacity in the periphery of the left mid lower lung with a small left effusion. Electronically Signed   By: Dorise Bullion III M.D   On: 01/26/2020 19:33   ECHOCARDIOGRAM COMPLETE  Result Date: 01/28/2020    ECHOCARDIOGRAM REPORT   Patient Name:   Eric Kennedy Date of Exam: 01/27/2020 Medical Rec #:  [id number]   Height:       68.0 in Accession #:    [id number]  Weight:       160.7 lb Date of Birth:  02-10-1935   BSA:          1.862 m Patient Age:    63 years    BP:           116/49 mmHg Patient Gender: M           HR:           60 bpm. Exam Location:  [facility] Procedure: 2D Echo, Cardiac Doppler and Color Doppler Indications:     Stroke 434.91  History:         Patient has no prior history of Echocardiogram examinations.                  Prior CABG; Risk Factors:Hypertension and Diabetes.  Sonographer:     Sherrie Sport RDCS (AE) Referring Phys:  [id number] Lorenza Chick Diagnosing Phys: Serafina Royals MD  Sonographer Comments: Echo performed with patient supine and on artificial respirator and suboptimal apical window. IMPRESSIONS  1. Left ventricular ejection fraction, by estimation, is 50 to 55%. The left ventricle has low normal function. The left ventricle has no regional wall motion abnormalities. Left ventricular diastolic parameters were normal.  2. Right ventricular systolic function is normal. The right ventricular size is normal.  3. Left atrial size was moderately dilated.  4. 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  5. The aortic valve is calcified. Aortic valve regurgitation is trivial. Moderate aortic valve stenosis. FINDINGS  Left Ventricle: Left ventricular ejection fraction, by estimation, is 50 to 55%. The left ventricle has low normal function. The left ventricle  has no regional wall motion abnormalities. The left ventricular internal cavity size was normal in size. There is no left ventricular hypertrophy. Left ventricular diastolic parameters were normal. Right Ventricle: The right ventricular size is normal. No increase in right ventricular  wall thickness. Right ventricular systolic function is normal. Left Atrium: Left atrial size was moderately dilated. Right Atrium: Right atrial size was normal in size. Pericardium: There is no evidence of pericardial effusion. 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 Mild mitral valve stenosis. MV peak gradient, 19.2 mmHg. The mean mitral valve gradient is 8.0 mmHg. Tricuspid Valve: The tricuspid valve is normal in structure. Tricuspid valve regurgitation is mild. Aortic Valve: The aortic valve is calcified. Aortic valve regurgitation is trivial. Moderate aortic stenosis is present. Aortic valve mean gradient measures 26.0 mmHg. Aortic valve peak gradient measures 44.3 mmHg. Aortic valve area, by VTI measures 0.60  cm. Pulmonic Valve: The pulmonic valve was normal in structure. Pulmonic valve regurgitation is not visualized. Aorta: The aortic root and ascending aorta are structurally normal, with no evidence of dilitation. IAS/Shunts: No atrial level shunt detected by color flow Doppler.  LEFT VENTRICLE PLAX 2D LVIDd:         4.30 cm LVIDs:         2.53 cm LV PW:         0.98 cm LV IVS:        0.62 cm LVOT diam:     2.00 cm LV SV:         45 LV SV Index:   24 LVOT Area:     3.14 cm  RIGHT VENTRICLE RV Basal diam:  5.72 cm RV S prime:     9.68 cm/s TAPSE (M-mode): 3.6 cm LEFT ATRIUM              Index       RIGHT ATRIUM           Index LA diam:        5.10 cm  2.74 cm/m  RA Area:     24.20 cm LA Vol (A2C):   147.0 ml 78.93 ml/m RA Volume:   82.10 ml  44.08 ml/m LA Vol (A4C):   100.0 ml 53.70 ml/m LA Biplane Vol: 121.0 ml 64.97 ml/m  AORTIC VALVE AV Area (Vmax):    0.52 cm AV Area (Vmean):    0.54 cm AV Area (VTI):     0.60 cm AV Vmax:           332.75 cm/s AV Vmean:          234.750 cm/s AV VTI:            0.739 m AV Peak Grad:      44.3 mmHg AV Mean Grad:      26.0 mmHg LVOT Vmax:         55.40 cm/s LVOT Vmean:        40.500 cm/s LVOT VTI:          0.142 m LVOT/AV VTI ratio: 0.19  AORTA Ao Root diam: 2.00 cm MITRAL VALVE                TRICUSPID VALVE MV Area (PHT): 2.37 cm     TR Peak grad:   49.6 mmHg MV Peak grad:  19.2 mmHg    TR Vmax:        352.00 cm/s MV Mean grad:  8.0 mmHg MV Vmax:       2.19 m/s     SHUNTS MV Vmean:      131.0 cm/s   Systemic VTI:  0.14 m MV Decel Time: 320 msec     Systemic Diam: 2.00 cm MV E velocity: 169.00 cm/s MV  A velocity: 164.00 cm/s MV E/A ratio:  1.03 Serafina Royals MD Electronically signed by Serafina Royals MD Signature Date/Time: 01/28/2020/6:32:58 AM    Final    CT HEAD CODE STROKE WO CONTRAST  Result Date: 01/26/2020 CLINICAL DATA:  Code stroke. Left-sided collect and upper extremity weakness. EXAM: CT HEAD WITHOUT CONTRAST TECHNIQUE: Contiguous axial images were obtained from the base of the skull through the vertex without intravenous contrast. COMPARISON:  01/03/2018 head and neck CTA FINDINGS: The study is mildly motion degraded despite repeat imaging. Brain: There are small cortical infarcts in the posterior right frontal lobe (precentral gyrus extending into the centrum semiovale) and right parietal lobe which are new from the CT and of indeterminate age though may be acute or subacute. There is also a new age indeterminate lacunar infarct in the right caudate head. No acute intracranial hemorrhage, mass, midline shift, or extra-axial fluid collection is identified. There is mild cerebral atrophy. Hypodensities in the cerebral white matter bilaterally are nonspecific but compatible with mild chronic small vessel ischemic disease. Vascular: Calcified atherosclerosis at the skull base. No hyperdense vessel. Skull: No fracture or suspicious osseous  lesion. Sinuses/Orbits: Chronic right sphenoid sinusitis. Small chronic right mastoid effusion. Bilateral cataract extraction. Other: None. ASPECTS [facility] Stroke Program Early CT Score) - Ganglionic level infarction (caudate, lentiform nuclei, internal capsule, insula, M1-M3 cortex): 6 - Supraganglionic infarction (M4-M6 cortex): 2 Total score (0-10 with 10 being normal): 8 IMPRESSION: 1. Small, potentially acute or subacute infarcts in the right frontal lobe, right parietal lobe, and right basal ganglia. No hemorrhage. 2. ASPECTS is 8. 3. Mild chronic small vessel ischemic disease. These results were called by telephone at the time of interpretation on 01/26/2020 at 3:56 pm to Dr. Vladimir Crofts, who verbally acknowledged these results. Electronically Signed   By: Logan Bores M.D.   On: 01/26/2020 15:57     Assessment and Recommendation  85 y.o. male with known coronary artery disease status post coronary artery bypass graft hypertension hyperlipidemia peripheral vascular disease coronary atherosclerosis status post pacemaker placement for heart block chronic kidney disease stage III aortic valve stenosis status post aortic valve replacement moderate mitral stenosis and mitral regurgitation with recent falling consistent with acute stroke and now having an acute non-ST elevation myocardial infarction multifactorial in nature including having severe anemia now improved with a hemoglobin improvements  Non-ST elevation myocardial infarction Patient has had a non-ST elevation myocardial infarction from multiple factors including severe anemia chronic kidney disease and coronary artery disease hypertension hyperlipidemia.  The patient will be treated with medication management including beta-blocker when able if patient has another blood pressure margin  Plan 1.  Continue supportive care for acute stroke and acute non-ST elevation myocardial infarction from multiple factors including acute anemia chronic  kidney disease 2.  No further cardiac diagnostics necessary at this time due to stability from the cardiovascular standpoint with a paced rhythm 3.  Addition of medication management for heart rate and blood pressure control if necessary and when able to have blood pressure will allow 4.  No additional anticoagulation and/or antiplatelet medication management for non-ST elevation myocardial infarction at this time due to severe anemia with no evidence of primary source of potential bleeding    Signed, Serafina Royals M.D. FACC

## 2020-01-28 NOTE — Op Note (Signed)
[facility] Patient Name: Derrall Hicks Procedure Date: 01/28/2020 9:15 AM MRN: [id number] Account #: [id number] Date of Birth: 05-Nov-1934 Admit Type: Inpatient Age: 85 Room: [facility] ENDO ROOM 4 Gender: Male Note Status: Finalized Procedure:             Upper GI endoscopy Indications:           Acute post hemorrhagic anemia, Hematemesis Providers:             [name]. Alice Reichert MD, MD Referring MD:          No Local Md, MD (Referring MD) Medicines:             Propofol per Anesthesia Complications:         No immediate complications. Estimated blood loss: None. Procedure:             Pre-Anesthesia Assessment:                        - The risks and benefits of the procedure and the                         sedation options and risks were discussed with the                         patient. All questions were answered and informed                         consent was obtained.                        - Patient identification and proposed procedure were                         verified prior to the procedure by the nurse. The                         procedure was verified in the procedure room.                        - ASA Grade Assessment: IV - A patient with severe                         systemic disease that is a constant threat to life.                        - After reviewing the risks and benefits, the patient                         was deemed in satisfactory condition to undergo the                         procedure.                        After obtaining informed consent, the endoscope was                         passed under direct vision. Throughout the procedure,  the patient's blood pressure, pulse, and oxygen                         saturations were monitored continuously. The Endoscope                         was introduced through the mouth, and advanced to the                         third part of duodenum. The upper  GI endoscopy was                         accomplished without difficulty. The patient tolerated                         the procedure well. Findings:      The Z-line was irregular and was found 35 to 37 cm from the incisors.      A 1 cm hiatal hernia was present. Estimated blood loss: none.      Nasogastric tube trauma characterized by erythema was evident in the       gastric body.      One non-bleeding superficial gastric ulcer with no stigmata of bleeding       was found in the gastric body. The lesion was 6 mm in largest dimension.      Patchy mild inflammation characterized by congestion (edema) and       erosions was found in the gastric antrum.      The examined duodenum was normal. Impression:            - Z-line irregular, 35 to 37 cm from the incisors.                        - 1 cm hiatal hernia.                        - Nasogastric tube trauma present in stomach.                        - Non-bleeding gastric ulcer with no stigmata of                         bleeding.                        - Gastritis.                        - Normal examined duodenum.                        - No specimens collected. Recommendation:        - Continue present medications.                        - I anticipate no further need for intervention.                        - GI sign off. Please call us back if we can help. Procedure Code(s):     --- Professional ---  25003, Esophagogastroduodenoscopy, flexible,                         transoral; diagnostic, including collection of                         specimen(s) by brushing or washing, when performed                         (separate procedure) Diagnosis Code(s):     --- Professional ---                        K92.0, Hematemesis                        D62, Acute posthemorrhagic anemia                        K29.70, Gastritis, unspecified, without bleeding                        K25.9, Gastric ulcer, unspecified as acute or  chronic,                         without hemorrhage or perforation                        K91.89, Other postprocedural complications and                         disorders of digestive system                        K44.9, Diaphragmatic hernia without obstruction or                         gangrene                        K22.8, Other specified diseases of esophagus CPT copyright 2019 [employer]. All rights reserved. The codes documented in this report are preliminary and upon coder review may  be revised to meet current compliance requirements. Efrain Sella MD, MD 01/28/2020 10:51:37 AM This report has been signed electronically. Number of Addenda: 0 Note Initiated On: 01/28/2020 9:15 AM Estimated Blood Loss:  Estimated blood loss: none. Estimated blood loss: none.      [facility]

## 2020-01-29 ENCOUNTER — Encounter: Payer: Self-pay | Admitting: Internal Medicine

## 2020-01-29 DIAGNOSIS — I639 Cerebral infarction, unspecified: Secondary | ICD-10-CM

## 2020-01-29 DIAGNOSIS — K922 Gastrointestinal hemorrhage, unspecified: Secondary | ICD-10-CM

## 2020-01-29 LAB — CBC
HCT: 30 % — ABNORMAL LOW (ref 39.0–52.0)
Hemoglobin: 8.5 g/dL — ABNORMAL LOW (ref 13.0–17.0)
MCH: 23 pg — ABNORMAL LOW (ref 26.0–34.0)
MCHC: 28.3 g/dL — ABNORMAL LOW (ref 30.0–36.0)
MCV: 81.3 fL (ref 80.0–100.0)
Platelets: 176 10*3/uL (ref 150–400)
RBC: 3.69 MIL/uL — ABNORMAL LOW (ref 4.22–5.81)
RDW: 23.2 % — ABNORMAL HIGH (ref 11.5–15.5)
WBC: 10.2 10*3/uL (ref 4.0–10.5)
nRBC: 0 % (ref 0.0–0.2)

## 2020-01-29 LAB — BASIC METABOLIC PANEL
Anion gap: 13 (ref 5–15)
BUN: 42 mg/dL — ABNORMAL HIGH (ref 8–23)
CO2: 16 mmol/L — ABNORMAL LOW (ref 22–32)
Calcium: 8.9 mg/dL (ref 8.9–10.3)
Chloride: 116 mmol/L — ABNORMAL HIGH (ref 98–111)
Creatinine, Ser: 2.64 mg/dL — ABNORMAL HIGH (ref 0.61–1.24)
GFR, Estimated: 23 mL/min — ABNORMAL LOW (ref >60)
Glucose, Bld: 97 mg/dL (ref 70–99)
Potassium: 4.1 mmol/L (ref 3.5–5.1)
Sodium: 145 mmol/L (ref 135–145)

## 2020-01-29 MED ORDER — FUROSEMIDE 10 MG/ML IJ SOLN
40.0000 mg | Freq: Once | INTRAMUSCULAR | Status: AC
  Administered 2020-01-29: 40 mg via INTRAVENOUS
  Filled 2020-01-29: qty 4

## 2020-01-29 MED ORDER — SODIUM CHLORIDE 0.9 % IV BOLUS
500.0000 mL | Freq: Once | INTRAVENOUS | Status: AC
  Administered 2020-01-29: 500 mL via INTRAVENOUS

## 2020-01-29 NOTE — Progress Notes (Signed)
[facility] Cardiology [facility] Encounter Note  Patient: Eric Kennedy / Admit Date: 01/26/2020 / Date of Encounter: 01/29/2020, 7:00 AM   Subjective: 11/21 the patient is intubated and sedated at this time and cannot give any history.  Yesterday he did have alertness and did respond to questions.  Currently he has telemetry showing a paced rhythm at 63 bpm.  The patient has had an acute right frontal parietal stroke and a chest x-ray showing left pleural effusions but has not had any significant changes by chest x-ray yesterday.  Troponin levels have peaked at 12 867 consistent with a non-ST elevation myocardial infarction most consistent with his severe anemia chronic kidney disease.  11/22 patient has been responsive this morning to commands and is able to listen and converse with head nod.  No evidence of chest pain throughout his entire hospital course although has had an elevated troponin of 12 751 consistent with non-ST elevation myocardial infarction multifactorial in nature as listed previously.  There has been no evidence of significant cardiovascular symptoms at this time requiring further intervention.  With patient having no evidence of bleeding complications from the gastric standpoint would recommend further treatment for non-ST elevation myocardial infarction.  Prognosis of myocardial infarction is good  Echocardiogram has shown normal LV systolic function with ejection fraction of 50% with moderate aortic valve stenosis mild to moderate mitral stenosis and mild mitral regurgitation with mild pulmonary hypertension.  This is not significantly changed from previous echocardiogram from 2019  Review of Systems: Cannot assess due to intubation  Objective: Telemetry: Paced rhythm at 63 bpm Physical Exam: Blood pressure (!) 118/50, pulse 78, temperature 98.8 F (37.1 C), resp. rate 11, height '5\' 8"'  (1.727 m), weight 80.9 kg, SpO2 99 %. Body mass index is 27.12 kg/m. General: Well  developed, well nourished, in no acute distress. Head: Normocephalic, atraumatic, sclera non-icteric, no xanthomas, nares are without discharge. Neck: No apparent masses Lungs: Intubated with few wheezes, no rhonchi, no rales , no crackles   Heart: Regular rate and rhythm, normal S1 S2, 2+ right upper sternal border and apical murmur, no rub, no gallop, PMI is normal size and placement, carotid upstroke normal without bruit, jugular venous pressure normal Abdomen: Soft, non-tender, non-distended with normoactive bowel sounds. No hepatosplenomegaly. Abdominal aorta is normal size without bruit Extremities: No edema, no clubbing, no cyanosis, no ulcers,  Peripheral: 2+ radial, 2+ femoral, 2+ dorsal pedal pulses Neuro: Not alert and oriented.  Does not moves all extremities spontaneously. Psych: Does not responds to questions appropriately with a normal affect.   Intake/Output Summary (Last 24 hours) at 01/29/2020 0700 Last data filed at 01/29/2020 0600 Gross per 24 hour  Intake 1722.99 ml  Output 685 ml  Net 1037.99 ml    Inpatient Medications:  . aspirin  81 mg Oral Daily  . chlorhexidine gluconate (MEDLINE KIT)  15 mL Mouth Rinse BID  . Chlorhexidine Gluconate Cloth  6 each Topical Daily  . clopidogrel  75 mg Oral Daily  . docusate  100 mg Per Tube BID  . mouth rinse  15 mL Mouth Rinse 10 times per day  . [START ON 01/30/2020] pantoprazole  40 mg Intravenous Q12H  . polyethylene glycol  17 g Per Tube Daily  . sodium chloride flush  10-40 mL Intracatheter Q12H   Infusions:  . sodium chloride 75 mL/hr at 01/29/20 0600  . cefTRIAXone (ROCEPHIN)  IV Stopped (01/28/20 1809)  . fentaNYL infusion INTRAVENOUS 150 mcg/hr (01/28/20 1007)  . pantoprozole (PROTONIX)  infusion 8 mg/hr (01/29/20 0600)    Labs: Recent Labs    01/26/20 1532 01/26/20 1532 01/27/20 0502 01/27/20 0502 01/28/20 0552 01/29/20 0246  NA 140   < > 141   < > 142 145  K 4.6   < > 4.1   < > 4.2 4.1  CL 109   < >  109   < > 114* 116*  CO2 11*   < > 21*   < > 20* 16*  GLUCOSE 136*   < > 93   < > 83 97  BUN 19   < > 25*   < > 33* 42*  CREATININE 2.01*   < > 2.14*   < > 2.33* 2.64*  CALCIUM 9.7   < > 9.1   < > 8.8* 8.9  MG 2.2  --  2.5*  --   --   --   PHOS  --   --  4.9*  --   --   --    < > = values in this interval not displayed.   Recent Labs    01/26/20 1532  AST 57*  ALT 19  ALKPHOS 81  BILITOT 1.9*  PROT 6.2*  ALBUMIN 3.4*   Recent Labs    01/26/20 1532 01/26/20 2030 01/28/20 0552 01/29/20 0246  WBC 10.9*   < > 8.8 10.2  NEUTROABS 9.3*  --  7.4  --   HGB 4.7*   < > 8.6* 8.5*  HCT 20.0*   < > 29.4* 30.0*  MCV 74.3*   < > 78.2* 81.3  PLT 286   < > 173 176   < > = values in this interval not displayed.   No results for input(s): CKTOTAL, CKMB, TROPONINI in the last 72 hours. Invalid input(s): POCBNP Recent Labs    01/27/20 1544  HGBA1C 5.6     Weights: Filed Weights   01/27/20 0500 01/28/20 0500 01/29/20 0500  Weight: 72.9 kg 74.8 kg 80.9 kg     Radiology/Studies:  DG Chest 1 View  Result Date: 01/26/2020 CLINICAL DATA:  85 year old male found down. EXAM: CHEST  1 VIEW COMPARISON:  None. FINDINGS: Portable AP supine view at 1644 hours. Prior sternotomy, cardiac valve replacement. Left chest dual lead cardiac pacemaker. Cardiac size and mediastinal contours are within normal limits. Mildly low lung volumes. Asymmetric left lateral and costophrenic angle pleural density. Mild associated patchy peripheral opacity in the left lung. Mild streaky opacity in the left mid lung more resembling atelectasis or scarring. Allowing for portable technique the right lung is clear. No pneumothorax is evident. No rib fracture is identified. Grossly intact other visible osseous structures. Negative visible bowel gas pattern. IMPRESSION: 1. Suspicion of a small left pleural effusion, and patchy nonspecific opacity in the left mid lung. No adjacent rib fracture is identified radiographically.  2. No other acute cardiopulmonary abnormality. Electronically Signed   By: Genevie Ann M.D.   On: 01/26/2020 17:18   DG Pelvis 1-2 Views  Result Date: 01/26/2020 CLINICAL DATA:  85 year old male found down. EXAM: PELVIS - 1-2 VIEW COMPARISON:  None. FINDINGS: Portable AP supine view at 1643 hours. Femoral heads are normally located. Hip joint spaces are normal for age. The pelvis appears intact. Grossly intact proximal femurs. Calcified femoral artery atherosclerosis. Oval circumscribed probable calcified phlebolith in the right central pelvis. Negative visible bowel gas pattern. IMPRESSION: No acute fracture or dislocation identified about the pelvis. Electronically Signed   By: Herminio Heads.D.  On: 01/26/2020 17:19   DG Abdomen 1 View  Result Date: 01/26/2020 CLINICAL DATA:  Evaluate OG tube EXAM: ABDOMEN - 1 VIEW COMPARISON:  None. FINDINGS: The OG tube terminates in left upper quadrant, within the stomach. Is difficult to clearly see the side port but I believe the side port is likely in the stomach. IMPRESSION: The OG tube appears to be in good position. Electronically Signed   By: Dorise Bullion III M.D   On: 01/26/2020 19:34   CT HEAD WO CONTRAST  Result Date: 01/27/2020 CLINICAL DATA:  85 year old male found down. Left side weakness with evidence of acute to subacute infarcts in the right MCA territory on presentation head CT 1540 hours 01/26/2020. EXAM: CT HEAD WITHOUT CONTRAST TECHNIQUE: Contiguous axial images were obtained from the base of the skull through the vertex without intravenous contrast. COMPARISON:  Head CTs at 15:40 and 2040 hours yesterday. FINDINGS: Brain: Multifocal evolving cytotoxic edema in the posterior right hemisphere and along the right MCA/PCA watershed area. Areas involved include the superior perirolandic cortex (series 2 images 25 through 27), right parietal lobe, lateral right occipital lobe. No associated acute hemorrhage or intracranial mass effect. Stable  gray-white matter differentiation elsewhere since early yesterday. No ventriculomegaly. Vascular: Calcified atherosclerosis at the skull base. No suspicious intracranial vascular hyperdensity. Skull: No acute osseous abnormality identified. Sinuses/Orbits: Stable. Chronic right sphenoid sinusitis. Trace mastoid fluid. Other: No acute orbit or scalp soft tissue finding. IMPRESSION: 1. Evolving cortical infarcts in the posterior Right MCA and also the Right MCA/PCA watershed area. No associated hemorrhage or mass effect. 2. Stable CT appearance of the brain elsewhere. Electronically Signed   By: Genevie Ann M.D.   On: 01/27/2020 12:08   CT HEAD WO CONTRAST  Result Date: 01/26/2020 CLINICAL DATA:  Found down EXAM: CT HEAD WITHOUT CONTRAST TECHNIQUE: Contiguous axial images were obtained from the base of the skull through the vertex without intravenous contrast. COMPARISON:  01/26/2020 FINDINGS: Brain: Again noted over the small areas of potential acute to subacute infarcts within the right frontal, right parietal lobes. No new suspicious areas for acute infarction. There is atrophy and chronic small vessel disease changes. No hydrocephalus. No hemorrhage. Vascular: No hyperdense vessel or unexpected calcification. Skull: No acute calvarial abnormality. Sinuses/Orbits: Mucosal thickening in the right sphenoid sinus. No acute findings. Other: None IMPRESSION: Stable small low-density areas within the right frontal and parietal lobes which could reflect small acute or subacute infarcts, unchanged since prior study. No hemorrhage. Electronically Signed   By: Rolm Baptise M.D.   On: 01/26/2020 20:49   US Carotid Bilateral  Result Date: 01/27/2020 CLINICAL DATA:  Stroke symptoms, hyperlipidemia and diabetes EXAM: BILATERAL CAROTID DUPLEX ULTRASOUND TECHNIQUE: Pearline Cables scale imaging, color Doppler and duplex ultrasound were performed of bilateral carotid and vertebral arteries in the neck. COMPARISON:  None. FINDINGS:  Criteria: Quantification of carotid stenosis is based on velocity parameters that correlate the residual internal carotid diameter with NASCET-based stenosis levels, using the diameter of the distal internal carotid lumen as the denominator for stenosis measurement. The following velocity measurements were obtained: RIGHT ICA: 207/24 cm/sec CCA: 588/32 cm/sec SYSTOLIC ICA/CCA RATIO:  2.0 ECA: 179 cm/sec LEFT ICA: 255/25 cm/sec CCA: 54/98 cm/sec SYSTOLIC ICA/CCA RATIO:  2.6 ECA: 280 cm/sec RIGHT CAROTID ARTERY: Intimal thickening and mixed echogenicity heterogeneous carotid bifurcation atherosclerosis. Mid ICA velocity elevation measures 207/24 centimeters/second with mild turbulent flow and spectral broadening. Right ICA stenosis estimated at 50-69% by ultrasound criteria. RIGHT VERTEBRAL ARTERY:  Normal antegrade  flow LEFT CAROTID ARTERY: Extensive calcific atherosclerosis of the left carotid bifurcation. Left proximal ICA velocity elevation measures up to 255/25 centimeters/second with turbulent flow and spectral broadening. Left ICA stenosis meets criteria for a greater than 70% stenosis. LEFT VERTEBRAL ARTERY:  Not visualized, suspect occluded IMPRESSION: Right ICA stenosis estimated at 50-69% Left ICA stenosis estimated greater than 70% Patent antegrade flow in the right vertebral artery Nonvisualized left vertebral artery, suspect occluded Electronically Signed   By: Jerilynn Mages.  Shick M.D.   On: 01/27/2020 09:18   DG Chest Port 1 View  Result Date: 01/27/2020 CLINICAL DATA:  Respiratory failure. EXAM: PORTABLE CHEST 1 VIEW COMPARISON:  01/26/2020 and prior radiographs FINDINGS: An endotracheal tube with tip 3 cm above the carina and OG tube entering the stomach again noted. Pulmonary vascular congestion is again noted. Bilateral LOWER lung opacities have slightly increased from the prior study. No pneumothorax or large pleural effusion noted. Cardiac surgical changes and LEFT-sided pacemaker again noted.  IMPRESSION: Slightly increased bilateral LOWER lung opacities/atelectasis without other significant change. Electronically Signed   By: Margarette Canada M.D.   On: 01/27/2020 07:31   DG Chest Portable 1 View  Result Date: 01/26/2020 CLINICAL DATA:  Evaluate OG tube an ETT placement. EXAM: PORTABLE CHEST 1 VIEW COMPARISON:  January 26, 2020 FINDINGS: An ET tube is been placed in the interval. The distal tip appears to be 3 cm above the carina in good position. The distal tip of the NG tube is in the region of the stomach. The side port is not well seen on this study. The cardiomediastinal silhouette is stable. Probable pulmonary venous congestion. Opacity in the periphery of the left mid lower lung is stable. No other acute abnormalities. No pneumothorax. Small left effusion. IMPRESSION: 1. The distal tip of the NG tube is in the stomach. The side port is difficult to see on this study. The ETT is in good position. 2. Persistent opacity in the periphery of the left mid lower lung with a small left effusion. Electronically Signed   By: Dorise Bullion III M.D   On: 01/26/2020 19:33   ECHOCARDIOGRAM COMPLETE  Result Date: 01/28/2020    ECHOCARDIOGRAM REPORT   Patient Name:   Joie Mumme Date of Exam: 01/27/2020 Medical Rec #:  [id number]   Height:       68.0 in Accession #:    [id number]  Weight:       160.7 lb Date of Birth:  11-14-1934   BSA:          1.862 m Patient Age:    68 years    BP:           116/49 mmHg Patient Gender: M           HR:           60 bpm. Exam Location:  [facility] Procedure: 2D Echo, Cardiac Doppler and Color Doppler Indications:     Stroke 434.91  History:         Patient has no prior history of Echocardiogram examinations.                  Prior CABG; Risk Factors:Hypertension and Diabetes.  Sonographer:     Sherrie Sport RDCS (AE) Referring Phys:  [id number] Lorenza Chick Diagnosing Phys: Serafina Royals MD  Sonographer Comments: Echo performed with patient supine and on artificial respirator  and suboptimal apical window. IMPRESSIONS  1. Left ventricular ejection fraction, by estimation, is 50 to 55%. The  left ventricle has low normal function. The left ventricle has no regional wall motion abnormalities. Left ventricular diastolic parameters were normal.  2. Right ventricular systolic function is normal. The right ventricular size is normal.  3. Left atrial size was moderately dilated.  4. 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  5. The aortic valve is calcified. Aortic valve regurgitation is trivial. Moderate aortic valve stenosis. FINDINGS  Left Ventricle: Left ventricular ejection fraction, by estimation, is 50 to 55%. The left ventricle has low normal function. The left ventricle has no regional wall motion abnormalities. The left ventricular internal cavity size was normal in size. There is no left ventricular hypertrophy. Left ventricular diastolic parameters were normal. Right Ventricle: The right ventricular size is normal. No increase in right ventricular wall thickness. Right ventricular systolic function is normal. Left Atrium: Left atrial size was moderately dilated. Right Atrium: Right atrial size was normal in size. Pericardium: There is no evidence of pericardial effusion. 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 Mild mitral valve stenosis. MV peak gradient, 19.2 mmHg. The mean mitral valve gradient is 8.0 mmHg. Tricuspid Valve: The tricuspid valve is normal in structure. Tricuspid valve regurgitation is mild. Aortic Valve: The aortic valve is calcified. Aortic valve regurgitation is trivial. Moderate aortic stenosis is present. Aortic valve mean gradient measures 26.0 mmHg. Aortic valve peak gradient measures 44.3 mmHg. Aortic valve area, by VTI measures 0.60  cm. Pulmonic Valve: The pulmonic valve was normal in structure. Pulmonic valve regurgitation is not visualized. Aorta: The aortic root and  ascending aorta are structurally normal, with no evidence of dilitation. IAS/Shunts: No atrial level shunt detected by color flow Doppler.  LEFT VENTRICLE PLAX 2D LVIDd:         4.30 cm LVIDs:         2.53 cm LV PW:         0.98 cm LV IVS:        0.62 cm LVOT diam:     2.00 cm LV SV:         45 LV SV Index:   24 LVOT Area:     3.14 cm  RIGHT VENTRICLE RV Basal diam:  5.72 cm RV S prime:     9.68 cm/s TAPSE (M-mode): 3.6 cm LEFT ATRIUM              Index       RIGHT ATRIUM           Index LA diam:        5.10 cm  2.74 cm/m  RA Area:     24.20 cm LA Vol (A2C):   147.0 ml 78.93 ml/m RA Volume:   82.10 ml  44.08 ml/m LA Vol (A4C):   100.0 ml 53.70 ml/m LA Biplane Vol: 121.0 ml 64.97 ml/m  AORTIC VALVE AV Area (Vmax):    0.52 cm AV Area (Vmean):   0.54 cm AV Area (VTI):     0.60 cm AV Vmax:           332.75 cm/s AV Vmean:          234.750 cm/s AV VTI:            0.739 m AV Peak Grad:      44.3 mmHg AV Mean Grad:      26.0 mmHg LVOT Vmax:         55.40 cm/s LVOT Vmean:  40.500 cm/s LVOT VTI:          0.142 m LVOT/AV VTI ratio: 0.19  AORTA Ao Root diam: 2.00 cm MITRAL VALVE                TRICUSPID VALVE MV Area (PHT): 2.37 cm     TR Peak grad:   49.6 mmHg MV Peak grad:  19.2 mmHg    TR Vmax:        352.00 cm/s MV Mean grad:  8.0 mmHg MV Vmax:       2.19 m/s     SHUNTS MV Vmean:      131.0 cm/s   Systemic VTI:  0.14 m MV Decel Time: 320 msec     Systemic Diam: 2.00 cm MV E velocity: 169.00 cm/s MV A velocity: 164.00 cm/s MV E/A ratio:  1.03 Serafina Royals MD Electronically signed by Serafina Royals MD Signature Date/Time: 01/28/2020/6:32:58 AM    Final    CT HEAD CODE STROKE WO CONTRAST  Result Date: 01/26/2020 CLINICAL DATA:  Code stroke. Left-sided collect and upper extremity weakness. EXAM: CT HEAD WITHOUT CONTRAST TECHNIQUE: Contiguous axial images were obtained from the base of the skull through the vertex without intravenous contrast. COMPARISON:  01/03/2018 head and neck CTA FINDINGS: The study  is mildly motion degraded despite repeat imaging. Brain: There are small cortical infarcts in the posterior right frontal lobe (precentral gyrus extending into the centrum semiovale) and right parietal lobe which are new from the CT and of indeterminate age though may be acute or subacute. There is also a new age indeterminate lacunar infarct in the right caudate head. No acute intracranial hemorrhage, mass, midline shift, or extra-axial fluid collection is identified. There is mild cerebral atrophy. Hypodensities in the cerebral white matter bilaterally are nonspecific but compatible with mild chronic small vessel ischemic disease. Vascular: Calcified atherosclerosis at the skull base. No hyperdense vessel. Skull: No fracture or suspicious osseous lesion. Sinuses/Orbits: Chronic right sphenoid sinusitis. Small chronic right mastoid effusion. Bilateral cataract extraction. Other: None. ASPECTS [facility] Stroke Program Early CT Score) - Ganglionic level infarction (caudate, lentiform nuclei, internal capsule, insula, M1-M3 cortex): 6 - Supraganglionic infarction (M4-M6 cortex): 2 Total score (0-10 with 10 being normal): 8 IMPRESSION: 1. Small, potentially acute or subacute infarcts in the right frontal lobe, right parietal lobe, and right basal ganglia. No hemorrhage. 2. ASPECTS is 8. 3. Mild chronic small vessel ischemic disease. These results were called by telephone at the time of interpretation on 01/26/2020 at 3:56 pm to Dr. Vladimir Crofts, who verbally acknowledged these results. Electronically Signed   By: Logan Bores M.D.   On: 01/26/2020 15:57     Assessment and Recommendation  85 y.o. male with known coronary artery disease status post coronary artery bypass graft hypertension hyperlipidemia peripheral vascular disease coronary atherosclerosis status post pacemaker placement for heart block chronic kidney disease stage III aortic valve stenosis status post aortic valve replacement moderate mitral stenosis  and mitral regurgitation with recent falling consistent with acute stroke and now having an acute non-ST elevation myocardial infarction multifactorial in nature including having severe anemia now improved with a hemoglobin improvements  Non-ST elevation myocardial infarction Patient has had a non-ST elevation myocardial infarction from multiple factors including severe anemia chronic kidney disease and coronary artery disease hypertension hyperlipidemia.     Valvular heart disease Patient does have valvular heart disease with moderate aortic valve stenosis and mild to moderate mitral stenosis with normal LV systolic function by echocardiogram.  These are not significantly changed in recent years and have been medically managed reasonably.  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  Continue supportive care for acute stroke and acute non-ST elevation myocardial infarction from multiple factors including acute anemia chronic kidney disease 2.  No further cardiac diagnostics necessary at this time due to stability from the cardiovascular standpoint with a paced rhythm 3.  Addition of metoprolol if patient can tolerate hemodynamically from the blood pressure standpoint.  There will likely be no evidence of concerns for bradycardia due to pacemaker backup.  Will allow critical care team to make this decision on timing due to current intubation 4.  Patient has had added medication management for non-ST elevation myocardial infarction as well as stroke with dual antiplatelet therapy and would continue this medication management for up to a year 5.  Proceed to extubation and/or any other medical therapy or rehabilitation as necessary without restriction  from the cardiovascular standpoint    Signed, Serafina Royals M.D. FACC

## 2020-01-29 NOTE — Progress Notes (Signed)
Pt extubated this afternoon. Will assess swallowing 11/23.

## 2020-01-29 NOTE — Progress Notes (Signed)
Pt. Extubated to 2 lnc,sat 100,rr 18,hr 82. No apparent distress noted at this time.

## 2020-01-29 NOTE — Progress Notes (Signed)
Neurology Progress Note   Subjective: - on ventilator but follows commands. On low vent settings   Exam: Vitals:   01/29/20 0900 01/29/20 1000  BP: (!) 130/55 (!) 140/49  Pulse: 77 85  Resp: 11 12  Temp: 98.8 F (37.1 C) 98.6 F (37 C)  SpO2: 100% 100%   Gen: In bed, comfortable  Resp: non-labored breathing, comfortable no vent Cardiac: Perfusing extremities well, color much improved  Abd: soft, nt  Neuro: MS: Awakens and follows some simple commands (thumbs up, but does not show 2 fingers, protrudes tongue to command) CN: Persistent right gaze preference, left field cut. PERRL 2-> 1 mm. Tongue midline  Motor: Less briskly moving RUE and RLE, through very tremulous with any movement. Continues to have increased tone of the LUE. 3x flexion of the LLE, no movement of the LUE to noxious stim Sensory: Winces to pain in the LUE briskly. LLE he nods that he can feel my maximal noxious stim but does not wince. Reacts readily to touch on the right arm and leg.  DTR: Increased on the left compared to the right   Pertinent Labs: - Creatinine uptrending 11/21 to 2.33 - Hgb remains stable at ~8   Lab Results  Component Value Date   HGBA1C 5.6 01/27/2020   Lab Results  Component Value Date   CHOL 47 01/27/2020   HDL 28 (L) 01/27/2020   LDLCALC 8 01/27/2020   TRIG 54 01/27/2020   CHOLHDL 1.7 01/27/2020    ECHO EF 50-55%, moderately dilated LA, mild MR and MS, mod AS  Impression: This is an 85 y.o. male who continues to have symptoms concerning for a right MCA syndrome. Most likely hypoperfusion infarct secondary to anemia/hypotension given symptoms have not improved with stabilization of BP and blood counts; imaging today does not show occlusion of culprit carotid though atheroembolic or cardioembolic etiologies remain possible. Will not be able to definitely determine etiology with data available at the the time of the event, unfortunately, though this does not substantially  change management at this time. At this time, he is approaching peak swelling window (3-5 days post event, which ends 11/24 afternoon), and therefore continues to merit close monitoring.  Neurology is additionally asked to comment on prognosis.  Given his left MCA territory stroke and based on serial examinations, and I am concerned he may not regain much movement in the left arm and leg.     Recommendations: [place] repat 11/20 expected evolution of infarct.  Acute ischemic strokes in the setting of GI bleed and known carotid stenosis  Carotid dopplers completed: Right ICA stenosis estimated at 50-69%; Left ICA stenosis estimated greater than 70%, chronic L-vert occlusion redemonstrated Currently on ASA 81mg  and Plavix 75 daily  Will need aggressive pt/ot

## 2020-01-29 NOTE — Progress Notes (Addendum)
[facility] hospitalist acceptance note  Patient is an 85 year old male history significant for coronary artery disease status post CABG, complete heart block status post PPM, atrial fibrillation, aortic stenosis, severe vasculopathy who presented after a mechanical fall.  Unknown time down.  Was altered and lethargic on presentation however was able to verbalize that he wanted everything done.  Patient was emergently intubated for airway protection by critical care physician.  CT on admission demonstrates acute infarct in the right frontal and parietal lobe as well as the basal ganglia.  Per patient's wife the patient also had several episodes of hematemesis the week of presentation.  Neurology and gastroenterology consulted for admission.  Per neurology patient not a TPA candidate.  Patient underwent EGD with gastroenterology 03/30/2019.  No acute bleed noted.  Patient remains on twice daily Protonix at this time.  Patient was successfully extubated 01/29/2020.  He is currently on nasal cannula at 2 L.  Vital signs remained stable.  He is currently stepdown status.  [facility] hospitalist service to assume primary care of this patient on 01/30/2020.  CODE STATUS now changed to DNR. Case discussed with PCCM attending Dr. Lovenia Shuck MD

## 2020-01-29 NOTE — TOC Initial Note (Signed)
Transition of Care [facility]) - Initial/Assessment Note    Patient Details  Name: Eric Kennedy MRN: [id number] Date of Birth: [birth year]  [facility]) CM/SW Contact:    Ova Freshwater Phone Number: [phone] 01/29/2020, 4:07 PM  Clinical Narrative:                  CSW met with patient and family, spouse [name] and Soul Hackman (son) [phone], [place]. Patient was able to participate in conversation by nodding or shaking his head. CSW explained the role of TOC in patient care.  CSW asked patient and his wife [name] about ADLs.  Patient has been able to perform most ADLs without assistance.  Patient did nod when CSW asked if he was having difficulty making it to the bathroom on time.  Patient also nodded when CSW asked about driving himself to doctor's appointments and going to the grocery store.  Patient nodded when CSW asked about taking medications as prescribed.  Ms. Kagawa stated the patient does use a cane or walker at home but he did use hers in the last few days when he wasn't not feeling well. Patient nodded when CSW asked about doing yardwork.  CSW then spoke privately with patient's son Avelardo Reesman, and he inquired about patient going to SNF or home with home health.  CSW explained the SNF anf HH process and timeline to Mr Fenlon, and reminded Mr Gillen that although he was HCPOA, if the patient has capacity we would need to do what he prefers.  Mr. Lamour verbalized understanding.  CSW explained to Mr. Staley about assests and Medicaid applications and gave him websites for Medicare.gov and Medicaid.gov to research.  Mr. Arvella Nigh verbalized understanding.  Expected Discharge Plan: Skilled Nursing Facility Barriers to Discharge: Continued Medical Work up   Patient Goals and CMS Choice Patient states their goals for this hospitalization and ongoing recovery are:: Patient nodded when CSW asked if he would like to return home with home health. CMS Medicare.gov Compare Post Acute  Care list provided to:: Patient Represenative (must comment) Dmani Mizer (son) [phone]) Choice offered to / list presented to : Adult Children Jamaree Hosier (son) [phone])  Expected Discharge Plan and Services Expected Discharge Plan: [place] In-house Referral: Clinical Social Work   Post Acute Care Choice: [place] arrangements for the past 2 months: [place]                                      Prior Living Arrangements/Services Living arrangements for the past 2 months: Single Family Home Lives with:: Spouse Patient language and need for interpreter reviewed:: Yes Do you feel safe going back to the place where you live?: Yes      Need for Family Participation in Patient Care: Yes (Comment) Care giver support system in place?: Yes (comment)   Criminal Activity/Legal Involvement Pertinent to Current Situation/Hospitalization: No - Comment as needed  Activities of Daily Living      Permission Sought/Granted Permission sought to share information with : Family Supports Permission granted to share information with : Yes, Verbal Permission Granted  Share Information with NAME: Harsha Yusko (son) [phone]           Emotional Assessment Appearance:: Appears stated age Attitude/Demeanor/Rapport: Gracious Affect (typically observed): Stable Orientation: : Oriented to Self, Oriented to Place, Oriented to Situation Alcohol / Substance Use: Not Applicable Psych  Involvement: No (comment)  Admission diagnosis:  Hemorrhagic shock ([place]) [R57.8] Microcytic anemia [D50.9] Upper GI bleed [K92.2] Fall [W19.XXXA] Stroke-like symptom [R29.90] Fall, initial encounter [W19.XXXA] Cerebral ischemic stroke due to global hypoperfusion with watershed infarct [facility]) [I63.9] Patient Active Problem List   Diagnosis Date Noted  . Upper GI bleed 01/26/2020  . Complete heart block ([place]) 06/16/2018  . Healthcare maintenance 06/06/2018  .  Controlled type 2 diabetes mellitus with complication, without long-term current use of insulin ([place]) 12/02/2017  . Hypertension, well controlled 12/02/2017  . Chronic anemia 06/01/2017  . 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 Vitamin B 12 deficiency 11/26/2015  . Pure hypercholesterolemia 08/27/2015  . Severe aortic valve stenosis 06/18/2015  . Acquired hypothyroidism 02/14/2014  . Adhesive capsulitis of left shoulder 08/25/2013  . 3-vessel CAD 05/16/2013  . Hx of CABG 05/16/2013  . Carotid stenosis 05/16/2013  . Paroxysmal atrial fibrillation ([place]) 05/16/2013   PCP:  [name], MD Pharmacy:   [facility], [place] [address] Phone: [phone] Fax: [phone]  [place], [place] [address] [place] [address] Phone: [phone] Fax: [phone]     Social Determinants of Health (SDOH) Interventions    Readmission Risk Interventions No flowsheet data found.

## 2020-01-29 NOTE — Progress Notes (Addendum)
Follow up - Critical Care Medicine Note  Patient Details:    Eric Kennedy is an 85 y.o. male presented to the emergency room after being found down by patient's wife.  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  Patient has left dense hemiparesis.  Patient also was noted to have severe anemia with hemoglobin of 4.7.  Patient has severe aortic stenosis and carotid stenosis.  Lines, Airways, Drains: Airway 8 mm (Active)  Secured at (cm) 23 cm 01/29/20 1200  Measured From Lips 01/29/20 [place] 01/29/20 0816  Secured By [employer] 01/29/20 1200  Tube Holder Repositioned Yes 01/29/20 0227  Prone position No 01/29/20 0816  Cuff Pressure (cm H2O) 18 cm H2O 01/29/20 0816  Site Condition Dry 01/29/20 0816     Urethral Catheter  Non-latex 16 Fr. (Active)  Indication for Insertion or Continuance of Catheter Therapy based on hourly urine output monitoring and documentation for critical condition (NOT STRICT I&O) 01/29/20 0800  Site Assessment Clean;Intact 01/29/20 0800  Catheter Maintenance Bag below level of bladder;Catheter secured;Drainage bag/tubing not touching floor;No dependent loops;Seal intact 01/29/20 0800  Collection Container Standard drainage bag 01/29/20 0800  Securement Method Securing device (Describe) 01/29/20 0800  Urinary Catheter Interventions (if applicable) Unclamped 98/33/82 2000  Output (mL) 675 mL 01/29/20 1200    Anti-infectives:  Anti-infectives (From admission, onward)   Start     Dose/Rate Route Frequency Ordered Stop   01/26/20 1845  cefTRIAXone (ROCEPHIN) 1 g in sodium chloride 0.9 % 100 mL IVPB  Status:  Discontinued        1 g 200 mL/hr over 30 Minutes Intravenous Every 24 hours 01/26/20 1831 01/29/20 1016     No current facility-administered medications on file prior to encounter.   Current Outpatient Medications on File Prior to Encounter  Medication Sig Dispense Refill  . amLODipine (NORVASC)  10 MG tablet Take 10 mg by mouth daily.     Marland Kitchen aspirin EC 81 MG tablet Take 81 mg by mouth daily.     Marland Kitchen levothyroxine (SYNTHROID) 50 MCG tablet Take 50 mcg by mouth daily.     . Multiple Vitamins-Minerals (PX COMPLETE SENIOR MULTIVITS) TABS Take 1 tablet by mouth daily.     Marland Kitchen omeprazole (PRILOSEC) 40 MG capsule Take 40 mg by mouth daily.     . rosuvastatin (CRESTOR) 40 MG tablet Take 40 mg by mouth daily.     Marland Kitchen telmisartan (MICARDIS) 80 MG tablet Take 80 mg by mouth daily.       Microbiology: Results for orders placed or performed during the hospital encounter of 01/26/20  Resp Panel by RT-PCR (Flu A&B, Covid) Nasopharyngeal Swab     Status: None   Collection Time: 01/26/20  6:29 PM   Specimen: Nasopharyngeal Swab; Nasopharyngeal(NP) swabs in vial transport medium  Result Value Ref Range Status   SARS Coronavirus 2 by RT PCR NEGATIVE NEGATIVE Final    Comment: (NOTE) SARS-CoV-2 target nucleic acids are NOT DETECTED.  The SARS-CoV-2 RNA is generally detectable in upper respiratory specimens during the acute phase of infection. The lowest concentration of SARS-CoV-2 viral copies this assay can detect is 138 copies/mL. A negative result does not preclude SARS-Cov-2 infection and should not be used as the sole basis for treatment or other patient management decisions. A negative result may occur with  improper specimen collection/handling, submission of specimen other than nasopharyngeal swab, presence of viral mutation(s) within the areas targeted by this assay, and  inadequate number of viral copies(<138 copies/mL). A negative result must be combined with clinical observations, patient history, and epidemiological information. The expected result is Negative.  Fact Sheet for Patients:  EntrepreneurPulse.com.au  Fact Sheet for Healthcare Providers:  IncredibleEmployment.be  This test is no t yet approved or cleared by the [place] FDA and  has  been authorized for detection and/or diagnosis of SARS-CoV-2 by FDA under an Emergency Use Authorization (EUA). This EUA will remain  in effect (meaning this test can be used) for the duration of the COVID-19 declaration under Section 564(b)(1) of the Act, 21 U.S.C.section 360bbb-3(b)(1), unless the authorization is terminated  or revoked sooner.       Influenza A by PCR NEGATIVE NEGATIVE Final   Influenza B by PCR NEGATIVE NEGATIVE Final    Comment: (NOTE) The Xpert Xpress SARS-CoV-2/FLU/RSV plus assay is intended as an aid in the diagnosis of influenza from Nasopharyngeal swab specimens and should not be used as a sole basis for treatment. Nasal washings and aspirates are unacceptable for Xpert Xpress SARS-CoV-2/FLU/RSV testing.  Fact Sheet for Patients: EntrepreneurPulse.com.au  Fact Sheet for Healthcare Providers: IncredibleEmployment.be  This test is not yet approved or cleared by the [place] FDA and has been authorized for detection and/or diagnosis of SARS-CoV-2 by FDA under an Emergency Use Authorization (EUA). This EUA will remain in effect (meaning this test can be used) for the duration of the COVID-19 declaration under Section 564(b)(1) of the Act, 21 U.S.C. section 360bbb-3(b)(1), unless the authorization is terminated or revoked.  Performed at [facility], [address] [place], [place] [address]   Culture, blood (routine x 2)     Status: None (Preliminary result)   Collection Time: 01/26/20  6:57 PM   Specimen: BLOOD  Result Value Ref Range Status   Specimen Description BLOOD LEFT ANTECUBITAL  Final   Special Requests   Final    BOTTLES DRAWN AEROBIC AND ANAEROBIC Blood Culture adequate volume   Culture   Final    NO GROWTH 3 DAYS Performed at [facility], [address], [place], [place] [address]    Report Status PENDING  Incomplete  Culture, blood (routine x 2)     Status: None  (Preliminary result)   Collection Time: 01/26/20  9:10 PM   Specimen: BLOOD  Result Value Ref Range Status   Specimen Description BLOOD BLOOD LEFT HAND  Final   Special Requests   Final    BOTTLES DRAWN AEROBIC AND ANAEROBIC Blood Culture adequate volume   Culture   Final    NO GROWTH 3 DAYS Performed at [facility], [address] [place], [place] [address]    Report Status PENDING  Incomplete  MRSA PCR Screening     Status: None   Collection Time: 01/26/20  9:14 PM   Specimen: Nasopharyngeal  Result Value Ref Range Status   MRSA by PCR NEGATIVE NEGATIVE Final    Comment:        The GeneXpert MRSA Assay (FDA approved for NASAL specimens only), is one component of a comprehensive MRSA colonization surveillance program. It is not intended to diagnose MRSA infection nor to guide or monitor treatment for MRSA infections. Performed at [facility], [address] [place], [place] [address]    Results for orders placed or performed during the hospital encounter of 01/26/20 (from the past 24 hour(s))  CBC     Status: Abnormal   Collection Time: 01/29/20  2:46 AM  Result Value  Ref Range   WBC 10.2 4.0 - 10.5 K/uL   RBC 3.69 (L) 4.22 - 5.81 MIL/uL   Hemoglobin 8.5 (L) 13.0 - 17.0 g/dL   HCT 30.0 (L) 39 - 52 %   MCV 81.3 80.0 - 100.0 fL   MCH 23.0 (L) 26.0 - 34.0 pg   MCHC 28.3 (L) 30.0 - 36.0 g/dL   RDW 23.2 (H) 11.5 - 15.5 %   Platelets 176 150 - 400 K/uL   nRBC 0.0 0.0 - 0.2 %  Basic metabolic panel     Status: Abnormal   Collection Time: 01/29/20  2:46 AM  Result Value Ref Range   Sodium 145 135 - 145 mmol/L   Potassium 4.1 3.5 - 5.1 mmol/L   Chloride 116 (H) 98 - 111 mmol/L   CO2 16 (L) 22 - 32 mmol/L   Glucose, Bld 97 70 - 99 mg/dL   BUN 42 (H) 8 - 23 mg/dL   Creatinine, Ser 2.64 (H) 0.61 - 1.24 mg/dL   Calcium 8.9 8.9 - 10.3 mg/dL   GFR, Estimated 23 (L) >60 mL/min   Anion gap 13 5 - 15      Best Practice/Protocols:  VTE Prophylaxis:  Mechanical GI Prophylaxis: Proton Pump Inhibitor Vent wean  Events: 01/28/2020: Goals of care discussion, patient DNR.   Studies: DG Chest 1 View  Result Date: 01/26/2020 CLINICAL DATA:  85 year old male found down. EXAM: CHEST  1 VIEW COMPARISON:  None. FINDINGS: Portable AP supine view at 1644 hours. Prior sternotomy, cardiac valve replacement. Left chest dual lead cardiac pacemaker. Cardiac size and mediastinal contours are within normal limits. Mildly low lung volumes. Asymmetric left lateral and costophrenic angle pleural density. Mild associated patchy peripheral opacity in the left lung. Mild streaky opacity in the left mid lung more resembling atelectasis or scarring. Allowing for portable technique the right lung is clear. No pneumothorax is evident. No rib fracture is identified. Grossly intact other visible osseous structures. Negative visible bowel gas pattern. IMPRESSION: 1. Suspicion of a small left pleural effusion, and patchy nonspecific opacity in the left mid lung. No adjacent rib fracture is identified radiographically. 2. No other acute cardiopulmonary abnormality. Electronically Signed   By: Genevie Ann M.D.   On: 01/26/2020 17:18   DG Pelvis 1-2 Views  Result Date: 01/26/2020 CLINICAL DATA:  85 year old male found down. EXAM: PELVIS - 1-2 VIEW COMPARISON:  None. FINDINGS: Portable AP supine view at 1643 hours. Femoral heads are normally located. Hip joint spaces are normal for age. The pelvis appears intact. Grossly intact proximal femurs. Calcified femoral artery atherosclerosis. Oval circumscribed probable calcified phlebolith in the right central pelvis. Negative visible bowel gas pattern. IMPRESSION: No acute fracture or dislocation identified about the pelvis. Electronically Signed   By: Genevie Ann M.D.   On: 01/26/2020 17:19   DG Abdomen 1 View  Result Date: 01/26/2020 CLINICAL DATA:  Evaluate OG tube EXAM: ABDOMEN - 1 VIEW COMPARISON:  None. FINDINGS: The OG tube  terminates in left upper quadrant, within the stomach. Is difficult to clearly see the side port but I believe the side port is likely in the stomach. IMPRESSION: The OG tube appears to be in good position. Electronically Signed   By: Dorise Bullion III M.D   On: 01/26/2020 19:34   CT HEAD WO CONTRAST  Result Date: 01/27/2020 CLINICAL DATA:  85 year old male found down. Left side weakness with evidence of acute to subacute infarcts in the right MCA territory on presentation head CT  1540 hours 01/26/2020. EXAM: CT HEAD WITHOUT CONTRAST TECHNIQUE: Contiguous axial images were obtained from the base of the skull through the vertex without intravenous contrast. COMPARISON:  Head CTs at 15:40 and 2040 hours yesterday. FINDINGS: Brain: Multifocal evolving cytotoxic edema in the posterior right hemisphere and along the right MCA/PCA watershed area. Areas involved include the superior perirolandic cortex (series 2 images 25 through 27), right parietal lobe, lateral right occipital lobe. No associated acute hemorrhage or intracranial mass effect. Stable gray-white matter differentiation elsewhere since early yesterday. No ventriculomegaly. Vascular: Calcified atherosclerosis at the skull base. No suspicious intracranial vascular hyperdensity. Skull: No acute osseous abnormality identified. Sinuses/Orbits: Stable. Chronic right sphenoid sinusitis. Trace mastoid fluid. Other: No acute orbit or scalp soft tissue finding. IMPRESSION: 1. Evolving cortical infarcts in the posterior Right MCA and also the Right MCA/PCA watershed area. No associated hemorrhage or mass effect. 2. Stable CT appearance of the brain elsewhere. Electronically Signed   By: Genevie Ann M.D.   On: 01/27/2020 12:08   CT HEAD WO CONTRAST  Result Date: 01/26/2020 CLINICAL DATA:  Found down EXAM: CT HEAD WITHOUT CONTRAST TECHNIQUE: Contiguous axial images were obtained from the base of the skull through the vertex without intravenous contrast.  COMPARISON:  01/26/2020 FINDINGS: Brain: Again noted over the small areas of potential acute to subacute infarcts within the right frontal, right parietal lobes. No new suspicious areas for acute infarction. There is atrophy and chronic small vessel disease changes. No hydrocephalus. No hemorrhage. Vascular: No hyperdense vessel or unexpected calcification. Skull: No acute calvarial abnormality. Sinuses/Orbits: Mucosal thickening in the right sphenoid sinus. No acute findings. Other: None IMPRESSION: Stable small low-density areas within the right frontal and parietal lobes which could reflect small acute or subacute infarcts, unchanged since prior study. No hemorrhage. Electronically Signed   By: Rolm Baptise M.D.   On: 01/26/2020 20:49   US Carotid Bilateral  Result Date: 01/27/2020 CLINICAL DATA:  Stroke symptoms, hyperlipidemia and diabetes EXAM: BILATERAL CAROTID DUPLEX ULTRASOUND TECHNIQUE: Pearline Cables scale imaging, color Doppler and duplex ultrasound were performed of bilateral carotid and vertebral arteries in the neck. COMPARISON:  None. FINDINGS: Criteria: Quantification of carotid stenosis is based on velocity parameters that correlate the residual internal carotid diameter with NASCET-based stenosis levels, using the diameter of the distal internal carotid lumen as the denominator for stenosis measurement. The following velocity measurements were obtained: RIGHT ICA: 207/24 cm/sec CCA: 854/62 cm/sec SYSTOLIC ICA/CCA RATIO:  2.0 ECA: 179 cm/sec LEFT ICA: 255/25 cm/sec CCA: 70/35 cm/sec SYSTOLIC ICA/CCA RATIO:  2.6 ECA: 280 cm/sec RIGHT CAROTID ARTERY: Intimal thickening and mixed echogenicity heterogeneous carotid bifurcation atherosclerosis. Mid ICA velocity elevation measures 207/24 centimeters/second with mild turbulent flow and spectral broadening. Right ICA stenosis estimated at 50-69% by ultrasound criteria. RIGHT VERTEBRAL ARTERY:  Normal antegrade flow LEFT CAROTID ARTERY: Extensive calcific  atherosclerosis of the left carotid bifurcation. Left proximal ICA velocity elevation measures up to 255/25 centimeters/second with turbulent flow and spectral broadening. Left ICA stenosis meets criteria for a greater than 70% stenosis. LEFT VERTEBRAL ARTERY:  Not visualized, suspect occluded IMPRESSION: Right ICA stenosis estimated at 50-69% Left ICA stenosis estimated greater than 70% Patent antegrade flow in the right vertebral artery Nonvisualized left vertebral artery, suspect occluded Electronically Signed   By: Jerilynn Mages.  Shick M.D.   On: 01/27/2020 09:18   DG Chest Port 1 View  Result Date: 01/27/2020 CLINICAL DATA:  Respiratory failure. EXAM: PORTABLE CHEST 1 VIEW COMPARISON:  01/26/2020 and prior radiographs FINDINGS: An endotracheal  tube with tip 3 cm above the carina and OG tube entering the stomach again noted. Pulmonary vascular congestion is again noted. Bilateral LOWER lung opacities have slightly increased from the prior study. No pneumothorax or large pleural effusion noted. Cardiac surgical changes and LEFT-sided pacemaker again noted. IMPRESSION: Slightly increased bilateral LOWER lung opacities/atelectasis without other significant change. Electronically Signed   By: Margarette Canada M.D.   On: 01/27/2020 07:31   DG Chest Portable 1 View  Result Date: 01/26/2020 CLINICAL DATA:  Evaluate OG tube an ETT placement. EXAM: PORTABLE CHEST 1 VIEW COMPARISON:  January 26, 2020 FINDINGS: An ET tube is been placed in the interval. The distal tip appears to be 3 cm above the carina in good position. The distal tip of the NG tube is in the region of the stomach. The side port is not well seen on this study. The cardiomediastinal silhouette is stable. Probable pulmonary venous congestion. Opacity in the periphery of the left mid lower lung is stable. No other acute abnormalities. No pneumothorax. Small left effusion. IMPRESSION: 1. The distal tip of the NG tube is in the stomach. The side port is difficult  to see on this study. The ETT is in good position. 2. Persistent opacity in the periphery of the left mid lower lung with a small left effusion. Electronically Signed   By: Dorise Bullion III M.D   On: 01/26/2020 19:33   ECHOCARDIOGRAM COMPLETE  Result Date: 01/28/2020    ECHOCARDIOGRAM REPORT   Patient Name:   Jasdeep Mierzejewski Date of Exam: 01/27/2020 Medical Rec #:  [id number]   Height:       68.0 in Accession #:    [id number]  Weight:       160.7 lb Date of Birth:  November 19, 1934   BSA:          1.862 m Patient Age:    29 years    BP:           116/49 mmHg Patient Gender: M           HR:           60 bpm. Exam Location:  [facility] Procedure: 2D Echo, Cardiac Doppler and Color Doppler Indications:     Stroke 434.91  History:         Patient has no prior history of Echocardiogram examinations.                  Prior CABG; Risk Factors:Hypertension and Diabetes.  Sonographer:     Sherrie Sport RDCS (AE) Referring Phys:  [id number] Lorenza Chick Diagnosing Phys: Serafina Royals MD  Sonographer Comments: Echo performed with patient supine and on artificial respirator and suboptimal apical window. IMPRESSIONS  1. Left ventricular ejection fraction, by estimation, is 50 to 55%. The left ventricle has low normal function. The left ventricle has no regional wall motion abnormalities. Left ventricular diastolic parameters were normal.  2. Right ventricular systolic function is normal. The right ventricular size is normal.  3. Left atrial size was moderately dilated.  4. 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  5. The aortic valve is calcified. Aortic valve regurgitation is trivial. Moderate aortic valve stenosis. FINDINGS  Left Ventricle: Left ventricular ejection fraction, by estimation, is 50 to 55%. The left ventricle has low normal function. The left ventricle has no regional wall motion abnormalities. The left ventricular internal cavity size was normal in size. There is no left  ventricular hypertrophy.  Left ventricular diastolic parameters were normal. Right Ventricle: The right ventricular size is normal. No increase in right ventricular wall thickness. Right ventricular systolic function is normal. Left Atrium: Left atrial size was moderately dilated. Right Atrium: Right atrial size was normal in size. Pericardium: There is no evidence of pericardial effusion. 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 Mild mitral valve stenosis. MV peak gradient, 19.2 mmHg. The mean mitral valve gradient is 8.0 mmHg. Tricuspid Valve: The tricuspid valve is normal in structure. Tricuspid valve regurgitation is mild. Aortic Valve: The aortic valve is calcified. Aortic valve regurgitation is trivial. Moderate aortic stenosis is present. Aortic valve mean gradient measures 26.0 mmHg. Aortic valve peak gradient measures 44.3 mmHg. Aortic valve area, by VTI measures 0.60  cm. Pulmonic Valve: The pulmonic valve was normal in structure. Pulmonic valve regurgitation is not visualized. Aorta: The aortic root and ascending aorta are structurally normal, with no evidence of dilitation. IAS/Shunts: No atrial level shunt detected by color flow Doppler.  LEFT VENTRICLE PLAX 2D LVIDd:         4.30 cm LVIDs:         2.53 cm LV PW:         0.98 cm LV IVS:        0.62 cm LVOT diam:     2.00 cm LV SV:         45 LV SV Index:   24 LVOT Area:     3.14 cm  RIGHT VENTRICLE RV Basal diam:  5.72 cm RV S prime:     9.68 cm/s TAPSE (M-mode): 3.6 cm LEFT ATRIUM              Index       RIGHT ATRIUM           Index LA diam:        5.10 cm  2.74 cm/m  RA Area:     24.20 cm LA Vol (A2C):   147.0 ml 78.93 ml/m RA Volume:   82.10 ml  44.08 ml/m LA Vol (A4C):   100.0 ml 53.70 ml/m LA Biplane Vol: 121.0 ml 64.97 ml/m  AORTIC VALVE AV Area (Vmax):    0.52 cm AV Area (Vmean):   0.54 cm AV Area (VTI):     0.60 cm AV Vmax:           332.75 cm/s AV Vmean:          234.750 cm/s AV VTI:             0.739 m AV Peak Grad:      44.3 mmHg AV Mean Grad:      26.0 mmHg LVOT Vmax:         55.40 cm/s LVOT Vmean:        40.500 cm/s LVOT VTI:          0.142 m LVOT/AV VTI ratio: 0.19  AORTA Ao Root diam: 2.00 cm MITRAL VALVE                TRICUSPID VALVE MV Area (PHT): 2.37 cm     TR Peak grad:   49.6 mmHg MV Peak grad:  19.2 mmHg    TR Vmax:        352.00 cm/s MV Mean grad:  8.0 mmHg MV Vmax:       2.19 m/s     SHUNTS MV Vmean:      131.0 cm/s   Systemic VTI:  0.14 m  MV Decel Time: 320 msec     Systemic Diam: 2.00 cm MV E velocity: 169.00 cm/s MV A velocity: 164.00 cm/s MV E/A ratio:  1.03 Serafina Royals MD Electronically signed by Serafina Royals MD Signature Date/Time: 01/28/2020/6:32:58 AM    Final    CT HEAD CODE STROKE WO CONTRAST  Result Date: 01/26/2020 CLINICAL DATA:  Code stroke. Left-sided collect and upper extremity weakness. EXAM: CT HEAD WITHOUT CONTRAST TECHNIQUE: Contiguous axial images were obtained from the base of the skull through the vertex without intravenous contrast. COMPARISON:  01/03/2018 head and neck CTA FINDINGS: The study is mildly motion degraded despite repeat imaging. Brain: There are small cortical infarcts in the posterior right frontal lobe (precentral gyrus extending into the centrum semiovale) and right parietal lobe which are new from the CT and of indeterminate age though may be acute or subacute. There is also a new age indeterminate lacunar infarct in the right caudate head. No acute intracranial hemorrhage, mass, midline shift, or extra-axial fluid collection is identified. There is mild cerebral atrophy. Hypodensities in the cerebral white matter bilaterally are nonspecific but compatible with mild chronic small vessel ischemic disease. Vascular: Calcified atherosclerosis at the skull base. No hyperdense vessel. Skull: No fracture or suspicious osseous lesion. Sinuses/Orbits: Chronic right sphenoid sinusitis. Small chronic right mastoid effusion. Bilateral cataract  extraction. Other: None. ASPECTS [facility] Stroke Program Early CT Score) - Ganglionic level infarction (caudate, lentiform nuclei, internal capsule, insula, M1-M3 cortex): 6 - Supraganglionic infarction (M4-M6 cortex): 2 Total score (0-10 with 10 being normal): 8 IMPRESSION: 1. Small, potentially acute or subacute infarcts in the right frontal lobe, right parietal lobe, and right basal ganglia. No hemorrhage. 2. ASPECTS is 8. 3. Mild chronic small vessel ischemic disease. These results were called by telephone at the time of interpretation on 01/26/2020 at 3:56 pm to Dr. Vladimir Crofts, who verbally acknowledged these results. Electronically Signed   By: Logan Bores M.D.   On: 01/26/2020 15:57    Consults: Treatment Team:  Corey Skains, MD [facility], [facility]-, MD   Subjective:    Overnight Issues: No instability overnight.  Off of sedatives, follows commands.  Tolerating SBT  Objective:  Vital signs for last 24 hours: Temp:  [98.4 F (36.9 C)-100.2 F (37.9 C)] 98.4 F (36.9 C) (11/22 1200) Pulse Rate:  [74-97] 80 (11/22 1200) Resp:  [8-22] 12 (11/22 1200) BP: (97-140)/(39-61) 129/44 (11/22 1200) SpO2:  [86 %-100 %] 99 % (11/22 1200) FiO2 (%):  [28 %] 28 % (11/22 1200) Weight:  [80.9 kg] 80.9 kg (11/22 0500)  Hemodynamic parameters for last 24 hours:    Intake/Output from previous day: 11/21 0701 - 11/22 0700 In: 1723 [I.V.:1123; IV Piggyback:600] Out: 685 [Urine:685]  Intake/Output this shift: Total I/O In: 505.2 [I.V.:505.2] Out: 900 [Urine:900]  Vent settings for last 24 hours: Vent Mode: PSV FiO2 (%):  [28 %] 28 % PEEP:  [5 cmH20] 5 cmH20 Pressure Support:  [5 cmH20] 5 cmH20  Physical Exam:  GENERAL: On the ventilator.  Sedation stopped.  He follows commands with his right, dense left hemiparesis. HEAD: Normocephalic, atraumatic.  EYES: Pupils equal, round, reactive to light.  No scleral icterus.  MOUTH: Orotracheally intubated.  NECK: Supple. No thyromegaly.  Trachea midline. No JVD.  No adenopathy. PULMONARY: Good air entry bilaterally.  No adventitious sounds. CARDIOVASCULAR: S1 and S2. Regular rate and rhythm.  Is a grade 3/6 to 4/6 systolic ejection murmur consistent with aortic stenosis left sternal border. ABDOMEN: Benign. MUSCULOSKELETAL: No joint  deformity, no clubbing, no edema.  NEUROLOGIC: Left-sided neglect, left hemiparesis, dense. SKIN: Intact,warm,dry. PSYCH: Cannot assess due to intubated status.  Assessment/Plan:   Acute respiratory failure with ventilator dependence He is meeting criteria for extubation Intubation mostly for airway protection and GI procedures Following commands We will proceed with extubation  Acute blood loss anemia Upper GI bleed Endoscopy revealed gastritis and gastric ulcer Continue PPI twice daily Discontinue Rocephin as no clear-cut indication Hemoglobin and hematocrit stable Midline present   Acute hemorhagic shock Due to severe anemia secondary to UGIB Resolved with transfusion and volume resuscitation H&H has stabilized    Acute CVA RIGHT MCA/PCA territory  -  Neurology on case - appreciate input    - repeat CTH -lipitor 80 Transition to stepdown monitoring after extubation Started aspirin Continue Plavix PT/OT Speech therapy   Renal Failure-Acute KDIGO 3  -follow chem 7 -follow UO -continue Foley Catheter-assess need daily   ID No evidence of infectious process DC Rocephin  GI/Nutrition GI PROPHYLAXIS on PPI twice daily DIET--> speech path evaluation for swallow Constipation protocol as indicated  ENDO - ICU hypoglycemic\Hyperglycemia protocol -check FSBS per protocol   ELECTROLYTES -follow labs as needed -replace as needed -pharmacy consultation    LOS: 3 days   Additional comments: Multidisciplinary rounds were performed with the ICU staff.  Anticipate transfer to [facility] in the a.m.  Critical Care Total Time*: 35  Minutes  C. Derrill Kay, MD [place] [facility] 01/29/2020  *Care during the described time interval was provided by me and/or other providers on the critical care team.  I have reviewed this patient's available data, including medical history, events of note, physical examination and test results as part of my evaluation.  **This note was dictated using voice recognition software/Dragon.  Despite best efforts to proofread, errors can occur which can change the meaning.  Any change was purely unintentional.

## 2020-01-30 ENCOUNTER — Encounter: Payer: Self-pay | Admitting: Pulmonary Disease

## 2020-01-30 DIAGNOSIS — J9601 Acute respiratory failure with hypoxia: Secondary | ICD-10-CM

## 2020-01-30 DIAGNOSIS — I639 Cerebral infarction, unspecified: Secondary | ICD-10-CM | POA: Diagnosis not present

## 2020-01-30 DIAGNOSIS — D62 Acute posthemorrhagic anemia: Secondary | ICD-10-CM

## 2020-01-30 DIAGNOSIS — K922 Gastrointestinal hemorrhage, unspecified: Secondary | ICD-10-CM | POA: Diagnosis not present

## 2020-01-30 DIAGNOSIS — I63511 Cerebral infarction due to unspecified occlusion or stenosis of right middle cerebral artery: Secondary | ICD-10-CM

## 2020-01-30 LAB — CBC WITH DIFFERENTIAL/PLATELET
Abs Immature Granulocytes: 0.03 10*3/uL (ref 0.00–0.07)
Basophils Absolute: 0 10*3/uL (ref 0.0–0.1)
Basophils Relative: 0 %
Eosinophils Absolute: 0 10*3/uL (ref 0.0–0.5)
Eosinophils Relative: 0 %
HCT: 33.5 % — ABNORMAL LOW (ref 39.0–52.0)
Hemoglobin: 9.8 g/dL — ABNORMAL LOW (ref 13.0–17.0)
Immature Granulocytes: 0 %
Lymphocytes Relative: 9 %
Lymphs Abs: 0.6 10*3/uL — ABNORMAL LOW (ref 0.7–4.0)
MCH: 23.1 pg — ABNORMAL LOW (ref 26.0–34.0)
MCHC: 29.3 g/dL — ABNORMAL LOW (ref 30.0–36.0)
MCV: 78.8 fL — ABNORMAL LOW (ref 80.0–100.0)
Monocytes Absolute: 0.5 10*3/uL (ref 0.1–1.0)
Monocytes Relative: 6 %
Neutro Abs: 6.1 10*3/uL (ref 1.7–7.7)
Neutrophils Relative %: 85 %
Platelets: 182 10*3/uL (ref 150–400)
RBC: 4.25 MIL/uL (ref 4.22–5.81)
RDW: 24 % — ABNORMAL HIGH (ref 11.5–15.5)
WBC: 7.3 10*3/uL (ref 4.0–10.5)
nRBC: 0 % (ref 0.0–0.2)

## 2020-01-30 LAB — BASIC METABOLIC PANEL
Anion gap: 13 (ref 5–15)
BUN: 36 mg/dL — ABNORMAL HIGH (ref 8–23)
CO2: 17 mmol/L — ABNORMAL LOW (ref 22–32)
Calcium: 9.2 mg/dL (ref 8.9–10.3)
Chloride: 118 mmol/L — ABNORMAL HIGH (ref 98–111)
Creatinine, Ser: 2.29 mg/dL — ABNORMAL HIGH (ref 0.61–1.24)
GFR, Estimated: 28 mL/min — ABNORMAL LOW (ref >60)
Glucose, Bld: 102 mg/dL — ABNORMAL HIGH (ref 70–99)
Potassium: 3.5 mmol/L (ref 3.5–5.1)
Sodium: 148 mmol/L — ABNORMAL HIGH (ref 135–145)

## 2020-01-30 LAB — PHOSPHORUS: Phosphorus: 3.1 mg/dL (ref 2.5–4.6)

## 2020-01-30 LAB — MAGNESIUM: Magnesium: 2.3 mg/dL (ref 1.7–2.4)

## 2020-01-30 MED ORDER — DEXTROSE 5 % IV SOLN: INTRAVENOUS | Status: DC

## 2020-01-30 MED ORDER — HEPARIN SODIUM (PORCINE) 5000 UNIT/ML IJ SOLN
5000.0000 [IU] | Freq: Three times a day (TID) | INTRAMUSCULAR | Status: DC
  Administered 2020-01-30 – 2020-02-01 (×7): 5000 [IU] via SUBCUTANEOUS
  Filled 2020-01-30 (×7): qty 1

## 2020-01-30 MED ORDER — POTASSIUM CL IN DEXTROSE 5% 20 MEQ/L IV SOLN
20.0000 meq | INTRAVENOUS | Status: DC
  Administered 2020-01-30 – 2020-01-31 (×2): 20 meq via INTRAVENOUS
  Filled 2020-01-30 (×4): qty 1000

## 2020-01-30 NOTE — Evaluation (Addendum)
Clinical/Bedside Swallow Evaluation Patient Details  Name: Eric Kennedy MRN: [id number] Date of Birth: [birth year]  Today's Date: 01/30/2020 Time: SLP Start Time (ACUTE ONLY): 0900 SLP Stop Time (ACUTE ONLY): 0930 SLP Time Calculation (min) (ACUTE ONLY): 30 min  Past Medical History:  Past Medical History:  Diagnosis Date  . Diabetes mellitus without complication ([place])   . Hypertension    Past Surgical History:  Past Surgical History:  Procedure Laterality Date  . CARDIAC SURGERY     triple bypass  . ESOPHAGOGASTRODUODENOSCOPY N/A 01/28/2020   Procedure: ESOPHAGOGASTRODUODENOSCOPY (EGD);  Surgeon: [name], [name], MD;  Location: [facility] ENDOSCOPY;  Service: Gastroenterology;  Laterality: N/A;   HPI:  Patient is a 85 y.o male with history of coronary artery disease, coronary artery bypass grafting, complete heart block, atrial fibrillation aortic stenosis carotid artery stenosis, history of hypothyroidism and diabetes type 2, adhesive capsulitis of the left shoulder, chronic microcytic anemia history of choroidal hemangioma, dyslipidemia, chronic B12 deficiency admitted to ED on 11/19 d/t mechanical fall in garage. Per wife, patient with noted physical and cognitive decline for past 6 months. CT showed infarcts in R frontal, parietal, and BG areas with additional upper GI bleed dx. D/t acute respiratory failure, patient intubated on 11/19 and recently extubated 11/22. SLP to complete BSE to evaluate patient's oropharyngeal swallowing function and ability to resume PO diet.   Assessment / Plan / Recommendation Clinical Impression  Prior to PO trials, SLP completed Cranial Nerve Assessment following oral care with the following results: CN V Trigeminal (motor): decreased on left side, generalized weakness CN V Trigeminal (sensory): DNT CN VII Facial (motor): decreased on left side, generalized weakness CN VII Facial (sensory): DNT CN IX Glossopharyngeal: DNT CN IX and X Glossopharyngeal  and Vagus (motor): no noted deficits CN XI Spinal Accessory (motor): decreased on both sides (L>R), generalized weakness CN XII Hypoglossal (motor): lingual deviation to right side, generalized weakness  Patient presents with moderate oral dysphagia with suspected pharyngeal dysphagia c/b decreased labial seal, decreased lingual ROM (L>R), absent laryngeal movement during volitional salivary swallows resulting in oral residue with puree consistencies, min anterior oral spillage with thin liquids, and occasional immediate and delayed coughing episodes following tsp puree and straw sips of thin liquids. Due to recent extubation, recent CVA, confusion/disorientation, deconditioning, and observed s/s of aspiration on today's evaluation, SLP recommends patient continue NPO status. May have small ice chips and sips of thin liquids after oral care for comfort/hydration provided by trained staff only. SLP also recommends f/u Modified Barium Swallow Study (MBSS) in next 1-3 days to r/o pharyngeal dysphagia. All recommendations and education provided to patient and RN. They both verbalized understanding and agreement. Patient left in bed with call bell in reach and all needs met prior to exiting room.  SLP Visit Diagnosis: Dysphagia, oral phase (R13.11); suspected pharyngeal dysphagia    Aspiration Risk  Moderate aspiration risk    Diet Recommendation NPO;Ice chips PRN after oral care and/or small sips of thin liquids after oral care  Medication Administration:  (Per MD/RN discretion)    Other  Recommendations Oral Care Recommendations: Oral care prior to ice chip/H20   Follow up Recommendations Other (comment) (TBD)      Frequency and Duration min 3x week  2 weeks       Prognosis Prognosis for Safe Diet Advancement: Fair Barriers to Reach Goals: Cognitive deficits;Time post onset;Severity of deficits      Swallow Study   General Date of Onset: 01/26/20 HPI: Patient is a  85 y.o male with  history of coronary artery disease, coronary artery bypass grafting, complete heart block, atrial fibrillation aortic stenosis carotid artery stenosis, history of hypothyroidism and diabetes type 2, adhesive capsulitis of the left shoulder, chronic microcytic anemia history of choroidal hemangioma, dyslipidemia, chronic B12 deficiency admitted to ED on 11/19 d/t mechanical fall in garage. Per wife, patient with noted physical and cognitive decline for past 6 months. CT showed infarcts in R frontal, parietal, and BG areas with additional upper GI bleed dx. D/t acute respiratory failure, patient intubated on 11/19 and recently extubated 11/22. SLP to complete BSE to evaluate patient's oropharyngeal swallowing function and ability to resume PO diet. Type of Study: Bedside Swallow Evaluation Previous Swallow Assessment: None noted/reported Diet Prior to this Study: NPO Temperature Spikes Noted: N/A Respiratory Status: Room air History of Recent Intubation: Yes Length of Intubations (days): 3 days Date extubated: 01/29/20 Behavior/Cognition: Requires cueing;Lethargic/Drowsy;Cooperative Oral Cavity Assessment: Dry;Excessive secretions Oral Care Completed by SLP: Yes Oral Cavity - Dentition: Edentulous Self-Feeding Abilities: Total assist Patient Positioning: Upright in bed Baseline Vocal Quality: Normal Volitional Cough: Congested Volitional Swallow:  (laryngeal movement not noted during OME, however noted given PO trials)    Oral/Motor/Sensory Function Overall Oral Motor/Sensory Function: Generalized oral weakness Facial ROM: Reduced left Facial Symmetry: Abnormal symmetry left Lingual ROM: Reduced left Lingual Symmetry: Abnormal symmetry left Velum: Within Functional Limits Mandible: Within Functional Limits   Ice Chips Ice chips: Within functional limits Presentation: Spoon   Thin Liquid Thin Liquid: Impaired Pharyngeal  Phase Impairments: Cough - Immediate Other Comments: occassional  coughing following thins via straw    Nectar Thick Nectar Thick Liquid: Not tested   Honey Thick Honey Thick Liquid: Not tested   Puree Puree: Impaired Presentation: Spoon Oral Phase Functional Implications: Oral residue Pharyngeal Phase Impairments: Cough - Immediate   Solid     Solid: Not tested     Loni Beckwith, M.S. CCC-SLP Speech-Language Pathologist  Loni Beckwith 01/30/2020,9:41 AM

## 2020-01-30 NOTE — Evaluation (Signed)
Occupational Therapy Evaluation Patient Details Name: Eric Kennedy MRN: [id number] DOB: [birth year] Today's Date: 01/30/2020    History of Present Illness Patient is an 85 y.o. male presenting to the emergency room after being found down by patient's wife.  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  Patient has left dense hemiparesis.  Patient intubated and now extubated on 11/22. Acute hemorrhagic shock with severe anemia with hemoglobin of 4.7 initially. Patient has a known history of carotid stenosis, diabetes mellitus type 2, chronic anemia, complete heart block, HTN, atrial fibrillation, s/p CABG.    Clinical Impression   Pt was seen for OT evaluation this date. Prior to hospital admission, pt reports being INDEP with self care and fxl mobility, pt is somewhat poor historian so unsure of efficacy. Pt does endorse living with a spouse. Currently pt demonstrates impairments as described below (See OT problem list) which functionally limit his ability to perform ADL/self-care tasks. Pt currently requires MIN/MOD A for bed level UB ADLs for which he is currently only able to actively utilize R UE d/t L UE weakness/hypotonicity. Pt requires MAX to TOTAL A For LB ADLs at this time.  Pt politely declines to perform bed mobility with OT at time of assessment citing fatigue. Anticipate that pt would benefit from skilled OT services to address noted impairments and functional limitations (see below for any additional details) in order to maximize safety and independence while minimizing falls risk and caregiver burden. Upon hospital discharge, recommend STR to maximize pt safety and return to PLOF.     Follow Up Recommendations  SNF    Equipment Recommendations  Other (comment) (defer to next level of care)    Recommendations for Other Services       Precautions / Restrictions Precautions Precautions: Fall Restrictions Weight Bearing Restrictions: No      Mobility  Bed Mobility               General bed mobility comments: NT, TOTAL A per PT note    Transfers                 General transfer comment: NT    Balance       Sitting balance - Comments: NT, zero per PT note                                   ADL either performed or assessed with clinical judgement   ADL                                         General ADL Comments: MOD A for UB ADLs with cues to turn head to attend as pt demos R visual gaze preferance. MAX to TOTAL A for LB ADLs at bed level     Vision Patient Visual Report: Other (comment) (R visual gaze preferance/L neglect) Additional Comments: difficult to formally assess d/t higher level commands to follow, but it's notable that pt demos decreased attn to L side     Perception     Praxis      Pertinent Vitals/Pain Pain Assessment: Faces Faces Pain Scale: Hurts little more Pain Location: patient reports no pain, however does grimace with left leg ROM  Pain Descriptors / Indicators: Grimacing Pain Intervention(s): Monitored during session     Hand  Dominance Right   Extremity/Trunk Assessment Upper Extremity Assessment Upper Extremity Assessment: RUE deficits/detail;LUE deficits/detail RUE Deficits / Details: grossly weak, ROM WFL, MMT grossly 4-/5 LUE Deficits / Details: no active movement noted.  LUE Sensation: decreased light touch (distally) LUE Coordination: decreased gross motor;decreased fine motor   Lower Extremity Assessment Lower Extremity Assessment: Defer to PT evaluation       Communication Communication Communication: Other (comment) (pt answering in 2-3 word phrases, mostly just yes/no)   Cognition Arousal/Alertness: Lethargic Behavior During Therapy: Flat affect Overall Cognitive Status: Impaired/Different from baseline Area of Impairment: Following commands;Safety/judgement                       Following Commands: Follows one step  commands with increased time Safety/Judgement: Decreased awareness of deficits;Decreased awareness of safety     General Comments: patient is able to follow single step commands with extra time.    General Comments       Exercises Other Exercises Other Exercises: OT engages pt in MIN/MOD A bed level with HOB elevated to ~50 degrees, oral care and self-feeding (with ice chips as this is all that's permitted at this time) with his R hand with MIN/MOD verbal/tactile cues to attend to cup/reach out and find cup in space.   Shoulder Instructions      Home Living Family/patient expects to be discharged to:: Private residence Living Arrangements: Spouse/significant other Available Help at Discharge: Family                             Additional Comments: patient is a poor historian and unable to provide further information at this time       Prior Functioning/Environment Level of Independence: Independent                 OT Problem List: Decreased strength;Decreased range of motion;Decreased activity tolerance;Impaired balance (sitting and/or standing);Impaired vision/perception;Decreased coordination;Decreased cognition;Decreased knowledge of use of DME or AE;Impaired sensation;Impaired tone;Impaired UE functional use;Increased edema      OT Treatment/Interventions: Self-care/ADL training;DME and/or AE instruction;Therapeutic activities;Balance training;Therapeutic exercise;Neuromuscular education;Patient/family education    OT Goals(Current goals can be found in the care plan section) Acute Rehab OT Goals Patient Stated Goal: none stated OT Goal Formulation: Patient unable to participate in goal setting Time For Goal Achievement: 02/13/20 Potential to Achieve Goals: Fair ADL Goals Pt Will Perform Eating: with supervision;sitting (supported sitting in chair or bed) Pt Will Perform Grooming: with min guard assist;with min assist;sitting (supported sitting) Pt Will  Perform Upper Body Dressing: with min assist;sitting (supported sitting) Additional ADL Goal #1: Pt will tolerate further mobilization with OT to allow for increased OT POC development.  OT Frequency: Min 1X/week   Barriers to D/C:            Co-evaluation              AM-PAC OT "6 Clicks" Daily Activity     Outcome Measure Help from another person eating meals?: A Lot Help from another person taking care of personal grooming?: A Lot Help from another person toileting, which includes using toliet, bedpan, or urinal?: Total Help from another person bathing (including washing, rinsing, drying)?: Total Help from another person to put on and taking off regular upper body clothing?: Total Help from another person to put on and taking off regular lower body clothing?: Total 6 Click Score: 8   End of Session    Activity  Tolerance: Patient limited by fatigue Patient left: in bed;with call bell/phone within reach;with bed alarm set  OT Visit Diagnosis: Other abnormalities of gait and mobility (R26.89);Other symptoms and signs involving the nervous system (R29.898);Other symptoms and signs involving cognitive function;Hemiplegia and hemiparesis Hemiplegia - Right/Left: Left Hemiplegia - caused by: Cerebral infarction                Time: 8688-5207 OT Time Calculation (min): 41 min Charges:  OT General Charges $OT Visit: 1 Visit OT Evaluation $OT Eval Moderate Complexity: 1 Mod OT Treatments $Self Care/Home Management : 23-37 mins  Gerrianne Scale, MS, OTR/L ascom [phone] 01/30/20, 5:01 PM

## 2020-01-30 NOTE — Evaluation (Signed)
Physical Therapy Evaluation Patient Details Name: Eric Kennedy MRN: [id number] DOB: 03/16/1934 Today's Date: 01/30/2020   History of Present Illness  Patient is an 85 y.o. male presenting to the emergency room after being found down by patient's wife.  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  Patient has left dense hemiparesis.  Patient intubated and now extubated on 11/22. Acute hemorrhagic shock with severe anemia with hemoglobin of 4.7 initially. Patient has a known history of carotid stenosis, diabetes mellitus type 2, chronic anemia, complete heart block, HTN, atrial fibrillation, s/p CABG.   Clinical Impression  PT evaluation completed. Patient has right gaze preference and left side inattention. Patient is following some basic one step commands with extra time. Patient has extensive left side hemiparesis. Trace movement noted in LLE with hypertonicity. No active movement noted in LUE and no reaction to noxious stimuli. Patient required total assistance for rolling and supine to and from short sitting. Total assistance progressing to Max A for sitting balance with posterior lean and decreased righting reactions noted. Recommend PT to address functional limitations listed below and to maximize independence. Patient will need SNF placement at discharge.     Follow Up Recommendations SNF    Equipment Recommendations  None recommended by PT (to be determined at next level of care )    Recommendations for Other Services       Precautions / Restrictions Precautions Precautions: Fall Restrictions Weight Bearing Restrictions: No      Mobility  Bed Mobility Overal bed mobility: Needs Assistance Bed Mobility: Rolling;Sit to Supine;Supine to Sit Rolling: Total assist   Supine to sit: Total assist Sit to supine: Total assist   General bed mobility comments: verbal cues for sequencing and technique. assistance for trunk and BLE support     Transfers                  General transfer comment: not assessed due to poor sitting tolerance   Ambulation/Gait                Stairs            Wheelchair Mobility    Modified Rankin (Stroke Patients Only)       Balance Overall balance assessment: Needs assistance Sitting-balance support: Feet unsupported;No upper extremity supported Sitting balance-Leahy Scale: Zero Sitting balance - Comments: patient required total assistance initially progressing to Max A with increased sitting time. posterior lean with loss of balance in all directions  Postural control: Posterior lean (decreased righting reactions in all directions )                                   Pertinent Vitals/Pain Pain Assessment: No/denies pain Pain Score: 5  Pain Location: patient reports no pain, however does grimace with left leg ROM  Pain Descriptors / Indicators: Grimacing Pain Intervention(s): Monitored during session    Home Living Family/patient expects to be discharged to:: Private residence Living Arrangements: Spouse/significant other Available Help at Discharge: Family             Additional Comments: patient is a poor historian and unable to provide further information at this time     Prior Function Level of Independence: Independent               Hand Dominance   Dominant Hand: Right    Extremity/Trunk Assessment   Upper Extremity Assessment Upper Extremity  Assessment: Defer to OT evaluation;LUE deficits/detail LUE Deficits / Details: no active movement noted.  LUE Sensation: decreased light touch (distally ) LUE Coordination: decreased gross motor;decreased fine motor    Lower Extremity Assessment Lower Extremity Assessment: RLE deficits/detail;LLE deficits/detail RLE Deficits / Details: unable to follow commands for formal MMT. patient able to SLR independently, active ankle and hip movement noted  RLE Sensation: WNL LLE Deficits / Details:  hypertonicity with PROM. no active ankle movement noted. patient is able to activate trace hip and knee movement to command. spontaneous movement noted at times.  LLE Sensation: decreased proprioception;decreased light touch LLE Coordination: decreased gross motor;decreased fine motor       Communication   Communication: Other (comment) (patient answering 2-3 word sentences, mostly yes/no )  Cognition Arousal/Alertness: Lethargic Behavior During Therapy: Flat affect Overall Cognitive Status: Impaired/Different from baseline Area of Impairment: Following commands;Safety/judgement                       Following Commands: Follows one step commands with increased time Safety/Judgement: Decreased awareness of deficits;Decreased awareness of safety     General Comments: patient is able to follow single step commands with extra time.       General Comments      Exercises General Exercises - Lower Extremity Ankle Circles/Pumps: PROM;Strengthening;Left;10 reps;Supine Heel Slides: AAROM;Strengthening;Left;10 reps;Supine;PROM Hip ABduction/ADduction: AAROM;Strengthening;10 reps;Supine;PROM Other Exercises Other Exercises: A.AROM- PROM with him and ankle movement. facilitation provided for active movement    Assessment/Plan    PT Assessment Patient needs continued PT services  PT Problem List Decreased strength;Decreased range of motion;Decreased activity tolerance;Decreased balance;Decreased mobility;Decreased coordination;Decreased cognition;Decreased knowledge of use of DME;Decreased safety awareness;Impaired tone;Impaired sensation;Decreased knowledge of precautions       PT Treatment Interventions DME instruction;Gait training;Functional mobility training;Therapeutic activities;Therapeutic exercise;Balance training;Neuromuscular re-education;Patient/family education    PT Goals (Current goals can be found in the Care Plan section)  Acute Rehab PT Goals PT Goal  Formulation: Patient unable to participate in goal setting Time For Goal Achievement: 02/13/20 Potential to Achieve Goals: Fair    Frequency Min 2X/week   Barriers to discharge        Co-evaluation               AM-PAC PT "6 Clicks" Mobility  Outcome Measure Help needed turning from your back to your side while in a flat bed without using bedrails?: Total Help needed moving from lying on your back to sitting on the side of a flat bed without using bedrails?: Total Help needed moving to and from a bed to a chair (including a wheelchair)?: Total Help needed standing up from a chair using your arms (e.g., wheelchair or bedside chair)?: Total Help needed to walk in hospital room?: Total Help needed climbing 3-5 steps with a railing? : Total 6 Click Score: 6    End of Session   Activity Tolerance: Patient limited by fatigue Patient left: in bed;with bed alarm set;with call bell/phone within reach Nurse Communication: Mobility status PT Visit Diagnosis: Other abnormalities of gait and mobility (R26.89);Muscle weakness (generalized) (M62.81);Hemiplegia and hemiparesis;Difficulty in walking, not elsewhere classified (R26.2) Hemiplegia - Right/Left: Left Hemiplegia - dominant/non-dominant: Non-dominant Hemiplegia - caused by: Cerebral infarction    Time: 8527-7824 PT Time Calculation (min) (ACUTE ONLY): 27 min   Charges:   PT Evaluation $PT Eval High Complexity: 1 High PT Treatments $Therapeutic Activity: 8-22 mins        Minna Merritts, PT, MPT   Percell Locus  01/30/2020, 11:51 AM

## 2020-01-30 NOTE — Progress Notes (Signed)
Per MD, leave urinary cath in place for accurate I/O monitoring at this time.

## 2020-01-30 NOTE — Progress Notes (Signed)
Neurology Progress Note   Subjective: - s/p extubation yesterday.    Exam: Vitals:   01/30/20 0500 01/30/20 0800  BP: (!) 135/52 (!) 129/53  Pulse: 82 93  Resp: 14 14  Temp: 98.2 F (36.8 C) 98.1 F (36.7 C)  SpO2: 97% 97%   Gen: In bed, comfortable  Resp: non-labored breathing, comfortable no vent Cardiac: Perfusing extremities well, color much improved  Abd: soft, nt  Neuro: MS: Awakens and follows some simple commands (thumbs up, but does not show 2 fingers, protrudes tongue to command) CN: Persistent right gaze preference, left field cut. PERRL 2-> 1 mm. Tongue midline  Motor: Less briskly moving RUE and RLE, through very tremulous with any movement. Continues to have increased tone of the LUE. 3x flexion of the LLE, no movement of the LUE to noxious stim Sensory: Winces to pain in the LUE briskly. LLE he nods that he can feel my maximal noxious stim but does not wince. Reacts readily to touch on the right arm and leg.  DTR: Increased on the left compared to the right   Pertinent Labs: - Creatinine uptrending 11/21 to 2.33 - Hgb remains stable at ~8   Lab Results  Component Value Date   HGBA1C 5.6 01/27/2020   Lab Results  Component Value Date   CHOL 47 01/27/2020   HDL 28 (L) 01/27/2020   LDLCALC 8 01/27/2020   TRIG 54 01/27/2020   CHOLHDL 1.7 01/27/2020    ECHO EF 50-55%, moderately dilated LA, mild MR and MS, mod AS  Impression: This is an 85 y.o. male who continues to have symptoms concerning for a right MCA syndrome. Most likely hypoperfusion infarct secondary to anemia/hypotension given symptoms have not improved with stabilization of BP and blood counts; imaging today does not show occlusion of culprit carotid though atheroembolic or cardioembolic etiologies remain possible. Will not be able to definitely determine etiology with data available at the the time of the event, unfortunately, though this does not substantially change management at this time. At  this time, he is approaching peak swelling window (3-5 days post event, which ends 11/24 afternoon), and therefore continues to merit close monitoring.  Neurology is additionally asked to comment on prognosis.  Given his left MCA territory stroke and based on serial examinations, and I am concerned he may not regain much movement in the left arm and leg.     Recommendations: [place] repat 11/20 expected evolution of infarct.  Acute ischemic strokes in the setting of GI bleed and known carotid stenosis  Carotid dopplers completed: Right ICA stenosis estimated at 50-69%; Left ICA stenosis estimated greater than 70%, chronic L-vert occlusion redemonstrated Currently on ASA 81mg  and Plavix 75 daily  Will need aggressive pt/ot DNR Will likely need placement Call if any questions

## 2020-01-30 NOTE — Progress Notes (Addendum)
PROGRESS NOTE    Eric Kennedy  PZW:[id number] DOB: 03-30-1934 DOA: 01/26/2020 PCP: [name], MD    Brief Narrative:  Eric Kennedy is an 85 y.o. male presented to the emergency room after being found down by patient's wife.  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  Patient has left dense hemiparesis.  Patient also was noted to have severe anemia with hemoglobin of 4.7. EGD was performed on 11/21, showed gastritis and and gastric ulcer. Patient is placed PPI twice a day. Patient also developed hypoxemic respite failure, intubated for airway protection. Extubated 11/22. Cardiogram performed on 1120, showed severe aortic stenosis and carotid stenosis.   Assessment & Plan:   Active Problems:   Upper GI bleed  #1. Acute hypoxemic respiratory failure. Secondary to acute stroke. Condition had improved. Patient was extubated on 11/20.  #2. Acute blood loss anemia. Gastric ulcer and gastritis. Hypovolemic shock. Hemoglobin has been stable now. No additional bleeding. Continue twice a day PPI. Patient blood pressure has been stable for the last few days.  3. Acute right MCA stroke. Patient is followed by neurology. Continue aspirin Plavix. Patient has been eval by speech therapy, pending modified barium swallow on 11/24. Patient currently still NPO. Continue IV fluids.  4. Hyponatremia and hypokalemia. IV fluid changed to D5 water with added potassium.  5. Acute renal failure on chronic kidney disease stage IIIa. Patient creatinine level 1.4 at 01/2019. Continue IV fluids.  6. Severe aortic stenosis. Follow.  7. Non-STEMI. Secondary to acute stroke.  8. Paroxysmal atrial fibrillation. Patient currently in sinus rhythm. No anticoagulation due to recent GI bleed. May consider anticoagulation when GI condition more stable.     DVT prophylaxis: Heparin Code Status: DNR Family Communication: None Disposition Plan:  .   Status is:  Inpatient  Remains inpatient appropriate because:Inpatient level of care appropriate due to severity of illness   Dispo: The patient is from: Home              Anticipated d/c is to: SNF              Anticipated d/c date is: 3 days              Patient currently is not medically stable to d/c.        I/O last 3 completed shifts: In: 3551.1 [I.V.:2951.1; IV UMPNTIRWE:315] Out: 75 [Urine:3510] Total I/O In: 245 [I.V.:245] Out: 37 [Urine:215]     Consultants:   Neurology  Procedures: EGD  Antimicrobials: None  Subjective: Patient is aphasic, significant weakness to left leg and left arm. Still NPO. Does not seem to have any abdominal pain or nausea vomiting. No fever or chills. No short of breath or hypoxia, weak cough.   Objective: Vitals:   01/30/20 0900 01/30/20 1000 01/30/20 1100 01/30/20 1200  BP: (!) 131/49 (!) 140/48 (!) 124/42   Pulse: 89 81 78 94  Resp: _0 Temp: 98.6 F (37 C) 98.4 F (36.9 C) 98.2 F (36.8 C)   TempSrc:      SpO2: 98% 99% 95% 99%  Weight:      Height:        Intake/Output Summary (Last 24 hours) at 01/30/2020 1213 Last data filed at 01/30/2020 1200 Gross per 24 hour  Intake 1567.79 ml  Output 2415 ml  Net -847.21 ml   Filed Weights   01/27/20 0500 01/28/20 0500 01/29/20 0500  Weight: 72.9 kg 74.8 kg 80.9 kg  Examination:  General exam: Appears calm and comfortable  Respiratory system: Clear to auscultation. Respiratory effort normal. Cardiovascular system: S1 & S2 heard, RRR. No JVD, murmurs, rubs, gallops or clicks. No pedal edema. Gastrointestinal system: Abdomen is nondistended, soft and nontender. No organomegaly or masses felt. Normal bowel sounds heard. Central nervous system: Alert and confuse, aphasic. Left leg weakness. Left arm weakness.  Extremities: Symmetric  Skin: No rashes, lesions or ulcers     Data Reviewed: I have personally reviewed following labs and imaging  studies  CBC: Recent Labs  Lab 01/26/20 1532 01/26/20 2030 01/27/20 0502 01/27/20 0836 01/27/20 1544 01/27/20 2038 01/28/20 0552 01/29/20 0246 01/30/20 0426  WBC 10.9*  --  9.6  --   --   --  8.8 10.2 7.3  NEUTROABS 9.3*  --   --   --   --   --  7.4  --  6.1  HGB 4.7*   < > 8.5*   < > 8.6* 8.6* 8.6* 8.5* 9.8*  HCT 20.0*   < > 27.6*   < > 28.5* 29.1* 29.4* 30.0* 33.5*  MCV 74.3*  --  74.8*  --   --   --  78.2* 81.3 78.8*  PLT 286  --  153  --   --   --  173 176 182   < > = values in this interval not displayed.   Basic Metabolic Panel: Recent Labs  Lab 01/26/20 1532 01/27/20 0502 01/28/20 0552 01/29/20 0246 01/30/20 0426  NA 140 141 142 145 148*  K 4.6 4.1 4.2 4.1 3.5  CL 109 109 114* 116* 118*  CO2 11* 21* 20* 16* 17*  GLUCOSE 136* 93 83 97 102*  BUN 19 25* 33* 42* 36*  CREATININE 2.01* 2.14* 2.33* 2.64* 2.29*  CALCIUM 9.7 9.1 8.8* 8.9 9.2  MG 2.2 2.5*  --   --  2.3  PHOS  --  4.9*  --   --  3.1   GFR: Estimated Creatinine Clearance: 23.6 mL/min (A) (by C-G formula based on SCr of 2.29 mg/dL (H)). Liver Function Tests: Recent Labs  Lab 01/26/20 1532  AST 57*  ALT 19  ALKPHOS 81  BILITOT 1.9*  PROT 6.2*  ALBUMIN 3.4*   No results for input(s): LIPASE, AMYLASE in the last 168 hours. No results for input(s): AMMONIA in the last 168 hours. Coagulation Profile: Recent Labs  Lab 01/26/20 1532 01/27/20 0502  INR 1.5* 1.4*   Cardiac Enzymes: No results for input(s): CKTOTAL, CKMB, CKMBINDEX, TROPONINI in the last 168 hours. BNP (last 3 results) No results for input(s): PROBNP in the last 8760 hours. HbA1C: Recent Labs    01/27/20 1544  HGBA1C 5.6   CBG: Recent Labs  Lab 01/26/20 2053  GLUCAP 118*   Lipid Profile: Recent Labs    01/27/20 1544  CHOL 47  HDL 28*  LDLCALC 8  TRIG 54  CHOLHDL 1.7   Thyroid Function Tests: No results for input(s): TSH, T4TOTAL, FREET4, T3FREE, THYROIDAB in the last 72 hours. Anemia Panel: No results for  input(s): VITAMINB12, FOLATE, FERRITIN, TIBC, IRON, RETICCTPCT in the last 72 hours. Sepsis Labs: No results for input(s): PROCALCITON, LATICACIDVEN in the last 168 hours.  Recent Results (from the past 240 hour(s))  Resp Panel by RT-PCR (Flu A&B, Covid) Nasopharyngeal Swab     Status: None   Collection Time: 01/26/20  6:29 PM   Specimen: Nasopharyngeal Swab; Nasopharyngeal(NP) swabs in vial transport medium  Result Value Ref Range Status  SARS Coronavirus 2 by RT PCR NEGATIVE NEGATIVE Final    Comment: (NOTE) SARS-CoV-2 target nucleic acids are NOT DETECTED.  The SARS-CoV-2 RNA is generally detectable in upper respiratory specimens during the acute phase of infection. The lowest concentration of SARS-CoV-2 viral copies this assay can detect is 138 copies/mL. A negative result does not preclude SARS-Cov-2 infection and should not be used as the sole basis for treatment or other patient management decisions. A negative result may occur with  improper specimen collection/handling, submission of specimen other than nasopharyngeal swab, presence of viral mutation(s) within the areas targeted by this assay, and inadequate number of viral copies(<138 copies/mL). A negative result must be combined with clinical observations, patient history, and epidemiological information. The expected result is Negative.  Fact Sheet for Patients:  EntrepreneurPulse.com.au  Fact Sheet for Healthcare Providers:  IncredibleEmployment.be  This test is no t yet approved or cleared by the [place] FDA and  has been authorized for detection and/or diagnosis of SARS-CoV-2 by FDA under an Emergency Use Authorization (EUA). This EUA will remain  in effect (meaning this test can be used) for the duration of the COVID-19 declaration under Section 564(b)(1) of the Act, 21 U.S.C.section 360bbb-3(b)(1), unless the authorization is terminated  or revoked sooner.        Influenza A by PCR NEGATIVE NEGATIVE Final   Influenza B by PCR NEGATIVE NEGATIVE Final    Comment: (NOTE) The Xpert Xpress SARS-CoV-2/FLU/RSV plus assay is intended as an aid in the diagnosis of influenza from Nasopharyngeal swab specimens and should not be used as a sole basis for treatment. Nasal washings and aspirates are unacceptable for Xpert Xpress SARS-CoV-2/FLU/RSV testing.  Fact Sheet for Patients: EntrepreneurPulse.com.au  Fact Sheet for Healthcare Providers: IncredibleEmployment.be  This test is not yet approved or cleared by the [place] FDA and has been authorized for detection and/or diagnosis of SARS-CoV-2 by FDA under an Emergency Use Authorization (EUA). This EUA will remain in effect (meaning this test can be used) for the duration of the COVID-19 declaration under Section 564(b)(1) of the Act, 21 U.S.C. section 360bbb-3(b)(1), unless the authorization is terminated or revoked.  Performed at [facility], [address] [place], [place] [address]   Culture, blood (routine x 2)     Status: None (Preliminary result)   Collection Time: 01/26/20  6:57 PM   Specimen: BLOOD  Result Value Ref Range Status   Specimen Description BLOOD LEFT ANTECUBITAL  Final   Special Requests   Final    BOTTLES DRAWN AEROBIC AND ANAEROBIC Blood Culture adequate volume   Culture   Final    NO GROWTH 4 DAYS Performed at [facility], [address] [place], [place] [address]    Report Status PENDING  Incomplete  Culture, blood (routine x 2)     Status: None (Preliminary result)   Collection Time: 01/26/20  9:10 PM   Specimen: BLOOD  Result Value Ref Range Status   Specimen Description BLOOD BLOOD LEFT HAND  Final   Special Requests   Final    BOTTLES DRAWN AEROBIC AND ANAEROBIC Blood Culture adequate volume   Culture   Final    NO GROWTH 4 DAYS Performed at [facility], [address], [place],  [place] [address]    Report Status PENDING  Incomplete  MRSA PCR Screening     Status: None   Collection Time: 01/26/20  9:14 PM   Specimen: Nasopharyngeal  Result Value Ref Range Status  MRSA by PCR NEGATIVE NEGATIVE Final    Comment:        The GeneXpert MRSA Assay (FDA approved for NASAL specimens only), is one component of a comprehensive MRSA colonization surveillance program. It is not intended to diagnose MRSA infection nor to guide or monitor treatment for MRSA infections. Performed at [facility], [address], [place], [place] [address]          Radiology Studies: No results found.      Scheduled Meds: . aspirin  81 mg Oral Daily  . chlorhexidine gluconate (MEDLINE KIT)  15 mL Mouth Rinse BID  . Chlorhexidine Gluconate Cloth  6 each Topical Daily  . clopidogrel  75 mg Oral Daily  . docusate  100 mg Per Tube BID  . pantoprazole  40 mg Intravenous Q12H  . polyethylene glycol  17 g Per Tube Daily  . sodium chloride flush  10-40 mL Intracatheter Q12H   Continuous Infusions: . dextrose 5 % with KCl 20 mEq / L       LOS: 4 days    Time spent: 35 minutes    Sharen Hones, MD [facility]   To contact the attending provider between 7A-7P or the covering provider during after hours 7P-7A, please log into the web site [email or web address] and access using universal [place] password for that web site. If you do not have the password, please call the hospital operator.  01/30/2020, 12:13 PM

## 2020-01-30 NOTE — Progress Notes (Signed)
[facility] Cardiology    SUBJECTIVE: Currently intubated sedated unable to respond   Vitals:   01/30/20 0300 01/30/20 0400 01/30/20 0500 01/30/20 0800  BP: (!) 143/54 (!) 138/55 (!) 135/52 (!) 129/53  Pulse: 85 82 82 93  Resp: 16 17 14 14   Temp: 98.2 F (36.8 C) 98.1 F (36.7 C) 98.2 F (36.8 C) 98.1 F (36.7 C)  TempSrc: Rectal Rectal Rectal   SpO2: 98% 98% 97% 97%  Weight:      Height:         Intake/Output Summary (Last 24 hours) at 01/30/2020 1101 Last data filed at 01/30/2020 5643 Gross per 24 hour  Intake 1903.09 ml  Output 3015 ml  Net -1111.91 ml      PHYSICAL EXAM  General: Well developed, well nourished, in no acute distress HEENT:  Normocephalic and atramatic Neck:  No JVD.  Lungs: Clear bilaterally to auscultation and percussion. Heart: HRRR . Normal S1 and S2 without gallops or murmurs.  Abdomen: Bowel sounds are positive, abdomen soft and non-tender  Msk:  Back normal, normal gait. Normal strength and tone for age. Extremities: No clubbing, cyanosis or edema.   Neuro: Intubated sedated. Psych: Unable to assess   LABS: Basic Metabolic Panel: Recent Labs    01/29/20 0246 01/30/20 0426  NA 145 148*  K 4.1 3.5  CL 116* 118*  CO2 16* 17*  GLUCOSE 97 102*  BUN 42* 36*  CREATININE 2.64* 2.29*  CALCIUM 8.9 9.2  MG  --  2.3  PHOS  --  3.1   Liver Function Tests: No results for input(s): AST, ALT, ALKPHOS, BILITOT, PROT, ALBUMIN in the last 72 hours. No results for input(s): LIPASE, AMYLASE in the last 72 hours. CBC: Recent Labs    01/28/20 0552 01/28/20 0552 01/29/20 0246 01/30/20 0426  WBC 8.8   < > 10.2 7.3  NEUTROABS 7.4  --   --  6.1  HGB 8.6*   < > 8.5* 9.8*  HCT 29.4*   < > 30.0* 33.5*  MCV 78.2*   < > 81.3 78.8*  PLT 173   < > 176 182   < > = values in this interval not displayed.   Cardiac Enzymes: No results for input(s): CKTOTAL, CKMB, CKMBINDEX, TROPONINI in the last 72 hours. BNP: Invalid input(s): POCBNP D-Dimer: No  results for input(s): DDIMER in the last 72 hours. Hemoglobin A1C: Recent Labs    01/27/20 1544  HGBA1C 5.6   Fasting Lipid Panel: Recent Labs    01/27/20 1544  CHOL 47  HDL 28*  LDLCALC 8  TRIG 54  CHOLHDL 1.7   Thyroid Function Tests: No results for input(s): TSH, T4TOTAL, T3FREE, THYROIDAB in the last 72 hours.  Invalid input(s): FREET3 Anemia Panel: No results for input(s): VITAMINB12, FOLATE, FERRITIN, TIBC, IRON, RETICCTPCT in the last 72 hours.  No results found.   Echo ejection fraction around 50%  TELEMETRY: Paced rhythm controlled:  ASSESSMENT AND PLAN:  Active Problems:   Upper GI bleed Acute CVA Multivessel coronary disease Altered mental status Non-STEMI Valvular heart disease High-grade AV block with pacemaker in place Respiratory failure . Currently intubated sedated We will continue current medical therapy Maintain pacemaker Difficult anticoagulate because of history of GI bleed patient has a non-STEMI Metoprolol to help with rate control and blood pressure Bradycardia should not be a concern because of pacemaker backup Continue respiratory support and proceed with extubation when appropriate At this point very conservative cardiology input Not an acceptable risk cardiac  cath candidate at this stage     Yolonda Kida, MD 01/30/2020 11:01 AM

## 2020-01-31 ENCOUNTER — Inpatient Hospital Stay: Payer: Medicare Other

## 2020-01-31 DIAGNOSIS — J9601 Acute respiratory failure with hypoxia: Secondary | ICD-10-CM | POA: Diagnosis not present

## 2020-01-31 DIAGNOSIS — D62 Acute posthemorrhagic anemia: Secondary | ICD-10-CM | POA: Diagnosis not present

## 2020-01-31 DIAGNOSIS — K922 Gastrointestinal hemorrhage, unspecified: Secondary | ICD-10-CM | POA: Diagnosis not present

## 2020-01-31 DIAGNOSIS — I63511 Cerebral infarction due to unspecified occlusion or stenosis of right middle cerebral artery: Secondary | ICD-10-CM | POA: Diagnosis not present

## 2020-01-31 LAB — BASIC METABOLIC PANEL
Anion gap: 10 (ref 5–15)
BUN: 30 mg/dL — ABNORMAL HIGH (ref 8–23)
CO2: 21 mmol/L — ABNORMAL LOW (ref 22–32)
Calcium: 9.4 mg/dL (ref 8.9–10.3)
Chloride: 117 mmol/L — ABNORMAL HIGH (ref 98–111)
Creatinine, Ser: 1.69 mg/dL — ABNORMAL HIGH (ref 0.61–1.24)
GFR, Estimated: 40 mL/min — ABNORMAL LOW (ref >60)
Glucose, Bld: 125 mg/dL — ABNORMAL HIGH (ref 70–99)
Potassium: 3 mmol/L — ABNORMAL LOW (ref 3.5–5.1)
Sodium: 148 mmol/L — ABNORMAL HIGH (ref 135–145)

## 2020-01-31 LAB — CULTURE, BLOOD (ROUTINE X 2)
Culture: NO GROWTH
Culture: NO GROWTH
Special Requests: ADEQUATE
Special Requests: ADEQUATE

## 2020-01-31 LAB — CBC WITH DIFFERENTIAL/PLATELET
Abs Immature Granulocytes: 0.04 10*3/uL (ref 0.00–0.07)
Basophils Absolute: 0 10*3/uL (ref 0.0–0.1)
Basophils Relative: 0 %
Eosinophils Absolute: 0.1 10*3/uL (ref 0.0–0.5)
Eosinophils Relative: 1 %
HCT: 31.9 % — ABNORMAL LOW (ref 39.0–52.0)
Hemoglobin: 9.4 g/dL — ABNORMAL LOW (ref 13.0–17.0)
Immature Granulocytes: 1 %
Lymphocytes Relative: 8 %
Lymphs Abs: 0.5 10*3/uL — ABNORMAL LOW (ref 0.7–4.0)
MCH: 22.9 pg — ABNORMAL LOW (ref 26.0–34.0)
MCHC: 29.5 g/dL — ABNORMAL LOW (ref 30.0–36.0)
MCV: 77.6 fL — ABNORMAL LOW (ref 80.0–100.0)
Monocytes Absolute: 0.4 10*3/uL (ref 0.1–1.0)
Monocytes Relative: 6 %
Neutro Abs: 6 10*3/uL (ref 1.7–7.7)
Neutrophils Relative %: 84 %
Platelets: 185 10*3/uL (ref 150–400)
RBC: 4.11 MIL/uL — ABNORMAL LOW (ref 4.22–5.81)
RDW: 24.2 % — ABNORMAL HIGH (ref 11.5–15.5)
Smear Review: NORMAL
WBC: 7.1 10*3/uL (ref 4.0–10.5)
nRBC: 0 % (ref 0.0–0.2)

## 2020-01-31 LAB — MAGNESIUM: Magnesium: 2.3 mg/dL (ref 1.7–2.4)

## 2020-01-31 LAB — PHOSPHORUS: Phosphorus: 2 mg/dL — ABNORMAL LOW (ref 2.5–4.6)

## 2020-01-31 MED ORDER — DOCUSATE SODIUM 50 MG/5ML PO LIQD
100.0000 mg | Freq: Two times a day (BID) | ORAL | Status: DC
  Administered 2020-01-31 – 2020-02-07 (×7): 100 mg via ORAL
  Filled 2020-01-31 (×15): qty 10

## 2020-01-31 MED ORDER — POLYETHYLENE GLYCOL 3350 17 G PO PACK
17.0000 g | PACK | Freq: Every day | ORAL | Status: DC
  Administered 2020-02-02 – 2020-02-07 (×5): 17 g via ORAL
  Filled 2020-01-31 (×7): qty 1

## 2020-01-31 MED ORDER — POTASSIUM CHLORIDE CRYS ER 20 MEQ PO TBCR
40.0000 meq | EXTENDED_RELEASE_TABLET | Freq: Once | ORAL | Status: AC
  Administered 2020-01-31: 17:00:00 40 meq via ORAL
  Filled 2020-01-31: qty 2

## 2020-01-31 MED ORDER — POTASSIUM & SODIUM PHOSPHATES 280-160-250 MG PO PACK
2.0000 | PACK | ORAL | Status: AC
  Administered 2020-01-31 (×2): 2 via ORAL
  Filled 2020-01-31 (×2): qty 2

## 2020-01-31 MED ORDER — POLYETHYLENE GLYCOL 3350 17 G PO PACK
17.0000 g | PACK | Freq: Every day | ORAL | Status: DC | PRN
  Administered 2020-02-06: 17 g via ORAL
  Filled 2020-01-31: qty 1

## 2020-01-31 MED ORDER — POTASSIUM CL IN DEXTROSE 5% 20 MEQ/L IV SOLN
20.0000 meq | INTRAVENOUS | Status: DC
  Administered 2020-01-31 – 2020-02-02 (×3): 20 meq via INTRAVENOUS
  Filled 2020-01-31 (×5): qty 1000

## 2020-01-31 NOTE — Progress Notes (Signed)
PROGRESS NOTE   Eric Kennedy  XIP:[id number]    DOB: 1934/04/06    DOA: 01/26/2020  PCP: [name], MD   I have briefly reviewed patients previous medical records in [facility].  Chief Complaint  Patient presents with  . Fall    Brief Narrative:  85 year old male with PMH of CAD, CABG, complete heart block, atrial fibrillation, aortic stenosis, carotid artery stenosis, hypothyroid, DM 2, chronic microcytic anemia, HLD, chronic B12 deficiency, presented to the ED after being found down by his wife.  CT head revealed watershed infarcts in the right MCA/PCA distribution.  He has dense left hemiparesis.  He was noted to have severe anemia with hemoglobin of 4.7.  EGD 11/21 showed gastritis and gastric ulcer, placed on PPI twice daily.  He developed acute hypoxic respiratory failure, intubated for airway protection, extubated 11/22.  Dysphagia 1 diet started after MBS on 11/24.   Assessment & Plan:  Active Problems:   Upper GI bleed   Acute hypoxemic respiratory failure (HCC)   Acute blood loss anemia   Acute right MCA stroke (HCC)   Acute hypoxic respiratory failure: Secondary to acute stroke and inability to protect airway.  Intubated and subsequently extubated 11/20.  Resolved and saturating normally on room air.  Acute blood loss anemia and hemorrhagic shock due to acute upper GI bleed: S/p EGD which showed gastric ulcer and gastritis.  Hemoglobin stable in the 9 g range after transfusions.  Continue twice daily PPI.  No overt bleeding reported.  Acute right MCA stroke: Neurology consultation and follow-up appreciated.  Suspect CVA most likely hypoperfusion infarct secondary to anemia/hypotension.  Imaging did not show occlusion of complete carotid no atheroembolic or cardioembolic etiologies remain possible although indicated that this would not substantially change management at this time. Neurology indicates that they are concerned that he may not regain much  movement in his left arm and leg.  As per neurology, continue aspirin 81 mg daily and Plavix 75 mg daily.  Per therapy recommendations, SNF at discharge, TOC following.  Dysphagia: ST performed MBS and patient initiated on dysphagia 1 diet and thin liquids and they will reassess in 48 hours.  Hypernatremia/hypokalemia We will start patient on IV D5 water along with potassium supplements and will replace IV potassium runs.  Magnesium normal.  Follow BMP in a.m.  Hypophosphatemia Replace and follow.  Acute kidney injury complicating stage IIIa CKD Creatinine 1.4 on 01/19/2019.  Presented with creatinine of 2.01 which peaked to 2.64.  Gradually improving.  Continue IV fluids and follow BMP.  Avoid nephrotoxic's.  Moderate aortic stenosis Noted on 2D echo.  Outpatient follow-up.  NSTEMI Secondary to acute stroke.  Paroxysmal atrial fibrillation Remains in sinus rhythm.  Currently not on anticoagulation candidate due to recent GI bleed.  May consider later when GI situation stable   Body mass index is 25.31 kg/m.       DVT prophylaxis: heparin injection 5,000 Units Start: 01/30/20 1400 SCDs Start: 01/30/20 1225 SCDs Start: 01/26/20 1736     Code Status: DNR Family Communication: None at bedside Disposition:  Status is: Inpatient  Remains inpatient appropriate because:Inpatient level of care appropriate due to severity of illness   Dispo: The patient is from: Home              Anticipated d/c is to: SNF              Anticipated d/c date is: 3 days  Patient currently is not medically stable to d/c.        Consultants:   PCCM-signed off Neurology GI  Procedures:   EGD S/p extubation  Antimicrobials:    Anti-infectives (From admission, onward)   Start     Dose/Rate Route Frequency Ordered Stop   01/26/20 1845  cefTRIAXone (ROCEPHIN) 1 g in sodium chloride 0.9 % 100 mL IVPB  Status:  Discontinued        1 g 200 mL/hr over 30 Minutes Intravenous  Every 24 hours 01/26/20 1831 01/29/20 1016        Subjective:  Patient seen this morning prior to MBS.  Alert and oriented x2.  Denies planes.  No pain reported.  As per RN, no acute issues noted.  Objective:   Vitals:   01/31/20 0500 01/31/20 0610 01/31/20 0729 01/31/20 1100  BP:  (!) 184/161 (!) 145/54 (!) 140/49  Pulse:  94 83 73  Resp:  _0 Temp:  99.1 F (37.3 C) 99.1 F (37.3 C) 98.1 F (36.7 C)  TempSrc:   Axillary Axillary  SpO2:  91% 96% 97%  Weight: 75.5 kg     Height:        General exam: Pleasant elderly male, small built and moderately nourished lying comfortably propped up in bed without distress. Respiratory system: Clear to auscultation. Respiratory effort normal. Cardiovascular system: S1 & S2 heard, RRR. No JVD, murmurs, rubs, gallops or clicks. No pedal edema.  Telemetry personally reviewed: Atrial sensed, V paced rhythm. Gastrointestinal system: Abdomen is nondistended, soft and nontender. No organomegaly or masses felt. Normal bowel sounds heard. Central nervous system: Alert and oriented x2.  Left facial droop.  Mild dysarthria.  No other cranial nerve deficits appreciated Extremities: Grade 5 x 5 power in the right limbs, grade 0 x 5 power in the left upper extremity and grade 2 x 5 power in the left lower extremity.  Mild bruising of the medial aspect of the right elbow.  Also noted diffuse LUE swelling, could be dependent from CVA but will get venous Doppler to rule out DVT. Skin: No rashes, lesions or ulcers Psychiatry: Judgement and insight appear normal. Mood & affect appropriate.     Data Reviewed:   I have personally reviewed following labs and imaging studies   CBC: Recent Labs  Lab 01/28/20 0552 01/28/20 0552 01/29/20 0246 01/30/20 0426 01/31/20 0410  WBC 8.8   < > 10.2 7.3 7.1  NEUTROABS 7.4  --   --  6.1 6.0  HGB 8.6*   < > 8.5* 9.8* 9.4*  HCT 29.4*   < > 30.0* 33.5* 31.9*  MCV 78.2*   < > 81.3 78.8* 77.6*  PLT 173   < > 176  182 185   < > = values in this interval not displayed.    Basic Metabolic Panel: Recent Labs  Lab 01/27/20 0502 01/28/20 0552 01/29/20 0246 01/30/20 0426 01/31/20 0410  NA 141   < > 145 148* 148*  K 4.1   < > 4.1 3.5 3.0*  CL 109   < > 116* 118* 117*  CO2 21*   < > 16* 17* 21*  GLUCOSE 93   < > 97 102* 125*  BUN 25*   < > 42* 36* 30*  CREATININE 2.14*   < > 2.64* 2.29* 1.69*  CALCIUM 9.1   < > 8.9 9.2 9.4  MG 2.5*  --   --  2.3 2.3  PHOS 4.9*  --   --  3.1 2.0*   < > = values in this interval not displayed.    Liver Function Tests: Recent Labs  Lab 01/26/20 1532  AST 57*  ALT 19  ALKPHOS 81  BILITOT 1.9*  PROT 6.2*  ALBUMIN 3.4*    CBG: Recent Labs  Lab 01/26/20 2053  GLUCAP 118*    Microbiology Studies:   Recent Results (from the past 240 hour(s))  Resp Panel by RT-PCR (Flu A&B, Covid) Nasopharyngeal Swab     Status: None   Collection Time: 01/26/20  6:29 PM   Specimen: Nasopharyngeal Swab; Nasopharyngeal(NP) swabs in vial transport medium  Result Value Ref Range Status   SARS Coronavirus 2 by RT PCR NEGATIVE NEGATIVE Final    Comment: (NOTE) SARS-CoV-2 target nucleic acids are NOT DETECTED.  The SARS-CoV-2 RNA is generally detectable in upper respiratory specimens during the acute phase of infection. The lowest concentration of SARS-CoV-2 viral copies this assay can detect is 138 copies/mL. A negative result does not preclude SARS-Cov-2 infection and should not be used as the sole basis for treatment or other patient management decisions. A negative result may occur with  improper specimen collection/handling, submission of specimen other than nasopharyngeal swab, presence of viral mutation(s) within the areas targeted by this assay, and inadequate number of viral copies(<138 copies/mL). A negative result must be combined with clinical observations, patient history, and epidemiological information. The expected result is Negative.  Fact Sheet for  Patients:  EntrepreneurPulse.com.au  Fact Sheet for Healthcare Providers:  IncredibleEmployment.be  This test is no t yet approved or cleared by the [place] FDA and  has been authorized for detection and/or diagnosis of SARS-CoV-2 by FDA under an Emergency Use Authorization (EUA). This EUA will remain  in effect (meaning this test can be used) for the duration of the COVID-19 declaration under Section 564(b)(1) of the Act, 21 U.S.C.section 360bbb-3(b)(1), unless the authorization is terminated  or revoked sooner.       Influenza A by PCR NEGATIVE NEGATIVE Final   Influenza B by PCR NEGATIVE NEGATIVE Final    Comment: (NOTE) The Xpert Xpress SARS-CoV-2/FLU/RSV plus assay is intended as an aid in the diagnosis of influenza from Nasopharyngeal swab specimens and should not be used as a sole basis for treatment. Nasal washings and aspirates are unacceptable for Xpert Xpress SARS-CoV-2/FLU/RSV testing.  Fact Sheet for Patients: EntrepreneurPulse.com.au  Fact Sheet for Healthcare Providers: IncredibleEmployment.be  This test is not yet approved or cleared by the [place] FDA and has been authorized for detection and/or diagnosis of SARS-CoV-2 by FDA under an Emergency Use Authorization (EUA). This EUA will remain in effect (meaning this test can be used) for the duration of the COVID-19 declaration under Section 564(b)(1) of the Act, 21 U.S.C. section 360bbb-3(b)(1), unless the authorization is terminated or revoked.  Performed at [facility], [address] [place], [place] [address]   Culture, blood (routine x 2)     Status: None   Collection Time: 01/26/20  6:57 PM   Specimen: BLOOD  Result Value Ref Range Status   Specimen Description BLOOD LEFT ANTECUBITAL  Final   Special Requests   Final    BOTTLES DRAWN AEROBIC AND ANAEROBIC Blood Culture adequate volume   Culture   Final     NO GROWTH 5 DAYS Performed at [facility], [address], [place], [place] [address]    Report Status 01/31/2020 FINAL  Final  Culture, blood (routine x 2)     Status: None  Collection Time: 01/26/20  9:10 PM   Specimen: BLOOD  Result Value Ref Range Status   Specimen Description BLOOD BLOOD LEFT HAND  Final   Special Requests   Final    BOTTLES DRAWN AEROBIC AND ANAEROBIC Blood Culture adequate volume   Culture   Final    NO GROWTH 5 DAYS Performed at [facility], [address] [place], [place] [address]    Report Status 01/31/2020 FINAL  Final  MRSA PCR Screening     Status: None   Collection Time: 01/26/20  9:14 PM   Specimen: Nasopharyngeal  Result Value Ref Range Status   MRSA by PCR NEGATIVE NEGATIVE Final    Comment:        The GeneXpert MRSA Assay (FDA approved for NASAL specimens only), is one component of a comprehensive MRSA colonization surveillance program. It is not intended to diagnose MRSA infection nor to guide or monitor treatment for MRSA infections. Performed at [facility], [address], [place], [place] [address]      Radiology Studies:  DG Swallowing [facility] Pathology  Result Date: 01/31/2020 Objective Swallowing Evaluation: Type of Study: MBS-Modified Barium Swallow Study  Patient Details Name: Eric Kennedy MRN: [id number] Date of Birth: 12-14-1934 Today's Date: 01/31/2020 Time: SLP Start Time (ACUTE ONLY): 0825 -SLP Stop Time (ACUTE ONLY): 0850 SLP Time Calculation (min) (ACUTE ONLY): 25 min Past Medical History: Past Medical History: Diagnosis Date . Diabetes mellitus without complication ([place])  . Hypertension  Past Surgical History: Past Surgical History: Procedure Laterality Date . CARDIAC SURGERY    triple bypass . ESOPHAGOGASTRODUODENOSCOPY N/A 01/28/2020  Procedure: ESOPHAGOGASTRODUODENOSCOPY (EGD);  Surgeon: [name], [name], MD;  Location: [facility] ENDOSCOPY;  Service: Gastroenterology;  Laterality: N/A;  HPI: Patient is a 85 y.o male with history of coronary artery disease, coronary artery bypass grafting, complete heart block, atrial fibrillation aortic stenosis carotid artery stenosis, history of hypothyroidism and diabetes type 2, adhesive capsulitis of the left shoulder, chronic microcytic anemia history of choroidal hemangioma, dyslipidemia, chronic B12 deficiency admitted to ED on 11/19 d/t mechanical fall in garage. Per wife, patient with noted physical and cognitive decline for past 6 months. CT showed infarcts in R frontal, parietal, and BG areas with additional upper GI bleed dx. D/t acute respiratory failure, patient intubated on 11/19 and recently extubated 11/22. SLP to complete BSE to evaluate patient's oropharyngeal swallowing function and ability to resume PO diet.  Subjective: pt pleasant, not very interaction Assessment / Plan / Recommendation CHL IP CLINICAL IMPRESSIONS 01/31/2020 Clinical Impression Severe oral phase dysphagia and milder pharyngeal phase dysphagia when consuming nectar thick liquids, thin liquids via straw and puree. Pt's oral phase is c/b weak lingual manipulation and reduced posterior propulsion of bolus. This results in delayed oral transit, decreased bolus cohesion, premature spillage of boluses and piecemeal swallowing. Trace oral residue remains post swallow. Pt's pharyngeal phase is c/b swallow initiation at the vallecula with nectar thick liquids and to the level of the pyriform sinuses with thin liquids. Penetration and aspiration were not observed during the study. More advanced solids were not presented d/t severity of oral phase deficits. Recommend initiating a dysphagia 1 diet with thin liquids (straws ok) and medicine crushed in puree. ST to continue following pt.    SLP Visit Diagnosis Dysphagia, oropharyngeal phase (R13.12) Attention and concentration deficit following -- Frontal lobe and executive function deficit following -- Impact on safety and function Mild  aspiration risk   CHL IP TREATMENT RECOMMENDATION 01/31/2020 Treatment Recommendations Therapy  as outlined in treatment plan below   Prognosis 01/31/2020 Prognosis for Safe Diet Advancement Fair Barriers to Reach Goals Cognitive deficits;Time post onset;Severity of deficits Barriers/Prognosis Comment -- CHL IP DIET RECOMMENDATION 01/31/2020 SLP Diet Recommendations Dysphagia 1 (Puree) solids;Thin liquid Liquid Administration via Straw;Cup Medication Administration Crushed with puree Compensations Minimize environmental distractions;Slow rate;Small sips/bites Postural Changes Seated upright at 90 degrees   CHL IP OTHER RECOMMENDATIONS 01/31/2020 Recommended Consults -- Oral Care Recommendations Oral care BID Other Recommendations --   CHL IP FOLLOW UP RECOMMENDATIONS 01/31/2020 Follow up Recommendations (No Data)   CHL IP FREQUENCY AND DURATION 01/31/2020 Speech Therapy Frequency (ACUTE ONLY) min 3x week Treatment Duration 2 weeks      CHL IP ORAL PHASE 01/31/2020 Oral Phase Impaired Oral - Pudding Teaspoon -- Oral - Pudding Cup -- Oral - Honey Teaspoon -- Oral - Honey Cup -- Oral - Nectar Teaspoon Weak lingual manipulation;Incomplete tongue to palate contact;Reduced posterior propulsion;Holding of bolus;Piecemeal swallowing;Delayed oral transit;Decreased bolus cohesion;Premature spillage;Lingual/palatal residue Oral - Nectar Cup Weak lingual manipulation;Incomplete tongue to palate contact;Reduced posterior propulsion;Holding of bolus;Lingual/palatal residue;Piecemeal swallowing;Delayed oral transit;Decreased bolus cohesion;Premature spillage Oral - Nectar Straw -- Oral - Thin Teaspoon Weak lingual manipulation;Incomplete tongue to palate contact;Reduced posterior propulsion;Holding of bolus;Lingual/palatal residue;Piecemeal swallowing;Delayed oral transit;Decreased bolus cohesion;Premature spillage Oral - Thin Cup -- Oral - Thin Straw Weak lingual manipulation;Incomplete tongue to palate contact;Reduced posterior  propulsion;Holding of bolus;Piecemeal swallowing;Delayed oral transit;Decreased bolus cohesion;Premature spillage;Lingual/palatal residue Oral - Puree Weak lingual manipulation;Incomplete tongue to palate contact;Reduced posterior propulsion;Holding of bolus;Lingual/palatal residue;Piecemeal swallowing;Delayed oral transit;Decreased bolus cohesion;Premature spillage Oral - Mech Soft -- Oral - Regular -- Oral - Multi-Consistency -- Oral - Pill -- Oral Phase - Comment severe impairments  CHL IP PHARYNGEAL PHASE 01/31/2020 Pharyngeal Phase Impaired Pharyngeal- Pudding Teaspoon -- Pharyngeal -- Pharyngeal- Pudding Cup -- Pharyngeal -- Pharyngeal- Honey Teaspoon -- Pharyngeal -- Pharyngeal- Honey Cup -- Pharyngeal -- Pharyngeal- Nectar Teaspoon Delayed swallow initiation-vallecula;Reduced tongue base retraction Pharyngeal -- Pharyngeal- Nectar Cup Delayed swallow initiation-vallecula;Reduced tongue base retraction Pharyngeal -- Pharyngeal- Nectar Straw -- Pharyngeal -- Pharyngeal- Thin Teaspoon Delayed swallow initiation-pyriform sinuses;Reduced tongue base retraction Pharyngeal -- Pharyngeal- Thin Cup -- Pharyngeal -- Pharyngeal- Thin Straw Delayed swallow initiation-pyriform sinuses;Reduced tongue base retraction Pharyngeal -- Pharyngeal- Puree Delayed swallow initiation-vallecula;Reduced tongue base retraction Pharyngeal -- Pharyngeal- Mechanical Soft -- Pharyngeal -- Pharyngeal- Regular -- Pharyngeal -- Pharyngeal- Multi-consistency -- Pharyngeal -- Pharyngeal- Pill -- Pharyngeal -- Pharyngeal Comment pharyngeal phase is slow and is without penetration or aspiration  CHL IP CERVICAL ESOPHAGEAL PHASE 01/31/2020 Cervical Esophageal Phase WFL Pudding Teaspoon -- Pudding Cup -- Honey Teaspoon -- Honey Cup -- Nectar Teaspoon -- Nectar Cup -- Nectar Straw -- Thin Teaspoon -- Thin Cup -- Thin Straw -- Puree -- Mechanical Soft -- Regular -- Multi-consistency -- Pill -- Cervical Esophageal Comment -- Happi B. Rutherford Nail M.S.,  CCC-SLP, [facility] Speech-Language Pathologist Rehabilitation Services Office [phone] Happi Rutherford Nail 01/31/2020, 10:14 AM                Scheduled Meds:   . aspirin  81 mg Oral Daily  . chlorhexidine gluconate (MEDLINE KIT)  15 mL Mouth Rinse BID  . Chlorhexidine Gluconate Cloth  6 each Topical Daily  . clopidogrel  75 mg Oral Daily  . docusate  100 mg Per Tube BID  . heparin  5,000 Units Subcutaneous Q8H  . pantoprazole  40 mg Intravenous Q12H  . polyethylene glycol  17 g Per Tube Daily  . sodium chloride flush  10-40 mL Intracatheter  Q12H    Continuous Infusions:   . dextrose 5 % with KCl 20 mEq / L 20 mEq (01/31/20 0329)     LOS: 5 days     Vernell Leep, MD, [place], [facility]. [facility]    To contact the attending provider between 7A-7P or the covering provider during after hours 7P-7A, please log into the web site [email or web address] and access using universal [place] password for that web site. If you do not have the password, please call the hospital operator.  01/31/2020, 2:57 PM

## 2020-01-31 NOTE — Progress Notes (Signed)
Physical Therapy Treatment Patient Details Name: Eric Kennedy MRN: [id number] DOB: 1935-01-20 Today's Date: 01/31/2020    History of Present Illness Patient is an 85 y.o. male presenting to the emergency room after being found down by patient's wife.  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  Patient has left dense hemiparesis.  Patient intubated and now extubated on 11/22. Acute hemorrhagic shock with severe anemia with hemoglobin of 4.7 initially. Patient has a known history of carotid stenosis, diabetes mellitus type 2, chronic anemia, complete heart block, HTN, atrial fibrillation, s/p CABG.     PT Comments    Pt was supine in bed with Hob elevated 30 degrees upon arriving. Overall pt is lethargic but does wake up. Needs constant tactile cues to stay awake and focused on desired task. He was oriented to self, setting, and reason for being in hospital. Was able to tell therapist he was a [employer] man in the [employer]. does follow simple one step commands with increased time. Severely limited but deficits. Pt demonstrated L side neglect and was unable to perform any AROM with LUE/LLE with max encouragement. He was able to tolerate sitting EOB x ~ 10 minutes however has severe posterior lean and requires constant vcs for safety. Pt did fatigue quickly requesting to lay back down. RN staff aware of pt's limited abilities and safety concerns. Will need extensive PT going forward to return to PLOF.  Recommend SNF at DC to address deficits and assist pt to PLOF.    Follow Up Recommendations  SNF     Equipment Recommendations  None recommended by PT    Recommendations for Other Services       Precautions / Restrictions Precautions Precautions: Fall Restrictions Weight Bearing Restrictions: No    Mobility  Bed Mobility Overal bed mobility: Needs Assistance Bed Mobility: Rolling;Sit to Supine;Supine to Sit Rolling: Total assist   Supine to sit: Total  assist     General bed mobility comments: Pt does assist however due to flaccid LUE/LE required total assist to achieve EOB sitting. Severe posterior lean at first.   Transfers  General transfer comment: unsafe to trial        Balance Overall balance assessment: Needs assistance Sitting-balance support: Feet supported;Bilateral upper extremity supported Sitting balance-Leahy Scale: Poor Sitting balance - Comments: required constant assistance to maintain sitting balance at EOB       Cognition Arousal/Alertness: Lethargic Behavior During Therapy: Flat affect Overall Cognitive Status: Impaired/Different from baseline Area of Impairment: Following commands;Safety/judgement      Following Commands: Follows one step commands with increased time Safety/Judgement: Decreased awareness of deficits;Decreased awareness of safety     General Comments: pt was able to answer all questions and is oriented to hospital and reason for being in hospital. Does require constant vcs to stay awake and partipate             Pertinent Vitals/Pain Pain Assessment: Faces Faces Pain Scale: Hurts a little bit Pain Location: L knee Pain Descriptors / Indicators: Grimacing Pain Intervention(s): Limited activity within patient's tolerance;Monitored during session;Premedicated before session;Ice applied           PT Goals (current goals can now be found in the care plan section) Acute Rehab PT Goals Patient Stated Goal: none stated Progress towards PT goals: Progressing toward goals    Frequency    Min 2X/week      PT Plan Current plan remains appropriate       AM-PAC  PT "6 Clicks" Mobility   Outcome Measure  Help needed turning from your back to your side while in a flat bed without using bedrails?: A Lot Help needed moving from lying on your back to sitting on the side of a flat bed without using bedrails?: Total Help needed moving to and from a bed to a chair (including a  wheelchair)?: Total Help needed standing up from a chair using your arms (e.g., wheelchair or bedside chair)?: Total Help needed to walk in hospital room?: Total Help needed climbing 3-5 steps with a railing? : Total 6 Click Score: 7    End of Session   Activity Tolerance: Patient tolerated treatment well Patient left: in bed;with bed alarm set;with call bell/phone within reach Nurse Communication: Mobility status PT Visit Diagnosis: Other abnormalities of gait and mobility (R26.89);Muscle weakness (generalized) (M62.81);Hemiplegia and hemiparesis;Difficulty in walking, not elsewhere classified (R26.2) Hemiplegia - Right/Left: Left Hemiplegia - dominant/non-dominant: Non-dominant Hemiplegia - caused by: Cerebral infarction     Time: 1010-1030 PT Time Calculation (min) (ACUTE ONLY): 20 min  Charges:  $Therapeutic Activity: 8-22 mins                     Julaine Fusi PTA 01/31/20, 10:52 AM

## 2020-01-31 NOTE — NC FL2 (Signed)
[place] LEVEL OF CARE SCREENING TOOL     IDENTIFICATION  Patient Name: Eric Kennedy Birthdate: 1935-03-06 Sex: male Admission Date (Current Location): 01/26/2020  [facility] and [place] Number:  Engineering geologist and Address:  [facility], [address], [place], [place] [address]      Provider Number: [id number]  Attending Physician Name and Address:  Modena Jansky, MD  Relative Name and Phone Number:  [name] - [phone]    Current Level of Care: Hospital Recommended Level of Care: [place] Prior Approval Number:    Date Approved/Denied:   PASRR Number: [id number]  Discharge Plan: SNF    Current Diagnoses: Patient Active Problem List   Diagnosis Date Noted   Acute hypoxemic respiratory failure ([place]) 01/30/2020   Acute blood loss anemia 01/30/2020   Acute right MCA stroke ([place]) 01/30/2020   Upper GI bleed 01/26/2020   Complete heart block ([place]) 06/16/2018   Healthcare maintenance 06/06/2018   Controlled type 2 diabetes mellitus with complication, without long-term current use of insulin ([place]) 12/02/2017   Hypertension, well controlled 12/02/2017   Chronic anemia 06/01/2017   Hemangioma of choroid 11/19/2016   Vitamin B 12 deficiency 11/26/2015   Pure hypercholesterolemia 08/27/2015   Severe aortic valve stenosis 06/18/2015   Acquired hypothyroidism 02/14/2014   Adhesive capsulitis of left shoulder 08/25/2013   3-vessel CAD 05/16/2013   Hx of CABG 05/16/2013   Carotid stenosis 05/16/2013   Paroxysmal atrial fibrillation (HCC) 05/16/2013    Orientation RESPIRATION BLADDER Height & Weight     Self, Time, Situation, Place  Normal Incontinent, External catheter Weight: 166 lb 7.2 oz (75.5 kg) Height:  '5\' 8"'  (172.7 cm)  BEHAVIORAL SYMPTOMS/MOOD NEUROLOGICAL BOWEL NUTRITION STATUS      Continent Diet (dysphagia 1, thin liquids)  AMBULATORY STATUS COMMUNICATION OF NEEDS Skin    Extensive Assist Verbally Skin abrasions (abraison- elbow- foam)                       Personal Care Assistance Level of Assistance  Bathing, Feeding, Dressing Bathing Assistance: Maximum assistance Feeding assistance: Maximum assistance Dressing Assistance: Maximum assistance     Functional Limitations Info             SPECIAL CARE FACTORS FREQUENCY  PT (By licensed PT), OT (By licensed OT), Speech therapy     PT Frequency: 5 x/week OT Frequency: 5 x/week     Speech Therapy Frequency: 5 x/week      Contractures      Additional Factors Info  Code Status, Allergies Code Status Info: DNR Allergies Info: nka           Current Medications (01/31/2020):  This is the current hospital active medication list Current Facility-Administered Medications  Medication Dose Route Frequency Provider Last Rate Last Admin   aspirin chewable tablet 81 mg  81 mg Oral Daily Bhagat, Srishti L, MD   81 mg at 01/31/20 1054   chlorhexidine gluconate (MEDLINE KIT) (PERIDEX) 0.12 % solution 15 mL  15 mL Mouth Rinse BID Darel Hong D, NP   15 mL at 01/31/20 1055   Chlorhexidine Gluconate Cloth 2 % PADS 6 each  6 each Topical Daily Darel Hong D, NP   6 each at 01/31/20 1056   clopidogrel (PLAVIX) tablet 75 mg  75 mg Oral Daily Bhagat, Srishti L, MD   75 mg at 01/31/20 1054   dextrose 5 % with KCl 20 mEq / L  infusion  20 mEq Intravenous Continuous Sharen Hones, MD 60 mL/hr at 01/31/20 0329 20 mEq at 01/31/20 0329   docusate (COLACE) 50 MG/5ML liquid 100 mg  100 mg Per Tube BID Ottie Glazier, MD   100 mg at 01/31/20 1053   heparin injection 5,000 Units  5,000 Units Subcutaneous Q8H Sharen Hones, MD   5,000 Units at 01/31/20 0507   pantoprazole (PROTONIX) injection 40 mg  40 mg Intravenous Q12H Vladimir Crofts, MD   40 mg at 01/31/20 1053   polyethylene glycol (MIRALAX / GLYCOLAX) packet 17 g  17 g Per Tube Daily PRN Ottie Glazier, MD       polyethylene glycol (MIRALAX /  GLYCOLAX) packet 17 g  17 g Per Tube Daily Ottie Glazier, MD   17 g at 01/31/20 1053   sodium chloride flush (NS) 0.9 % injection 10-40 mL  10-40 mL Intracatheter Q12H Ottie Glazier, MD   10 mL at 01/31/20 1056   sodium chloride flush (NS) 0.9 % injection 10-40 mL  10-40 mL Intracatheter PRN Ottie Glazier, MD         Discharge Medications: Please see discharge summary for a list of discharge medications.  Relevant Imaging Results:  Relevant Lab Results:   Additional Information SS #: [id number] [place], LCSW

## 2020-01-31 NOTE — Care Management Important Message (Signed)
Important Message  Patient Details  Name: Eric Kennedy MRN: [id number] Date of Birth: [birth year]   Medicare Important Message Given:  Other (see comment)  No family in the room with the patient and he was sleeping so I left a copy on the bedside table. I also left message for [name], son [phone]) but have not heard back from him yet to review the Important Message from [facility].   Juliann Pulse A Adalae Baysinger 01/31/2020, 2:09 PM

## 2020-01-31 NOTE — Consult Note (Signed)
PHARMACY CONSULT NOTE - FOLLOW UP  Pharmacy Consult for Electrolyte Monitoring and Replacement   Recent Labs: Potassium (mmol/L)  Date Value  01/31/2020 3.0 (L)   Magnesium (mg/dL)  Date Value  01/31/2020 2.3   Calcium (mg/dL)  Date Value  01/31/2020 9.4   Albumin (g/dL)  Date Value  01/26/2020 3.4 (L)   Phosphorus (mg/dL)  Date Value  01/31/2020 2.0 (L)   Sodium (mmol/L)  Date Value  01/31/2020 148 (H)     Assessment: 85 year old male with PMH of CAD, CABG, complete heart block, atrial fibrillation, aortic stenosis, carotid artery stenosis, hypothyroid, DM 2, chronic microcytic anemia, HLD, chronic B12 deficiency with CVA and mechanical fall.  Pharmacy has been consulted to replenish and monitor electrolytes  Goal of Therapy:  Electrolytes wnl's  Plan:  Mg 2.3 - wnls - no replenishment warranted  K = 3.0, repleted with KCL 40 meq po  Phos = 2.0 - will replete with KPhos packets 2 q 3h x 2   Will recheck Mg, K, and Phos with am labs  [name] ,PharmD Clinical Pharmacist 01/31/2020 3:25 PM

## 2020-01-31 NOTE — Progress Notes (Signed)
Modified Barium Swallow Progress Note  Patient Details  Name: Eric Kennedy MRN: [id number] Date of Birth: 15-Apr-1934  Today's Date: 01/31/2020  Modified Barium Swallow completed.  Full report located under Chart Review in the Imaging Section.  Brief recommendations include the following:  Clinical Impression  Severe oral phase dysphagia and milder pharyngeal phase dysphagia when consuming nectar thick liquids, thin liquids via straw and puree. Pt's oral phase is c/b weak lingual manipulation and reduced posterior propulsion of bolus. This results in delayed oral transit, decreased bolus cohesion, premature spillage of boluses and piecemeal swallowing. Trace oral residue remains post swallow. Pt's pharyngeal phase is c/b swallow initiation at the vallecula with nectar thick liquids and to the level of the pyriform sinuses with thin liquids. Penetration and aspiration were not observed during the study. More advanced solids were not presented d/t severity of oral phase deficits. Recommend initiating a dysphagia 1 diet with thin liquids (straws ok) and medicine crushed in puree. ST to continue following pt.      Swallow Evaluation Recommendations       SLP Diet Recommendations: Dysphagia 1 (Puree) solids;Thin liquid   Liquid Administration via: Straw;Cup   Medication Administration: Crushed with puree   Supervision: Staff to assist with self feeding;Full supervision/cueing for compensatory strategies   Compensations: Minimize environmental distractions;Slow rate;Small sips/bites   Postural Changes: Seated upright at 90 degrees   Oral Care Recommendations: Oral care BID       Tyrica Afzal B. Rutherford Nail, M.S., CCC-SLP, [place] Office [phone]  Neytiri Asche 01/31/2020,10:13 AM

## 2020-01-31 NOTE — TOC Progression Note (Addendum)
Transition of Care (TOC) - Progression Note  ° ° °Patient Details  °Name: Eric Kennedy °MRN: [id number] °Date of Birth: 01/01/1935 ° °Transition of Care (TOC) CM/SW Contact  °Meagan E Hagwood, LCSW °Phone Number: °01/31/2020, 2:27 PM ° °Clinical Narrative:   CSW met with patient, patient's son Mike, and patient's wife Barbara at bedside. Patient not engaged in conversation. Patient lives with his wife, son is in from out of town. Patient was driving himself to appointments prior to this hospitalization. No HH, SNF, or DME history. PCP is [facility]. Pharmacy is [facility]. Explained PT recommendation for SNF rehab and provided list from Medicare.gov. Patient's son reported they are agreeable to SNF rehab, will look over list of options and is ok with CSW starting SNF work up. Patient is COVID vaccinated per chart review.   ° °CSW completing PASRR and SNF work up. ° ° ° °Expected Discharge Plan: Skilled Nursing Facility °Barriers to Discharge: Continued Medical Work up ° °Expected Discharge Plan and Services °Expected Discharge Plan: Skilled Nursing Facility °In-house Referral: Clinical Social Work °  °Post Acute Care Choice: Home Health °Living arrangements for the past 2 months: Single Family Home °                °  °  °  °  °  °  °  °  °  °  ° ° °Social Determinants of Health (SDOH) Interventions °  ° °Readmission Risk Interventions °No flowsheet data found. ° °

## 2020-02-01 ENCOUNTER — Inpatient Hospital Stay: Payer: Medicare Other

## 2020-02-01 DIAGNOSIS — I63511 Cerebral infarction due to unspecified occlusion or stenosis of right middle cerebral artery: Secondary | ICD-10-CM | POA: Diagnosis not present

## 2020-02-01 DIAGNOSIS — D62 Acute posthemorrhagic anemia: Secondary | ICD-10-CM | POA: Diagnosis not present

## 2020-02-01 DIAGNOSIS — K922 Gastrointestinal hemorrhage, unspecified: Secondary | ICD-10-CM | POA: Diagnosis not present

## 2020-02-01 DIAGNOSIS — J9601 Acute respiratory failure with hypoxia: Secondary | ICD-10-CM | POA: Diagnosis not present

## 2020-02-01 LAB — CBC WITH DIFFERENTIAL/PLATELET
Abs Immature Granulocytes: 0.01 10*3/uL (ref 0.00–0.07)
Basophils Absolute: 0 10*3/uL (ref 0.0–0.1)
Basophils Relative: 0 %
Eosinophils Absolute: 0.2 10*3/uL (ref 0.0–0.5)
Eosinophils Relative: 4 %
HCT: 30.8 % — ABNORMAL LOW (ref 39.0–52.0)
Hemoglobin: 9.2 g/dL — ABNORMAL LOW (ref 13.0–17.0)
Immature Granulocytes: 0 %
Lymphocytes Relative: 13 %
Lymphs Abs: 0.7 10*3/uL (ref 0.7–4.0)
MCH: 23.2 pg — ABNORMAL LOW (ref 26.0–34.0)
MCHC: 29.9 g/dL — ABNORMAL LOW (ref 30.0–36.0)
MCV: 77.8 fL — ABNORMAL LOW (ref 80.0–100.0)
Monocytes Absolute: 0.3 10*3/uL (ref 0.1–1.0)
Monocytes Relative: 6 %
Neutro Abs: 4.1 10*3/uL (ref 1.7–7.7)
Neutrophils Relative %: 77 %
Platelets: 164 10*3/uL (ref 150–400)
RBC: 3.96 MIL/uL — ABNORMAL LOW (ref 4.22–5.81)
RDW: 23.9 % — ABNORMAL HIGH (ref 11.5–15.5)
Smear Review: NORMAL
WBC: 5.4 10*3/uL (ref 4.0–10.5)
nRBC: 0 % (ref 0.0–0.2)

## 2020-02-01 LAB — BASIC METABOLIC PANEL
Anion gap: 9 (ref 5–15)
BUN: 23 mg/dL (ref 8–23)
CO2: 21 mmol/L — ABNORMAL LOW (ref 22–32)
Calcium: 9.4 mg/dL (ref 8.9–10.3)
Chloride: 116 mmol/L — ABNORMAL HIGH (ref 98–111)
Creatinine, Ser: 1.46 mg/dL — ABNORMAL HIGH (ref 0.61–1.24)
GFR, Estimated: 47 mL/min — ABNORMAL LOW (ref >60)
Glucose, Bld: 116 mg/dL — ABNORMAL HIGH (ref 70–99)
Potassium: 3 mmol/L — ABNORMAL LOW (ref 3.5–5.1)
Sodium: 146 mmol/L — ABNORMAL HIGH (ref 135–145)

## 2020-02-01 LAB — MAGNESIUM: Magnesium: 2.2 mg/dL (ref 1.7–2.4)

## 2020-02-01 LAB — PHOSPHORUS: Phosphorus: 2.1 mg/dL — ABNORMAL LOW (ref 2.5–4.6)

## 2020-02-01 MED ORDER — APIXABAN 5 MG PO TABS: 5.0000 mg | ORAL_TABLET | Freq: Two times a day (BID) | ORAL | Status: DC

## 2020-02-01 MED ORDER — POTASSIUM CHLORIDE 10 MEQ/100ML IV SOLN
10.0000 meq | INTRAVENOUS | Status: AC
  Administered 2020-02-01 (×4): 10 meq via INTRAVENOUS
  Filled 2020-02-01 (×2): qty 100

## 2020-02-01 MED ORDER — K PHOS MONO-SOD PHOS DI & MONO 155-852-130 MG PO TABS
500.0000 mg | ORAL_TABLET | Freq: Two times a day (BID) | ORAL | Status: DC
  Filled 2020-02-01 (×2): qty 2

## 2020-02-01 MED ORDER — POTASSIUM CHLORIDE CRYS ER 20 MEQ PO TBCR
40.0000 meq | EXTENDED_RELEASE_TABLET | ORAL | Status: DC
  Filled 2020-02-01 (×2): qty 2

## 2020-02-01 MED ORDER — K PHOS MONO-SOD PHOS DI & MONO 155-852-130 MG PO TABS
500.0000 mg | ORAL_TABLET | ORAL | Status: AC
  Administered 2020-02-01 (×2): 500 mg via ORAL
  Filled 2020-02-01 (×2): qty 2

## 2020-02-01 MED ORDER — APIXABAN 5 MG PO TABS
10.0000 mg | ORAL_TABLET | Freq: Two times a day (BID) | ORAL | Status: DC
  Administered 2020-02-01 – 2020-02-07 (×12): 10 mg via ORAL
  Filled 2020-02-01 (×7): qty 2
  Filled 2020-02-01: qty 4
  Filled 2020-02-01 (×5): qty 2

## 2020-02-01 MED ORDER — INFLUENZA VAC A&B SA ADJ QUAD 0.5 ML IM PRSY
0.5000 mL | PREFILLED_SYRINGE | INTRAMUSCULAR | Status: DC | PRN
  Filled 2020-02-01: qty 0.5

## 2020-02-01 NOTE — Consult Note (Signed)
PHARMACY CONSULT NOTE - FOLLOW UP  Pharmacy Consult for Electrolyte Monitoring and Replacement   Recent Labs: Potassium (mmol/L)  Date Value  02/01/2020 3.0 (L)   Magnesium (mg/dL)  Date Value  02/01/2020 2.2   Calcium (mg/dL)  Date Value  02/01/2020 9.4   Albumin (g/dL)  Date Value  01/26/2020 3.4 (L)   Phosphorus (mg/dL)  Date Value  02/01/2020 2.1 (L)   Sodium (mmol/L)  Date Value  02/01/2020 146 (H)     Assessment: 85 year old male with PMH of CAD, CABG, complete heart block, atrial fibrillation, aortic stenosis, carotid artery stenosis, hypothyroid, DM 2, chronic microcytic anemia, HLD, chronic B12 deficiency with CVA and mechanical fall.  Pharmacy has been consulted to replenish and monitor electrolytes  Goal of Therapy:  Electrolytes wnl's  Plan:  KCl 40 mEq x 2 doses + D5 with KCl 20 mEq KPhos 500 mg BID x 2 doses. Spoke with nurse, will try again with PO Kphos. IV phos is on shortage. Pt is capable of taking PO meds.  Will recheck Mg, K, and Phos with am labs  Sherilyn Banker, PharmD Pharmacy Resident  02/01/2020 3:20 PM

## 2020-02-01 NOTE — Progress Notes (Signed)
PROGRESS NOTE   Cheryl Stabenow  EPP:[id number]    DOB: 03-17-1934    DOA: 01/26/2020  PCP: [name], MD   I have briefly reviewed patients previous medical records in [facility].  Chief Complaint  Patient presents with  . Fall    Brief Narrative:  85 year old male with PMH of CAD, CABG, complete heart block, atrial fibrillation, aortic stenosis, carotid artery stenosis, hypothyroid, DM 2, chronic microcytic anemia, HLD, chronic B12 deficiency, presented to the ED after being found down by his wife.  CT head revealed watershed infarcts in the right MCA/PCA distribution.  He has dense left hemiparesis.  He was noted to have severe anemia with hemoglobin of 4.7.  EGD 11/21 showed gastritis and gastric ulcer, placed on PPI twice daily.  He developed acute hypoxic respiratory failure, intubated for airway protection, extubated 11/22.  Dysphagia 1 diet started after MBS on 11/24. Left upper extremity venous Doppler to evaluate for LUE swelling confirmed DVT on 11/25.   Assessment & Plan:  Active Problems:   Upper GI bleed   Acute hypoxemic respiratory failure (HCC)   Acute blood loss anemia   Acute right MCA stroke (HCC)   Acute hypoxic respiratory failure: Secondary to acute stroke and inability to protect airway.  Intubated and subsequently extubated 11/20. Resolved.  Acute blood loss anemia and hemorrhagic shock due to acute upper GI bleed: S/p EGD which showed gastric ulcer without bleeding and gastritis.  Hemoglobin stable in the 9 g range after transfusions.  Continue twice daily PPI.  No overt bleeding reported. Monitor closely while Eliquis initiated.  Acute right MCA stroke: Neurology consultation and follow-up appreciated.  Suspect CVA most likely hypoperfusion infarct secondary to anemia/hypotension.  Imaging did not show occlusion of complete carotid no atheroembolic or cardioembolic etiologies remain possible although they indicated that this would not  substantially change management at this time. Neurology indicates that they are concerned that he may not regain much movement in his left arm and leg. Patient currently on aspirin 81 mg daily and Plavix 75 mg daily. However now that the patient has left upper extremity DVT, I discussed with Neuro Hospitalist on call on 11/25 and he recommended okay with anticoagulation with a DOAC along with aspirin 81 mg daily and stop Plavix. Per therapy recommendations, SNF at discharge, TOC following.   Acute left upper extremity DVT Venous Doppler confirms partially occlusive left upper extremity DVT involving brachial, axillary and subclavian veins along with superficial thrombophlebitis involving basilic vein. As discussed above with neuro hospitalist on call, initiated Eliquis 11/25. Patient will remain on aspirin 81 mg daily for recent acute stroke but stopped Plavix. I discussed in detail with patient's son via phone, updated care and answered all questions, discussed the risks versus benefits of anticoagulation, various anticoagulation options including Coumadin, Xarelto, Eliquis and Pradaxa risk versus benefits of each and adverse effects. He wanted me to start Eliquis. Obviously if patient develops bleeding complications, then will have to stop anticoagulation  Dysphagia: ST performed MBS and patient initiated on dysphagia 1 diet and thin liquids and they will reassess in 48 hours. Patient reportedly had an episode of nonbloody emesis this morning. Also refusing to eat/poor oral intake. I discussed in detail with patient's son regarding patient's poor oral intake and need to address goals of care and he is agreeable to a palliative care consult.  Hypernatremia Serum sodium has slightly improved to 146, continue IV D5 infusion and follow daily BMP.  Hypokalemia: Replace aggressively and  follow. Magnesium normal/two-point  Hypophosphatemia Replace and follow.  Acute kidney injury complicating stage IIIa  CKD Creatinine 1.4 on 01/19/2019.  Presented with creatinine of 2.01 which peaked to 2.64.  Gradually improving.  Continue IV fluids and follow BMP.  Avoid nephrotoxic's.  Moderate aortic stenosis Noted on 2D echo.  Outpatient follow-up.  NSTEMI Secondary to acute stroke.  Paroxysmal atrial fibrillation Remains in sinus rhythm. Was not on anticoagulation candidate due to recent GI bleed. Now starting anticoagulation for DVT, closely monitor for bleeding.  Adult failure to thrive: Palliative care consulted to address goals of care as discussed with patient's son.   Body mass index is 25.68 kg/m.       DVT prophylaxis: heparin injection 5,000 Units Start: 01/30/20 1400 SCDs Start: 01/30/20 1225     Code Status: DNR Family Communication: None at bedside Disposition:  Status is: Inpatient  Remains inpatient appropriate because:Inpatient level of care appropriate due to severity of illness   Dispo: The patient is from: Home              Anticipated d/c is to: SNF              Anticipated d/c date is: 3 days              Patient currently is not medically stable to d/c.        Consultants:   PCCM-signed off Neurology GI  Procedures:   EGD 01/28/2020:  - Z-line irregular, 35 to 37 cm from the incisors. - 1 cm hiatal hernia. - Nasogastric tube trauma present in stomach. - Non-bleeding gastric ulcer with no stigmata of bleeding. - Gastritis. - Normal examined duodenum. - No specimens collected.  S/p extubation  Antimicrobials:    Anti-infectives (From admission, onward)   Start     Dose/Rate Route Frequency Ordered Stop   01/26/20 1845  cefTRIAXone (ROCEPHIN) 1 g in sodium chloride 0.9 % 100 mL IVPB  Status:  Discontinued        1 g 200 mL/hr over 30 Minutes Intravenous Every 24 hours 01/26/20 1831 01/29/20 1016        Subjective:  Patient lethargic this morning. Arousable and oriented to self and place. "What am I eating today?". As per RN,  subsequently had a bout of vomiting and really not taking much.  Objective:   Vitals:   01/31/20 2014 01/31/20 2324 02/01/20 0408 02/01/20 0500  BP: (!) 140/57 (!) 145/69 (!) 148/57   Pulse: 81 75 73   Resp: _0 Temp: 99.3 F (37.4 C) 98.6 F (37 C) 98.6 F (37 C)   TempSrc: Oral     SpO2: 97% 95% 93%   Weight:    76.6 kg  Height:        General exam: Pleasant elderly male, small built and moderately nourished lying comfortably propped up in bed without distress. Respiratory system: Clear to auscultation. Respiratory effort normal. Cardiovascular system: S1 & S2 heard, RRR. No JVD, murmurs, rubs, gallops or clicks. No pedal edema. Telemetry personally reviewed: AV paced rhythm, 12 beat NSVT on 11/25 at 7:55 AM. Gastrointestinal system: Abdomen is nondistended, soft and nontender. No organomegaly or masses felt. Normal bowel sounds heard. Central nervous system: Lethargic but easily arousable and oriented x2.  Left facial droop.  Mild dysarthria.  No other cranial nerve deficits appreciated Extremities: Grade 5 x 5 power in the right limbs, grade 0 x 5 power in the left upper extremity and grade 2 x  5 power in the left lower extremity.  Mild bruising of the medial aspect of the right elbow.  Also noted diffuse LUE swelling-unchanged from yesterday. Skin: No rashes, lesions or ulcers Psychiatry: Judgement and insight impaired. Mood & affect flat.     Data Reviewed:   I have personally reviewed following labs and imaging studies   CBC: Recent Labs  Lab 01/30/20 0426 01/31/20 0410 02/01/20 0405  WBC 7.3 7.1 5.4  NEUTROABS 6.1 6.0 4.1  HGB 9.8* 9.4* 9.2*  HCT 33.5* 31.9* 30.8*  MCV 78.8* 77.6* 77.8*  PLT 182 185 086    Basic Metabolic Panel: Recent Labs  Lab 01/30/20 0426 01/31/20 0410 02/01/20 0405  NA 148* 148* 146*  K 3.5 3.0* 3.0*  CL 118* 117* 116*  CO2 17* 21* 21*  GLUCOSE 102* 125* 116*  BUN 36* 30* 23  CREATININE 2.29* 1.69* 1.46*  CALCIUM 9.2  9.4 9.4  MG 2.3 2.3 2.2  PHOS 3.1 2.0* 2.1*    Liver Function Tests: Recent Labs  Lab 01/26/20 1532  AST 57*  ALT 19  ALKPHOS 81  BILITOT 1.9*  PROT 6.2*  ALBUMIN 3.4*    CBG: Recent Labs  Lab 01/26/20 2053  GLUCAP 118*    Microbiology Studies:   Recent Results (from the past 240 hour(s))  Resp Panel by RT-PCR (Flu A&B, Covid) Nasopharyngeal Swab     Status: None   Collection Time: 01/26/20  6:29 PM   Specimen: Nasopharyngeal Swab; Nasopharyngeal(NP) swabs in vial transport medium  Result Value Ref Range Status   SARS Coronavirus 2 by RT PCR NEGATIVE NEGATIVE Final    Comment: (NOTE) SARS-CoV-2 target nucleic acids are NOT DETECTED.  The SARS-CoV-2 RNA is generally detectable in upper respiratory specimens during the acute phase of infection. The lowest concentration of SARS-CoV-2 viral copies this assay can detect is 138 copies/mL. A negative result does not preclude SARS-Cov-2 infection and should not be used as the sole basis for treatment or other patient management decisions. A negative result may occur with  improper specimen collection/handling, submission of specimen other than nasopharyngeal swab, presence of viral mutation(s) within the areas targeted by this assay, and inadequate number of viral copies(<138 copies/mL). A negative result must be combined with clinical observations, patient history, and epidemiological information. The expected result is Negative.  Fact Sheet for Patients:  EntrepreneurPulse.com.au  Fact Sheet for Healthcare Providers:  IncredibleEmployment.be  This test is no t yet approved or cleared by the [place] FDA and  has been authorized for detection and/or diagnosis of SARS-CoV-2 by FDA under an Emergency Use Authorization (EUA). This EUA will remain  in effect (meaning this test can be used) for the duration of the COVID-19 declaration under Section 564(b)(1) of the Act,  21 U.S.C.section 360bbb-3(b)(1), unless the authorization is terminated  or revoked sooner.       Influenza A by PCR NEGATIVE NEGATIVE Final   Influenza B by PCR NEGATIVE NEGATIVE Final    Comment: (NOTE) The Xpert Xpress SARS-CoV-2/FLU/RSV plus assay is intended as an aid in the diagnosis of influenza from Nasopharyngeal swab specimens and should not be used as a sole basis for treatment. Nasal washings and aspirates are unacceptable for Xpert Xpress SARS-CoV-2/FLU/RSV testing.  Fact Sheet for Patients: EntrepreneurPulse.com.au  Fact Sheet for Healthcare Providers: IncredibleEmployment.be  This test is not yet approved or cleared by the [place] FDA and has been authorized for detection and/or diagnosis of SARS-CoV-2 by FDA under an Emergency Use Authorization (EUA).  This EUA will remain in effect (meaning this test can be used) for the duration of the COVID-19 declaration under Section 564(b)(1) of the Act, 21 U.S.C. section 360bbb-3(b)(1), unless the authorization is terminated or revoked.  Performed at [facility], [address] [place], [place] [address]   Culture, blood (routine x 2)     Status: None   Collection Time: 01/26/20  6:57 PM   Specimen: BLOOD  Result Value Ref Range Status   Specimen Description BLOOD LEFT ANTECUBITAL  Final   Special Requests   Final    BOTTLES DRAWN AEROBIC AND ANAEROBIC Blood Culture adequate volume   Culture   Final    NO GROWTH 5 DAYS Performed at [facility], [address], [place], [place] [address]    Report Status 01/31/2020 FINAL  Final  Culture, blood (routine x 2)     Status: None   Collection Time: 01/26/20  9:10 PM   Specimen: BLOOD  Result Value Ref Range Status   Specimen Description BLOOD BLOOD LEFT HAND  Final   Special Requests   Final    BOTTLES DRAWN AEROBIC AND ANAEROBIC Blood Culture adequate volume   Culture   Final    NO GROWTH 5  DAYS Performed at [facility], [address] [place], [place] [address]    Report Status 01/31/2020 FINAL  Final  MRSA PCR Screening     Status: None   Collection Time: 01/26/20  9:14 PM   Specimen: Nasopharyngeal  Result Value Ref Range Status   MRSA by PCR NEGATIVE NEGATIVE Final    Comment:        The GeneXpert MRSA Assay (FDA approved for NASAL specimens only), is one component of a comprehensive MRSA colonization surveillance program. It is not intended to diagnose MRSA infection nor to guide or monitor treatment for MRSA infections. Performed at [facility], [address], [place], [place] [address]      Radiology Studies:  US Venous Img Upper Uni Left (DVT)  Result Date: 02/01/2020 CLINICAL DATA:  Swelling x2 days EXAM: LEFT UPPER EXTREMITY VENOUS DOPPLER ULTRASOUND TECHNIQUE: Gray-scale sonography with graded compression, as well as color Doppler and duplex ultrasound were performed to evaluate the upper extremity deep venous system from the level of the subclavian vein and including the jugular, axillary, basilic, radial, ulnar and upper cephalic vein. Spectral Doppler was utilized to evaluate flow at rest and with distal augmentation maneuvers. COMPARISON:  None. FINDINGS: Contralateral Subclavian Vein: Respiratory phasicity is normal and symmetric with the symptomatic side. No evidence of thrombus. Normal compressibility. Internal Jugular Vein: No evidence of thrombus. Normal compressibility, respiratory phasicity and response to augmentation. Subclavian Vein: Hypoechoic partially occlusive thrombus in the peripheral aspect, with continued color Doppler flow signal through this segment. Axillary Vein: Eccentric thrombus resulting in incomplete compressibility, with some continued color Doppler flow signal. Cephalic Vein: No evidence of thrombus. Normal compressibility, respiratory phasicity and response to augmentation. Portions obscured due to  overlying dressing. Basilic Vein: Hypoechoic thrombus resulting in incomplete segmental compressibility. Persistent antegrade color flow signal elsewhere. Brachial Veins: Partially occlusive thrombus Radial Veins: No evidence of thrombus. Normal compressibility, respiratory phasicity and response to augmentation. Ulnar Veins: No evidence of thrombus. Normal compressibility, respiratory phasicity and response to augmentation. Venous Reflux:  None visualized. Other Findings:  None visualized. IMPRESSION: 1. POSITIVE for partially-occlusive left upper extremity DVT involving brachial, axillary, and subclavian veins. 2. Superficial thrombophlebitis involving basilic vein. Electronically Signed   By: Keturah Barre  Vernard Gambles M.D.   On: 02/01/2020 10:22   DG Swallowing Func-Speech Pathology  Result Date: 01/31/2020 Objective Swallowing Evaluation: Type of Study: MBS-Modified Barium Swallow Study  Patient Details Name: Dareion Kneece MRN: [id number] Date of Birth: 1934/04/29 Today's Date: 01/31/2020 Time: SLP Start Time (ACUTE ONLY): 0825 -SLP Stop Time (ACUTE ONLY): 0850 SLP Time Calculation (min) (ACUTE ONLY): 25 min Past Medical History: Past Medical History: Diagnosis Date . Diabetes mellitus without complication ([place] II)  . Hypertension  Past Surgical History: Past Surgical History: Procedure Laterality Date . CARDIAC SURGERY    triple bypass . ESOPHAGOGASTRODUODENOSCOPY N/A 01/28/2020  Procedure: ESOPHAGOGASTRODUODENOSCOPY (EGD);  Surgeon: [name], [name], MD;  Location: [facility] ENDOSCOPY;  Service: Gastroenterology;  Laterality: N/A; HPI: Patient is a 85 y.o male with history of coronary artery disease, coronary artery bypass grafting, complete heart block, atrial fibrillation aortic stenosis carotid artery stenosis, history of hypothyroidism and diabetes type 2, adhesive capsulitis of the left shoulder, chronic microcytic anemia history of choroidal hemangioma, dyslipidemia, chronic B12 deficiency admitted to ED on 11/19 d/t  mechanical fall in garage. Per wife, patient with noted physical and cognitive decline for past 6 months. CT showed infarcts in R frontal, parietal, and BG areas with additional upper GI bleed dx. D/t acute respiratory failure, patient intubated on 11/19 and recently extubated 11/22. SLP to complete BSE to evaluate patient's oropharyngeal swallowing function and ability to resume PO diet.  Subjective: pt pleasant, not very interaction Assessment / Plan / Recommendation CHL IP CLINICAL IMPRESSIONS 01/31/2020 Clinical Impression Severe oral phase dysphagia and milder pharyngeal phase dysphagia when consuming nectar thick liquids, thin liquids via straw and puree. Pt's oral phase is c/b weak lingual manipulation and reduced posterior propulsion of bolus. This results in delayed oral transit, decreased bolus cohesion, premature spillage of boluses and piecemeal swallowing. Trace oral residue remains post swallow. Pt's pharyngeal phase is c/b swallow initiation at the vallecula with nectar thick liquids and to the level of the pyriform sinuses with thin liquids. Penetration and aspiration were not observed during the study. More advanced solids were not presented d/t severity of oral phase deficits. Recommend initiating a dysphagia 1 diet with thin liquids (straws ok) and medicine crushed in puree. ST to continue following pt.    SLP Visit Diagnosis Dysphagia, oropharyngeal phase (R13.12) Attention and concentration deficit following -- Frontal lobe and executive function deficit following -- Impact on safety and function Mild aspiration risk   CHL IP TREATMENT RECOMMENDATION 01/31/2020 Treatment Recommendations Therapy as outlined in treatment plan below   Prognosis 01/31/2020 Prognosis for Safe Diet Advancement Fair Barriers to Reach Goals Cognitive deficits;Time post onset;Severity of deficits Barriers/Prognosis Comment -- CHL IP DIET RECOMMENDATION 01/31/2020 SLP Diet Recommendations Dysphagia 1 (Puree) solids;Thin  liquid Liquid Administration via Straw;Cup Medication Administration Crushed with puree Compensations Minimize environmental distractions;Slow rate;Small sips/bites Postural Changes Seated upright at 90 degrees   CHL IP OTHER RECOMMENDATIONS 01/31/2020 Recommended Consults -- Oral Care Recommendations Oral care BID Other Recommendations --   CHL IP FOLLOW UP RECOMMENDATIONS 01/31/2020 Follow up Recommendations (No Data)   CHL IP FREQUENCY AND DURATION 01/31/2020 Speech Therapy Frequency (ACUTE ONLY) min 3x week Treatment Duration 2 weeks      CHL IP ORAL PHASE 01/31/2020 Oral Phase Impaired Oral - Pudding Teaspoon -- Oral - Pudding Cup -- Oral - Honey Teaspoon -- Oral - Honey Cup -- Oral - Nectar Teaspoon Weak lingual manipulation;Incomplete tongue to palate contact;Reduced posterior propulsion;Holding of bolus;Piecemeal swallowing;Delayed oral transit;Decreased bolus cohesion;Premature spillage;Lingual/palatal residue Oral - Nectar  Cup Weak lingual manipulation;Incomplete tongue to palate contact;Reduced posterior propulsion;Holding of bolus;Lingual/palatal residue;Piecemeal swallowing;Delayed oral transit;Decreased bolus cohesion;Premature spillage Oral - Nectar Straw -- Oral - Thin Teaspoon Weak lingual manipulation;Incomplete tongue to palate contact;Reduced posterior propulsion;Holding of bolus;Lingual/palatal residue;Piecemeal swallowing;Delayed oral transit;Decreased bolus cohesion;Premature spillage Oral - Thin Cup -- Oral - Thin Straw Weak lingual manipulation;Incomplete tongue to palate contact;Reduced posterior propulsion;Holding of bolus;Piecemeal swallowing;Delayed oral transit;Decreased bolus cohesion;Premature spillage;Lingual/palatal residue Oral - Puree Weak lingual manipulation;Incomplete tongue to palate contact;Reduced posterior propulsion;Holding of bolus;Lingual/palatal residue;Piecemeal swallowing;Delayed oral transit;Decreased bolus cohesion;Premature spillage Oral - Mech Soft -- Oral -  Regular -- Oral - Multi-Consistency -- Oral - Pill -- Oral Phase - Comment severe impairments  CHL IP PHARYNGEAL PHASE 01/31/2020 Pharyngeal Phase Impaired Pharyngeal- Pudding Teaspoon -- Pharyngeal -- Pharyngeal- Pudding Cup -- Pharyngeal -- Pharyngeal- Honey Teaspoon -- Pharyngeal -- Pharyngeal- Honey Cup -- Pharyngeal -- Pharyngeal- Nectar Teaspoon Delayed swallow initiation-vallecula;Reduced tongue base retraction Pharyngeal -- Pharyngeal- Nectar Cup Delayed swallow initiation-vallecula;Reduced tongue base retraction Pharyngeal -- Pharyngeal- Nectar Straw -- Pharyngeal -- Pharyngeal- Thin Teaspoon Delayed swallow initiation-pyriform sinuses;Reduced tongue base retraction Pharyngeal -- Pharyngeal- Thin Cup -- Pharyngeal -- Pharyngeal- Thin Straw Delayed swallow initiation-pyriform sinuses;Reduced tongue base retraction Pharyngeal -- Pharyngeal- Puree Delayed swallow initiation-vallecula;Reduced tongue base retraction Pharyngeal -- Pharyngeal- Mechanical Soft -- Pharyngeal -- Pharyngeal- Regular -- Pharyngeal -- Pharyngeal- Multi-consistency -- Pharyngeal -- Pharyngeal- Pill -- Pharyngeal -- Pharyngeal Comment pharyngeal phase is slow and is without penetration or aspiration  CHL IP CERVICAL ESOPHAGEAL PHASE 01/31/2020 Cervical Esophageal Phase WFL Pudding Teaspoon -- Pudding Cup -- Honey Teaspoon -- Honey Cup -- Nectar Teaspoon -- Nectar Cup -- Nectar Straw -- Thin Teaspoon -- Thin Cup -- Thin Straw -- Puree -- Mechanical Soft -- Regular -- Multi-consistency -- Pill -- Cervical Esophageal Comment -- Happi B. Rutherford Nail M.S., CCC-SLP, [facility] Speech-Language Pathologist Rehabilitation Services Office [phone] Happi Rutherford Nail 01/31/2020, 10:14 AM                Scheduled Meds:   . aspirin  81 mg Oral Daily  . chlorhexidine gluconate (MEDLINE KIT)  15 mL Mouth Rinse BID  . clopidogrel  75 mg Oral Daily  . docusate  100 mg Oral BID  . heparin  5,000 Units Subcutaneous Q8H  . pantoprazole  40 mg Intravenous  Q12H  . phosphorus  500 mg Oral BID  . polyethylene glycol  17 g Oral Daily  . potassium chloride  40 mEq Oral Q4H  . sodium chloride flush  10-40 mL Intracatheter Q12H    Continuous Infusions:   . dextrose 5 % with KCl 20 mEq / L 20 mEq (02/01/20 0612)     LOS: 6 days     Vernell Leep, MD, [place], [facility]. [facility]    To contact the attending provider between 7A-7P or the covering provider during after hours 7P-7A, please log into the web site [email or web address] and access using universal [place] password for that web site. If you do not have the password, please call the hospital operator.  02/01/2020, 1:47 PM

## 2020-02-01 NOTE — Consult Note (Signed)
PHARMACY CONSULT NOTE - FOLLOW UP  Pharmacy Consult for Electrolyte Monitoring and Replacement   Recent Labs: Potassium (mmol/L)  Date Value  02/01/2020 3.0 (L)   Magnesium (mg/dL)  Date Value  02/01/2020 2.2   Calcium (mg/dL)  Date Value  02/01/2020 9.4   Albumin (g/dL)  Date Value  01/26/2020 3.4 (L)   Phosphorus (mg/dL)  Date Value  02/01/2020 2.1 (L)   Sodium (mmol/L)  Date Value  02/01/2020 146 (H)     Assessment: 85 year old male with PMH of CAD, CABG, complete heart block, atrial fibrillation, aortic stenosis, carotid artery stenosis, hypothyroid, DM 2, chronic microcytic anemia, HLD, chronic B12 deficiency with CVA and mechanical fall.  Pharmacy has been consulted to replenish and monitor electrolytes  Goal of Therapy:  Electrolytes wnl's  Plan:  KCl 40 mEq x 2 doses + D5 with KCl 20 mEq KPhos 500 mg BID x 2 doses.   Will recheck Mg, K, and Phos with am labs  Oswald Hillock ,PharmD Clinical Pharmacist 02/01/2020 9:03 AM

## 2020-02-01 NOTE — Consult Note (Signed)
[place] for Apixaban Indication: LUE acute DVT, also h/o AFib  No Known Allergies  Patient Measurements: Height: 5\' 8"  (172.7 cm) Weight: 76.6 kg (168 lb 14 oz) IBW/kg (Calculated) : 68.4  Vital Signs: Temp: 98.6 F (37 C) (11/25 0408) BP: 148/57 (11/25 0408) Pulse Rate: 73 (11/25 0408)  Labs: Recent Labs    01/30/20 0426 01/30/20 0426 01/31/20 0410 02/01/20 0405  HGB 9.8*   < > 9.4* 9.2*  HCT 33.5*  --  31.9* 30.8*  PLT 182  --  185 164  CREATININE 2.29*  --  1.69* 1.46*   < > = values in this interval not displayed.    Estimated Creatinine Clearance: 37.1 mL/min (A) (by C-G formula based on SCr of 1.46 mg/dL (H)).   Medical History: Past Medical History:  Diagnosis Date   Diabetes mellitus without complication ([place])    Hypertension     Medications:  Per chart review, no PTA anticoagulation   Assessment: 85yo male with PMH of CAD, CABG, complete heart block, atrial fibrillation, aortic stenosis, carotid artery stenosis, hypothyroid, DM 2, chronic microcytic anemia, HLD, chronic B12 deficiency. Patient was previously on ppx heparin and plavix which have now been discontinued. [place] leg positive for partially-occlusive left upper extremity DVT involving brachial, axillary, and subclavian veins. Pharmacy has been consulted for apixaban dosing for LUE acute DVT, also h/o Afib.  Hgb 9.2 looks to be around patient's baseline. Plt 164  Goal of Therapy:  Monitor platelets by anticoagulation protocol: Yes   Plan:  Apixaban 10mg  BID x 7 days followed by 5mg  BID Monitor CBC per protocol  Sherilyn Banker, PharmD Pharmacy Resident  02/01/2020 3:18 PM

## 2020-02-02 DIAGNOSIS — I472 Ventricular tachycardia: Secondary | ICD-10-CM

## 2020-02-02 DIAGNOSIS — Z515 Encounter for palliative care: Secondary | ICD-10-CM | POA: Diagnosis not present

## 2020-02-02 DIAGNOSIS — Z66 Do not resuscitate: Secondary | ICD-10-CM

## 2020-02-02 DIAGNOSIS — Z7189 Other specified counseling: Secondary | ICD-10-CM | POA: Diagnosis not present

## 2020-02-02 DIAGNOSIS — I82622 Acute embolism and thrombosis of deep veins of left upper extremity: Secondary | ICD-10-CM

## 2020-02-02 DIAGNOSIS — I639 Cerebral infarction, unspecified: Secondary | ICD-10-CM

## 2020-02-02 DIAGNOSIS — R1310 Dysphagia, unspecified: Secondary | ICD-10-CM | POA: Diagnosis not present

## 2020-02-02 DIAGNOSIS — R6251 Failure to thrive (child): Secondary | ICD-10-CM

## 2020-02-02 LAB — CBC WITH DIFFERENTIAL/PLATELET
Abs Immature Granulocytes: 0.02 10*3/uL (ref 0.00–0.07)
Basophils Absolute: 0 10*3/uL (ref 0.0–0.1)
Basophils Relative: 0 %
Eosinophils Absolute: 0.2 10*3/uL (ref 0.0–0.5)
Eosinophils Relative: 4 %
HCT: 30.8 % — ABNORMAL LOW (ref 39.0–52.0)
Hemoglobin: 9 g/dL — ABNORMAL LOW (ref 13.0–17.0)
Immature Granulocytes: 0 %
Lymphocytes Relative: 16 %
Lymphs Abs: 0.8 10*3/uL (ref 0.7–4.0)
MCH: 22.7 pg — ABNORMAL LOW (ref 26.0–34.0)
MCHC: 29.2 g/dL — ABNORMAL LOW (ref 30.0–36.0)
MCV: 77.8 fL — ABNORMAL LOW (ref 80.0–100.0)
Monocytes Absolute: 0.3 10*3/uL (ref 0.1–1.0)
Monocytes Relative: 6 %
Neutro Abs: 3.6 10*3/uL (ref 1.7–7.7)
Neutrophils Relative %: 74 %
Platelets: 167 10*3/uL (ref 150–400)
RBC: 3.96 MIL/uL — ABNORMAL LOW (ref 4.22–5.81)
RDW: 23.6 % — ABNORMAL HIGH (ref 11.5–15.5)
Smear Review: NORMAL
WBC: 4.9 10*3/uL (ref 4.0–10.5)
nRBC: 0 % (ref 0.0–0.2)

## 2020-02-02 LAB — BASIC METABOLIC PANEL
Anion gap: 7 (ref 5–15)
BUN: 19 mg/dL (ref 8–23)
CO2: 23 mmol/L (ref 22–32)
Calcium: 9.1 mg/dL (ref 8.9–10.3)
Chloride: 112 mmol/L — ABNORMAL HIGH (ref 98–111)
Creatinine, Ser: 1.22 mg/dL (ref 0.61–1.24)
GFR, Estimated: 59 mL/min — ABNORMAL LOW (ref >60)
Glucose, Bld: 110 mg/dL — ABNORMAL HIGH (ref 70–99)
Potassium: 3.2 mmol/L — ABNORMAL LOW (ref 3.5–5.1)
Sodium: 142 mmol/L (ref 135–145)

## 2020-02-02 LAB — PHOSPHORUS: Phosphorus: 2.7 mg/dL (ref 2.5–4.6)

## 2020-02-02 MED ORDER — CARVEDILOL 3.125 MG PO TABS
3.1250 mg | ORAL_TABLET | Freq: Two times a day (BID) | ORAL | Status: DC
  Administered 2020-02-02 – 2020-02-03 (×2): 3.125 mg via ORAL
  Filled 2020-02-02 (×2): qty 1

## 2020-02-02 MED ORDER — POTASSIUM CHLORIDE 10 MEQ/100ML IV SOLN
10.0000 meq | INTRAVENOUS | Status: AC
  Administered 2020-02-02 (×6): 10 meq via INTRAVENOUS
  Filled 2020-02-02 (×2): qty 100

## 2020-02-02 MED ORDER — POTASSIUM CL IN DEXTROSE 5% 20 MEQ/L IV SOLN
20.0000 meq | INTRAVENOUS | Status: DC
  Administered 2020-02-02: 13:00:00 20 meq via INTRAVENOUS
  Filled 2020-02-02 (×2): qty 1000

## 2020-02-02 MED ORDER — PANTOPRAZOLE SODIUM 40 MG PO TBEC
40.0000 mg | DELAYED_RELEASE_TABLET | Freq: Two times a day (BID) | ORAL | Status: DC
  Administered 2020-02-02 – 2020-02-07 (×10): 40 mg via ORAL
  Filled 2020-02-02 (×10): qty 1

## 2020-02-02 NOTE — Plan of Care (Signed)
  Problem: Education: Goal: Knowledge of General Education information will improve Description: Including pain rating scale, medication(s)/side effects and non-pharmacologic comfort measures Outcome: Progressing   Problem: Clinical Measurements: Goal: Ability to maintain clinical measurements within normal limits will improve Outcome: Progressing Goal: Will remain free from infection Outcome: Progressing Goal: Diagnostic test results will improve Outcome: Progressing Goal: Respiratory complications will improve Outcome: Progressing Goal: Cardiovascular complication will be avoided Outcome: Progressing   Problem: Elimination: Goal: Will not experience complications related to bowel motility Outcome: Progressing Goal: Will not experience complications related to urinary retention Outcome: Progressing   Problem: Pain Managment: Goal: General experience of comfort will improve Outcome: Progressing   Problem: Safety: Goal: Ability to remain free from injury will improve Outcome: Progressing   Problem: Skin Integrity: Goal: Risk for impaired skin integrity will decrease Outcome: Progressing   Problem: Education: Goal: Knowledge of disease or condition will improve Outcome: Progressing Goal: Knowledge of secondary prevention will improve Outcome: Progressing Goal: Knowledge of patient specific risk factors addressed and post discharge goals established will improve Outcome: Progressing   Problem: Coping: Goal: Will identify appropriate support needs Outcome: Progressing   Problem: Self-Care: Goal: Ability to communicate needs accurately will improve Outcome: Progressing   Problem: Nutrition: Goal: Risk of aspiration will decrease Outcome: Progressing

## 2020-02-02 NOTE — Progress Notes (Signed)
Occupational Therapy Treatment Patient Details Name: Eric Kennedy MRN: [id number] DOB: 1935/02/15 Today's Date: 02/02/2020    History of present illness Patient is an 85 y.o. male presenting to the emergency room after being found down by patient's wife.  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  Patient has left dense hemiparesis.  Patient intubated and now extubated on 11/22. Acute hemorrhagic shock with severe anemia with hemoglobin of 4.7 initially. Patient has a known history of carotid stenosis, diabetes mellitus type 2, chronic anemia, complete heart block, HTN, atrial fibrillation, s/p CABG.    OT comments  Pt seen for OT tx this date to f/u re: safety with ADLs and strengthening as it pertains to ADL mobility. Pt requires MIN A for hand to mouth with cup with lid/straw. In addition, OT engages pt in below-listed exercises. Pt with spouse/son present throughout. Continue to anticipate pt will require f/u therapy after hospital stay. Pt shows progress and improving tolerance for therapy. Updated d/c recommendation to CIR to reflect progression and in relation to his INDEP PLOF. Will continue to follow.    Follow Up Recommendations  CIR    Equipment Recommendations  Other (comment) (defer to next level of care)    Recommendations for Other Services      Precautions / Restrictions Precautions Precautions: Fall Precaution Comments: L hemi, L inattention (appers to be improving) Restrictions Weight Bearing Restrictions: No       Mobility Bed Mobility Overal bed mobility: Needs Assistance Bed Mobility: Supine to Sit;Sit to Supine     Supine to sit: Max assist;HOB elevated Sit to supine: Total assist;HOB elevated   General bed mobility comments: NT  Transfers                 General transfer comment: NT    Balance Overall balance assessment: Needs assistance Sitting-balance support: Feet supported;Bilateral upper extremity  supported Sitting balance-Leahy Scale: Poor Sitting balance - Comments: NT       Standing balance comment: NT                           ADL either performed or assessed with clinical judgement   ADL Overall ADL's : Needs assistance/impaired Eating/Feeding: Minimal assistance;Bed level Eating/Feeding Details (indicate cue type and reason): HOB elevated, MIN A to perform hand to mouth with cup with lid/straw. Improved proprioception this date versus evaluation                                         Vision Patient Visual Report: Other (comment) (pt still with some inattention to L side, but appears to be improving this date, pt turning head to my voice, tracking me on the L side of the room. Generally more attempting/better visual tracking noted this date.)     Perception     Praxis      Cognition Arousal/Alertness: Awake/alert Behavior During Therapy: Flat affect Overall Cognitive Status: Impaired/Different from baseline Area of Impairment: Following commands;Safety/judgement;Orientation;Attention;Memory;Awareness;Problem solving                 Orientation Level: Disoriented to;Time;Situation (reports he is in the hospital d/t a fall) Current Attention Level: Sustained;Alternating Memory: Decreased recall of precautions;Decreased short-term memory Following Commands: Follows one step commands with increased time;Follows one step commands inconsistently Safety/Judgement: Decreased awareness of deficits;Decreased awareness of safety Awareness: Intellectual  Problem Solving: Slow processing;Decreased initiation;Requires tactile cues;Difficulty sequencing;Requires verbal cues General Comments: Pt with improved general attention/conversation this date. Pt appears to be in better spirits and has family members visiting.        Exercises Other Exercises Other Exercises: OT engages pt in bed level finger to nose with R hand x6 reps in different  planes/areas of foci. Pt with improved proprioception and decreased dysmetria this date. OT also engages pt in 1 set x10 reps digit flex/ext, digit isolation/thumb opposition x5 reps per digit and 10 squeezes/grip strengtheners against light resistance. Pt tolerates well and follows commands well with visual/tactile cues.   Shoulder Instructions       General Comments      Pertinent Vitals/ Pain       Pain Assessment: Faces Faces Pain Scale: No hurt  Home Living                                          Prior Functioning/Environment              Frequency  Min 1X/week        Progress Toward Goals  OT Goals(current goals can now be found in the care plan section)  Progress towards OT goals: Progressing toward goals  Acute Rehab OT Goals Patient Stated Goal: none stated OT Goal Formulation: Patient unable to participate in goal setting Time For Goal Achievement: 02/13/20 Potential to Achieve Goals: [place] Discharge plan remains appropriate    Co-evaluation                 AM-PAC OT "6 Clicks" Daily Activity     Outcome Measure   Help from another person eating meals?: A Lot Help from another person taking care of personal grooming?: A Lot Help from another person toileting, which includes using toliet, bedpan, or urinal?: Total Help from another person bathing (including washing, rinsing, drying)?: Total Help from another person to put on and taking off regular upper body clothing?: Total Help from another person to put on and taking off regular lower body clothing?: Total 6 Click Score: 8    End of Session    OT Visit Diagnosis: Other abnormalities of gait and mobility (R26.89);Other symptoms and signs involving the nervous system (R29.898);Other symptoms and signs involving cognitive function;Hemiplegia and hemiparesis Hemiplegia - Right/Left: Left Hemiplegia - caused by: Cerebral infarction   Activity Tolerance Patient limited  by fatigue   Patient Left in bed;with call bell/phone within reach;with bed alarm set   Nurse Communication          Time: 9024-0973 OT Time Calculation (min): 39 min  Charges: OT General Charges $OT Visit: 1 Visit OT Treatments $Self Care/Home Management : 8-22 mins $Therapeutic Exercise: 23-37 mins  Gerrianne Scale, [place], OTR/L ascom [phone] 02/02/20, 5:05 PM

## 2020-02-02 NOTE — Progress Notes (Signed)
This nurse spoke to [place] in dietary services and provided the list of foods pt likes to eat (per son). Pt remains on dysphagia I with thin liquids. Son fed pt vanilla pudding and puree berries prior to dinner - pt consumed 100 percent. Son also fed pt dinner in which he consumed greater than 25 percent but less than 50 percent. No coughing noted during feedings. NAD noted, will continue to monitor.

## 2020-02-02 NOTE — Plan of Care (Signed)
  Problem: Education: Goal: Knowledge of General Education information will improve Description: Including pain rating scale, medication(s)/side effects and non-pharmacologic comfort measures Outcome: Not Progressing   Problem: Health Behavior/Discharge Planning: Goal: Ability to manage health-related needs will improve Outcome: Not Progressing   Problem: Clinical Measurements: Goal: Ability to maintain clinical measurements within normal limits will improve Outcome: Not Progressing Goal: Will remain free from infection Outcome: Not Progressing Goal: Diagnostic test results will improve Outcome: Not Progressing Goal: Respiratory complications will improve Outcome: Not Progressing Goal: Cardiovascular complication will be avoided Outcome: Not Progressing   Problem: Activity: Goal: Risk for activity intolerance will decrease Outcome: Not Progressing   Problem: Nutrition: Goal: Adequate nutrition will be maintained Outcome: Not Progressing   Problem: Elimination: Goal: Will not experience complications related to bowel motility Outcome: Not Progressing Goal: Will not experience complications related to urinary retention Outcome: Not Progressing   Problem: Pain Managment: Goal: General experience of comfort will improve Outcome: Not Progressing   Problem: Safety: Goal: Ability to remain free from injury will improve Outcome: Not Progressing   Problem: Skin Integrity: Goal: Risk for impaired skin integrity will decrease Outcome: Not Progressing   Problem: Education: Goal: Knowledge of disease or condition will improve Outcome: Not Progressing Goal: Knowledge of secondary prevention will improve Outcome: Not Progressing Goal: Knowledge of patient specific risk factors addressed and post discharge goals established will improve Outcome: Not Progressing   Problem: Coping: Goal: Will identify appropriate support needs Outcome: Not Progressing   Problem: Health  Behavior/Discharge Planning: Goal: Ability to manage health-related needs will improve Outcome: Not Progressing   Problem: Self-Care: Goal: Ability to participate in self-care as condition permits will improve Outcome: Not Progressing Goal: Ability to communicate needs accurately will improve Outcome: Not Progressing   Problem: Nutrition: Goal: Risk of aspiration will decrease Outcome: Not Progressing

## 2020-02-02 NOTE — Consult Note (Signed)
Consultation Note Date: 02/02/2020   Patient Name: Eric Kennedy  DOB: 1934/11/12  MRN: [id number]  Age / Sex: 85 y.o., male  PCP: [name], MD Referring Physician: Modena Jansky, MD  Reason for Consultation: Establishing goals of care  HPI/Patient Profile: 85 y.o. male  with past medical history of CAD, CABG, complete heart block, atrial fibrillation, aortic stenosis, carotid artery stenosis, hypothyroidism, T2DM, chronic microcytic anemia, HLD, and chronic B12 deficiency admitted on 01/26/2020 after being found down by his wife. CT head revealed watershed infarcts. Also found to have hgb of 4.7. EGD 11/21 showed gastritis and gastric ulcer, placed on PPI twice daily.  He developed acute hypoxic respiratory failure, intubated for airway protection, extubated 11/22.  Dysphagia 1 diet started after MBS on 11/24. Left upper extremity venous Doppler to evaluate for LUE swelling confirmed DVT on 11/25. Very poor PO intake. PMT consulted to discuss [place].  Clinical Assessment and Goals of Care: I have reviewed medical records including EPIC notes, labs and imaging, received report from RN, assessed the patient and then spoke with patient's son [name]  to discuss diagnosis prognosis, [place], EOL wishes, disposition and options.  Patient has HCPOA/living will documentation in [place]. Wife is listed as primary HCPOA with son [name] as second. Per chart review, wife has some questionable dementia and [name] has been serving as Tax adviser.  On my evaluation of patient he is sleeping, wakes to gentle physical stimulation. Does not speak - nods head yes or no to questions. Denies discomfort. Nods yes to feeling hungry. Food tray at bedside - had a few bites of peaches and a few sips of milk. Falls asleep quickly when not being stimulated.   When I called [name], I introduced Palliative Medicine as specialized medical care for people living with serious  illness. It focuses on providing relief from the symptoms and stress of a serious illness. The goal is to improve quality of life for both the patient and the family.  [name] shares with me that he is interested in discussing goals of care further however he would prefer to have this conversation in person. He is unable to meet this afternoon d/t an appointment. He asks if we can meet in person Monday.   [name] shares that he lives in [place] and flew in to support his parents during this hospitalization.    We briefly discussed patient's current illness and what it means in the larger context of patient's on-going co-morbidities.  Discussed patient's poor intake - [name] does confirm that he would be interested in short term tube feeding if indicated and then reevaluate next week.  [name] also shares his interest in patient transition to SNF for rehab. He asks about this process - answered questions to the best of my ability.   We discussed that if patient d/c's prior to [place] meeting on Monday that palliative care could follow up with him outpatient.   Questions and concerns were addressed. The family was encouraged to call with questions or concerns.   Primary Decision Maker HCPOA - son [name] (patient unable and wife with ?dementia)   SUMMARY OF RECOMMENDATIONS   - son okay with temporary tube feeding if indicated - scheduled formal goals of care discussion Monday at 2 PM with Quinn Axe, NP - **if patient to d/c prior to scheduled meeting please write for palliative care follow up at rehab in discharge summary** - discussed this with son who was agreeable - DNR per previous discussions  Code  Status/Advance Care Planning:  DNR  Discharge Planning: [place] for rehab with Palliative care service follow-up      Primary Diagnoses: Present on Admission: . Upper GI bleed   I have reviewed the medical record, interviewed the patient and family, and examined the patient. The  following aspects are pertinent.  Past Medical History:  Diagnosis Date  . Diabetes mellitus without complication ([place])   . Hypertension    Social History   Socioeconomic History  . Marital status: Married    Spouse name: Not on file  . Number of children: Not on file  . Years of education: Not on file  . Highest education level: Not on file  Occupational History  . Not on file  Tobacco Use  . Smoking status: Former Research scientist (life sciences)  . Smokeless tobacco: Never Used  Substance and Sexual Activity  . Alcohol use: Never  . Drug use: Never  . Sexual activity: Not on file  Other Topics Concern  . Not on file  Social History Narrative  . Not on file   Social Determinants of Health   Financial Resource Strain:   . Difficulty of Paying Living Expenses: Not on file  Food Insecurity:   . Worried About Charity fundraiser in the Last Year: Not on file  . Ran Out of Food in the Last Year: Not on file  Transportation Needs:   . Lack of Transportation (Medical): Not on file  . Lack of Transportation (Non-Medical): Not on file  Physical Activity:   . Days of Exercise per Week: Not on file  . Minutes of Exercise per Session: Not on file  Stress:   . Feeling of Stress : Not on file  Social Connections:   . Frequency of Communication with Friends and Family: Not on file  . Frequency of Social Gatherings with Friends and Family: Not on file  . Attends Religious Services: Not on file  . Active Member of Clubs or Organizations: Not on file  . Attends Archivist Meetings: Not on file  . Marital Status: Not on file   Family History  Problem Relation Age of Onset  . Diabetes Mother   . Cancer Brother    Scheduled Meds: . apixaban  10 mg Oral BID   Followed by  . [START ON 02/08/2020] apixaban  5 mg Oral BID  . aspirin  81 mg Oral Daily  . chlorhexidine gluconate (MEDLINE KIT)  15 mL Mouth Rinse BID  . docusate  100 mg Oral BID  . pantoprazole  40 mg Intravenous Q12H  .  polyethylene glycol  17 g Oral Daily  . sodium chloride flush  10-40 mL Intracatheter Q12H   Continuous Infusions: . dextrose 5 % with KCl 20 mEq / L 20 mEq (02/02/20 0750)  . potassium chloride 10 mEq (02/02/20 0941)   PRN Meds:.influenza vaccine adjuvanted, polyethylene glycol No Known Allergies Review of Systems  Unable to perform ROS: Mental status change    Physical Exam Constitutional:      Comments: Lethargic - wakes briefly to physical stimulation  Pulmonary:     Effort: Pulmonary effort is normal.  Abdominal:     Palpations: Abdomen is soft.  Musculoskeletal:     Right lower leg: No edema.     Left lower leg: No edema.  Skin:    General: Skin is warm and dry.  Neurological:     Mental Status: He is disoriented.     Vital Signs: BP (!) 139/58 (  BP Location: Right Arm)   Pulse 73   Temp 98 F (36.7 C)   Resp 18   Ht '5\' 8"'  (1.727 m)   Wt 76.6 kg   SpO2 95%   BMI 25.68 kg/m  Pain Scale: 0-10   Pain Score: 0-No pain   SpO2: SpO2: 95 % O2 Device:SpO2: 95 % O2 Flow Rate: .O2 Flow Rate (L/min): 2 L/min  IO: Intake/output summary:   Intake/Output Summary (Last 24 hours) at 02/02/2020 1040 Last data filed at 02/02/2020 0848 Gross per 24 hour  Intake 1455 ml  Output 400 ml  Net 1055 ml    LBM: Last BM Date:  (no stool charted since admission) Baseline Weight: Weight: 74.8 kg Most recent weight: Weight: 76.6 kg     Palliative Assessment/Data: PPS 20%   Time Total: 35 minutes Greater than 50%  of this time was spent counseling and coordinating care related to the above assessment and plan.  Juel Burrow, DNP, AGNP-C Palliative Medicine Team [phone] Pager: [phone]

## 2020-02-02 NOTE — Progress Notes (Signed)
  Speech Language Pathology Treatment: Dysphagia  Patient Details Name: Eric Kennedy MRN: [id number] DOB: 1935/01/03 Today's Date: 02/02/2020 Time: 1255-1310 SLP Time Calculation (min) (ACUTE ONLY): 15 min  Assessment / Plan / Recommendation Clinical Impression  Skilled observation provided of pt consuming puree with thin liquids via straw. Pt's oropharyngeal deficits continue to be severe therefore it is laborsome for pt to consume POs. Pt reports overall disinterest in eating. Education provided on need to maintain nutritional status. SLP attempted to contact pt's son but his contact info is not in chart and this writer was not able to reach pt's nurse. Will continue efforts.    HPI HPI: Patient is a 85 y.o male with history of coronary artery disease, coronary artery bypass grafting, complete heart block, atrial fibrillation aortic stenosis carotid artery stenosis, history of hypothyroidism and diabetes type 2, adhesive capsulitis of the left shoulder, chronic microcytic anemia history of choroidal hemangioma, dyslipidemia, chronic B12 deficiency admitted to ED on 11/19 d/t mechanical fall in garage. Per wife, patient with noted physical and cognitive decline for past 6 months. CT showed infarcts in R frontal, parietal, and BG areas with additional upper GI bleed dx. D/t acute respiratory failure, patient intubated on 11/19 and recently extubated 11/22. SLP to complete BSE to evaluate patient's oropharyngeal swallowing function and ability to resume PO diet.      SLP Plan  Continue with current plan of care       Recommendations  Diet recommendations: Dysphagia 1 (puree);Thin liquid Liquids provided via: Cup Medication Administration: Crushed with puree Supervision: Staff to assist with self feeding;Full supervision/cueing for compensatory strategies Compensations: Minimize environmental distractions;Slow rate;Small sips/bites Postural Changes and/or Swallow Maneuvers: Seated upright 90  degrees                Oral Care Recommendations: Oral care BID Follow up Recommendations: Skilled Nursing facility SLP Visit Diagnosis: Dysphagia, oropharyngeal phase (R13.12) Plan: Continue with current plan of care       GO               Eric Kennedy B. Rutherford Nail M.S., CCC-SLP, [place] Office [phone]  Eric Kennedy 02/02/2020, 2:35 PM

## 2020-02-02 NOTE — TOC Progression Note (Addendum)
Transition of Care [facility]) - Progression Note    Patient Details  Name: Eric Kennedy MRN: [id number] Date of Birth: Jun 09, 1934  Transition of Care [facility]) CM/SW [place], LCSW Phone Number: 02/02/2020, 1:15 PM  Clinical Narrative:   CSW reached out to Admissions at [facility], [facility], and [facility] and asked them to review referral. Bed request still pending for these facilities. Informed them that per Dr. Algis Liming, unknown discharge date, likely not until after the weekend.    Expected Discharge Plan: [place] Barriers to Discharge: Continued Medical Work up  Expected Discharge Plan and Services Expected Discharge Plan: [place] In-house Referral: Clinical Social Work   Post Acute Care Choice: [place] arrangements for the past 2 months: Single Family Home                                       Social Determinants of Health (SDOH) Interventions    Readmission Risk Interventions No flowsheet data found.

## 2020-02-02 NOTE — Care Management Important Message (Signed)
Important Message  Patient Details  Name: Eric Kennedy MRN: [id number] Date of Birth: 1934/06/09   Medicare Important Message Given:  Yes     Dannette Barbara 02/02/2020, 3:01 PM

## 2020-02-02 NOTE — Consult Note (Signed)
PHARMACY CONSULT NOTE - FOLLOW UP  Pharmacy Consult for Electrolyte Monitoring and Replacement   Recent Labs: Potassium (mmol/L)  Date Value  02/02/2020 3.2 (L)   Magnesium (mg/dL)  Date Value  02/01/2020 2.2   Calcium (mg/dL)  Date Value  02/02/2020 9.1   Albumin (g/dL)  Date Value  01/26/2020 3.4 (L)   Phosphorus (mg/dL)  Date Value  02/02/2020 2.7   Sodium (mmol/L)  Date Value  02/02/2020 142     Assessment: 85 year old male with PMH of CAD, CABG, complete heart block, atrial fibrillation, aortic stenosis, carotid artery stenosis, hypothyroid, DM 2, chronic microcytic anemia, HLD, chronic B12 deficiency with CVA and mechanical fall.  Serum creatinine improving (2.64>>1.22)  Phosphorous stable per above.  Pharmacy has been consulted to replenish and monitor electrolytes  Goal of Therapy:  Electrolytes wnl's  Plan:  KCl 10 mEq IV x 6 doses ordered by physician + D5 with KCl 20 mEq @ 75  Will recheck K and Phos with am labs  Eric Kennedy ,PharmD Clinical Pharmacist 02/02/2020 7:22 AM

## 2020-02-02 NOTE — Progress Notes (Signed)
Eric Kennedy visited pt. as follow-up from visits in ED and ICU earlier this week; pt. lying in bed, extubated since CH's last visit, able to talk a little though speech seems affected by stroke.  Son and wife at bedside; son shared plan is discharge to SNF for rehab once pt. is ready for discharge.  Wife and son acknowledge pt.'s rehab will be difficult, but are optimistic that he will be able to eventually return to his home.  CH remains available as needed.

## 2020-02-02 NOTE — Consult Note (Signed)
ANTICOAGULATION CONSULT NOTE  Pharmacy Consult for Apixaban Indication: LUE acute DVT, also h/o AFib  No Known Allergies  Patient Measurements: Height: 5\' 8"  (172.7 cm) Weight: 76.6 kg (168 lb 14 oz) IBW/kg (Calculated) : 68.4  Vital Signs: Temp: 98 F (36.7 C) (11/26 0450) BP: 126/46 (11/26 0450) Pulse Rate: 69 (11/26 0450)  Labs: Recent Labs    01/31/20 0410 01/31/20 0410 02/01/20 0405 02/02/20 0504  HGB 9.4*   < > 9.2* 9.0*  HCT 31.9*  --  30.8* 30.8*  PLT 185  --  164 167  CREATININE 1.69*  --  1.46* 1.22   < > = values in this interval not displayed.    Estimated Creatinine Clearance: 44.4 mL/min (by C-G formula based on SCr of 1.22 mg/dL).   Medical History: Past Medical History:  Diagnosis Date  . Diabetes mellitus without complication ([place])   . Hypertension     Medications:  Per chart review, no PTA anticoagulation   Assessment: 85yo male with PMH of CAD, CABG, complete heart block, atrial fibrillation, aortic stenosis, carotid artery stenosis, hypothyroid, DM 2, chronic microcytic anemia, HLD, chronic B12 deficiency.   Patient was previously on ppx heparin and plavix which have now been discontinued.   [place] leg positive for partially-occlusive left upper extremity DVT involving brachial, axillary, and subclavian veins.   Pharmacy has been consulted for apixaban dosing for LUE acute DVT, also h/o Afib.  Hgb 9.2>9.0 looks to be around patient's baseline. Plt 164>167  Goal of Therapy:  Monitor platelets by anticoagulation protocol: Yes   Plan:  Apixaban 10mg  BID x 7 days followed by 5mg  BID Monitor CBC per protocol  [name], PharmD, BCPS Clinical Pharmacist 02/02/2020 7:27 AM

## 2020-02-02 NOTE — Progress Notes (Addendum)
Physical Therapy Treatment Patient Details Name: Eric Kennedy MRN: [id number] DOB: 11-Oct-1934 Today's Date: 02/02/2020    History of Present Illness Patient is an 85 y.o. male presenting to the emergency room after being found down by patient's wife.  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  Patient has left dense hemiparesis.  Patient intubated and now extubated on 11/22. Acute hemorrhagic shock with severe anemia with hemoglobin of 4.7 initially. Patient has a known history of carotid stenosis, diabetes mellitus type 2, chronic anemia, complete heart block, HTN, atrial fibrillation, s/p CABG.     PT Comments    Pt received in bed with wife & son present for session. Pt presents with L inattention & PT educates family on need to position themselves on L side of pt to encourage scanning to that side of environment. Pt tolerates sitting EOB ~8 minutes with focus on midline orientation, static sitting balance & correcting posterior lean, and reaching outside of BOS with pt able to scan & locate PT's hand to L of midline but with difficulty uprighting trunk & returning to midline sitting. Pt is able to move LLE to assist with supine>sit and is able to elicit knee extensor movement when sitting EOB with PT holding L foot off of floor. Pt demonstrates impaired sustained attention & requires multimodal cuing throughout session. Updated d/c recommendations to CIR as per pt's family pt was very active (mowing, working around [employer], and golfing with game planned right before admission). Recommending CIR as pt would benefit from intensive therapies to focus on cognitive & physical deficits to increase independence with mobility.   Addendum: prior to tx MD cleared pt for participation in setting of slightly low K+ (3.2).  Pt also c/o feeling "woozy" initially sitting EOB, BP = 139/65 mmHg (RUE)    Follow Up Recommendations  CIR;Supervision/Assistance - 24 hour      Equipment Recommendations   (TBD in next venue)    Recommendations for Other Services Rehab consult     Precautions / Restrictions Precautions Precautions: Fall Precaution Comments: L hemi, L inattention Restrictions Weight Bearing Restrictions: No    Mobility  Bed Mobility Overal bed mobility: Needs Assistance Bed Mobility: Supine to Sit;Sit to Supine     Supine to sit: Max assist;HOB elevated Sit to supine: Total assist;HOB elevated   General bed mobility comments: Pt is able to move LLE to EOB with multimodal cuing, participates in uprighting trunk with multimodal cuing. Pt requires total assist +2 for scooting to [facility] (does attempt to place RUE/RLE on bed to assist but unable to initate/complete movement)  Transfers                    Ambulation/Gait                 Stairs             Wheelchair Mobility    Modified Rankin (Stroke Patients Only)       Balance Overall balance assessment: Needs assistance Sitting-balance support: Feet supported;Bilateral upper extremity supported Sitting balance-Leahy Scale: Poor Sitting balance - Comments: mod<>max assist for static sitting balance EOB, at times pt pushing L with RUE, posterior lean noted                                    Cognition Arousal/Alertness: Awake/alert Behavior During Therapy: Flat affect Overall Cognitive Status: Impaired/Different from  baseline Area of Impairment: Following commands;Safety/judgement;Orientation;Attention;Memory;Awareness;Problem solving                 Orientation Level: Disoriented to;Place;Time;Situation (reports he's in the hospital because he had a "fall")   Memory: Decreased recall of precautions;Decreased short-term memory Following Commands: Follows one step commands with increased time;Follows one step commands inconsistently Safety/Judgement: Decreased awareness of deficits;Decreased awareness of safety Awareness:  Intellectual Problem Solving: Slow processing;Decreased initiation;Requires verbal cues;Requires tactile cues General Comments: Pt initially joking with PT at beginning of session re: names of family members in room, but not oriented to location/situation but does recall wife & son's names      Exercises      General Comments        Pertinent Vitals/Pain Pain Assessment: Faces Faces Pain Scale: No hurt    Home Living                      Prior Function            PT Goals (current goals can now be found in the care plan section) Acute Rehab PT Goals Patient Stated Goal: none stated Time For Goal Achievement: 02/13/20 Potential to Achieve Goals: Fair Progress towards PT goals: Progressing toward goals    Frequency    7X/week      PT Plan Discharge plan needs to be updated    Co-evaluation              AM-PAC PT "6 Clicks" Mobility   Outcome Measure  Help needed turning from your back to your side while in a flat bed without using bedrails?: A Lot Help needed moving from lying on your back to sitting on the side of a flat bed without using bedrails?: A Lot Help needed moving to and from a bed to a chair (including a wheelchair)?: Total Help needed standing up from a chair using your arms (e.g., wheelchair or bedside chair)?: Total Help needed to walk in hospital room?: Total Help needed climbing 3-5 steps with a railing? : Total 6 Click Score: 8    End of Session   Activity Tolerance: Patient tolerated treatment well Patient left: in bed;with bed alarm set;with call bell/phone within reach;with family/visitor present; Addendum: with SCD's donned Nurse Communication: Mobility status PT Visit Diagnosis: Other abnormalities of gait and mobility (R26.89);Muscle weakness (generalized) (M62.81);Hemiplegia and hemiparesis;Difficulty in walking, not elsewhere classified (R26.2) Hemiplegia - Right/Left: Left Hemiplegia - dominant/non-dominant:  Non-dominant Hemiplegia - caused by: Cerebral infarction     Time: 4128-7867 PT Time Calculation (min) (ACUTE ONLY): 28 min  Charges:  $Therapeutic Activity: 8-22 mins $Neuromuscular Re-education: 8-22 mins                     Eric Kennedy, PT, DPT 02/03/20, 7:19 AM    Eric Kennedy 02/02/2020, 4:21 PM

## 2020-02-02 NOTE — Progress Notes (Signed)
PROGRESS NOTE   Eric Kennedy  BJY:[id number]    DOB: 18-Jul-1934    DOA: 01/26/2020  PCP: [name], MD   I have briefly reviewed patients previous medical records in [facility].  Chief Complaint  Patient presents with  . Fall    Brief Narrative:  85 year old male with PMH of CAD, CABG, complete heart block, atrial fibrillation, aortic stenosis, carotid artery stenosis, hypothyroid, DM 2, chronic microcytic anemia, HLD, chronic B12 deficiency, presented to the ED after being found down by his wife.  CT head revealed watershed infarcts in the right MCA/PCA distribution.  He has dense left hemiparesis.  He was noted to have severe anemia with hemoglobin of 4.7.  EGD 11/21 showed gastritis and gastric ulcer, placed on PPI twice daily.  He developed acute hypoxic respiratory failure, intubated for airway protection, extubated 11/22.  Dysphagia 1 diet started after MBS on 11/24. Left upper extremity venous Doppler to evaluate for LUE swelling confirmed DVT on 11/25 and Eliquis was initiated.  Palliative care medicine assessed for goals of care on 11/26, continue to monitor over the weekend and plan to meet with family on 11/29.   Assessment & Plan:  Active Problems:   Upper GI bleed   Acute hypoxemic respiratory failure (HCC)   Acute blood loss anemia   Acute right MCA stroke (HCC)   Cerebral ischemic stroke due to global hypoperfusion with watershed infarct [facility])   Failure to thrive (child)   Goals of care, counseling/discussion   Palliative care by specialist   DNR (do not resuscitate)   Acute hypoxic respiratory failure: Secondary to acute stroke and inability to protect airway.  Intubated and subsequently extubated 11/20. Resolved.  Acute blood loss anemia and hemorrhagic shock due to acute upper GI bleed: S/p EGD which showed gastric ulcer without bleeding and gastritis.  Hemoglobin stable in the 9 g range after transfusions.  Continue twice daily PPI.  No overt  bleeding reported. Monitor closely while Eliquis initiated.  Hemoglobin stable.  Acute right MCA stroke complicated by left hemiplegia, dysarthria and dysphagia Neurology consultation and follow-up appreciated.  Suspect CVA most likely hypoperfusion infarct secondary to anemia/hypotension.  Imaging did not show occlusion of complete carotid no atheroembolic or cardioembolic etiologies remain possible although they indicated that this would not substantially change management at this time. Neurology indicates that they are concerned that he may not regain much movement in his left arm and leg. Patient was on aspirin 81 mg daily and Plavix 75 mg daily. However since the patient developed left upper extremity DVT, I discussed with Neuro Hospitalist on call on 11/25 and he recommended okay with anticoagulation with a DOAC along with aspirin 81 mg daily and Plavix discontinued. Per therapy recommendations, SNF at discharge, TOC following.   Acute left upper extremity DVT Venous Doppler confirms partially occlusive left upper extremity DVT involving brachial, axillary and subclavian veins along with superficial thrombophlebitis involving basilic vein. As discussed above with neuro hospitalist on call, initiated Eliquis 11/25. Patient will remain on aspirin 81 mg daily for recent acute stroke but stopped Plavix. Obviously if patient develops bleeding complications, then will have to stop anticoagulation  Dysphagia: ST performed MBS and patient initiated on dysphagia 1 diet and thin liquids and they will reassess in 48 hours. Patient reportedly had an episode of nonbloody emesis yesterday morning. Also refusing to eat/poor oral intake. I discussed in detail with patient's son regarding patient's poor oral intake and need to address goals of care and  he is agreeable to a palliative care consult.  Poor oral intake, limited by patient's preferences where he likes some food and does not like others.  Palliative care  medicine discussed with son regarding Tira who is okay with temporary feeding tube.  However, will hold off on placing an NG tube given risk of bleeding from being on Eliquis and also EGD a few days ago showed NG tube associated trauma and some bleeding.  Monitor closely over the weekend, encourage oral intake and palliative care team to address goals on Monday 11/29  Hypernatremia Resolved after hypotonic IV fluids.  Remains at risk for recurrent dehydration and hypernatremia from poor oral intake.  Continue reduced IV D5 infusion.  Hypokalemia: Replace aggressively IV.  Magnesium normal.  Hypophosphatemia Replaced.  Acute kidney injury complicating stage IIIa CKD Creatinine 1.4 on 01/19/2019.  Presented with creatinine of 2.01 which peaked to 2.64.  Improved after IV fluids and down to 1.22 today.  Moderate aortic stenosis Noted on 2D echo.  Outpatient follow-up.  NSTEMI Secondary to acute stroke.  Paroxysmal atrial fibrillation Remains in sinus rhythm. Was not on anticoagulation candidate due to recent GI bleed. Now started anticoagulation for DVT, closely monitor for bleeding.  Adult failure to thrive: Palliative care consulted for goals of care  NSVT: Intermittent episodes of NSVT, asymptomatic.  Monitor on telemetry.  Attempt to keep potassium >4 and magnesium >2.  2D echo 11/20: LVEF 50-55%.  Initiated low-dose carvedilol 3.125 mg twice daily.   Body mass index is 25.68 kg/m.       DVT prophylaxis: SCDs Start: 01/30/20 1225.  None Eliquis since 11/25.   Code Status: DNR Family Communication: I discussed in detail with patient's son via phone on 11/25, updated care and answered all questions. Disposition:  Status is: Inpatient  Remains inpatient appropriate because:Inpatient level of care appropriate due to severity of illness   Dispo: The patient is from: Home              Anticipated d/c is to: SNF              Anticipated d/c date is: 3 days               Patient currently is not medically stable to d/c.        Consultants:   PCCM-signed off Neurology GI Palliative care medicine.  Procedures:   EGD 01/28/2020:  - Z-line irregular, 35 to 37 cm from the incisors. - 1 cm hiatal hernia. - Nasogastric tube trauma present in stomach. - Non-bleeding gastric ulcer with no stigmata of bleeding. - Gastritis. - Normal examined duodenum. - No specimens collected.  S/p extubation  Antimicrobials:    Anti-infectives (From admission, onward)   Start     Dose/Rate Route Frequency Ordered Stop   01/26/20 1845  cefTRIAXone (ROCEPHIN) 1 g in sodium chloride 0.9 % 100 mL IVPB  Status:  Discontinued        1 g 200 mL/hr over 30 Minutes Intravenous Every 24 hours 01/26/20 1831 01/29/20 1016        Subjective:  Seemed more alert this morning.  Irritated when I put on the lights in his room and wanted been turned down.  Oriented.  Self and place.  Denied complaints.  As per night nursing, patient took his medications last night.  Objective:   Vitals:   02/01/20 1954 02/01/20 2335 02/02/20 0450 02/02/20 0848  BP: (!) 144/55 (!) 155/52 (!) 126/46 (!) 139/58  Pulse: 77 78  69 73  Resp: _0 Temp: 98 F (36.7 C) 98.4 F (36.9 C) 98 F (36.7 C) 98 F (36.7 C)  TempSrc:      SpO2: 97% 95% 93% 95%  Weight:      Height:        General exam: Pleasant elderly male, small built and moderately nourished lying comfortably propped up in bed without distress.  Looks better today than he did yesterday. Respiratory system: Clear to auscultation. Respiratory effort normal. Cardiovascular system: S1 & S2 heard, RRR. No JVD, murmurs, rubs, gallops or clicks. No pedal edema.  Telemetry personally reviewed: AV paced rhythm/sinus rhythm/19 beat NSVT on 11/25 at 4:20 PM. Gastrointestinal system: Abdomen is nondistended, soft and nontender. No organomegaly or masses felt. Normal bowel sounds heard. Central nervous system: More alert today,  oriented x2, follows simple instructions.  Left facial droop.  Mild dysarthria.  No other cranial nerve deficits appreciated Extremities: Grade 5 x 5 power in the right limbs, grade 0 x 5 power in the left upper extremity and grade 2 x 5 power in the left lower extremity, unchanged.  Mild bruising of the medial aspect of the right elbow.  Also noted diffuse LUE swelling-no acute findings and vascular bundle intact. Skin: No rashes, lesions or ulcers Psychiatry: Judgement and insight impaired. Mood & affect flat.     Data Reviewed:   I have personally reviewed following labs and imaging studies   CBC: Recent Labs  Lab 01/31/20 0410 02/01/20 0405 02/02/20 0504  WBC 7.1 5.4 4.9  NEUTROABS 6.0 4.1 3.6  HGB 9.4* 9.2* 9.0*  HCT 31.9* 30.8* 30.8*  MCV 77.6* 77.8* 77.8*  PLT 185 164 073    Basic Metabolic Panel: Recent Labs  Lab 01/30/20 0426 01/30/20 0426 01/31/20 0410 02/01/20 0405 02/02/20 0504  NA 148*   < > 148* 146* 142  K 3.5   < > 3.0* 3.0* 3.2*  CL 118*   < > 117* 116* 112*  CO2 17*   < > 21* 21* 23  GLUCOSE 102*   < > 125* 116* 110*  BUN 36*   < > 30* 23 19  CREATININE 2.29*   < > 1.69* 1.46* 1.22  CALCIUM 9.2   < > 9.4 9.4 9.1  MG 2.3  --  2.3 2.2  --   PHOS 3.1   < > 2.0* 2.1* 2.7   < > = values in this interval not displayed.    Liver Function Tests: Recent Labs  Lab 01/26/20 1532  AST 57*  ALT 19  ALKPHOS 81  BILITOT 1.9*  PROT 6.2*  ALBUMIN 3.4*    CBG: Recent Labs  Lab 01/26/20 2053  GLUCAP 118*    Microbiology Studies:   Recent Results (from the past 240 hour(s))  Resp Panel by RT-PCR (Flu A&B, Covid) Nasopharyngeal Swab     Status: None   Collection Time: 01/26/20  6:29 PM   Specimen: Nasopharyngeal Swab; Nasopharyngeal(NP) swabs in vial transport medium  Result Value Ref Range Status   SARS Coronavirus 2 by RT PCR NEGATIVE NEGATIVE Final    Comment: (NOTE) SARS-CoV-2 target nucleic acids are NOT DETECTED.  The SARS-CoV-2 RNA is  generally detectable in upper respiratory specimens during the acute phase of infection. The lowest concentration of SARS-CoV-2 viral copies this assay can detect is 138 copies/mL. A negative result does not preclude SARS-Cov-2 infection and should not be used as the sole basis for treatment or other patient management  decisions. A negative result may occur with  improper specimen collection/handling, submission of specimen other than nasopharyngeal swab, presence of viral mutation(s) within the areas targeted by this assay, and inadequate number of viral copies(<138 copies/mL). A negative result must be combined with clinical observations, patient history, and epidemiological information. The expected result is Negative.  Fact Sheet for Patients:  EntrepreneurPulse.com.au  Fact Sheet for Healthcare Providers:  IncredibleEmployment.be  This test is no t yet approved or cleared by the [place] FDA and  has been authorized for detection and/or diagnosis of SARS-CoV-2 by FDA under an Emergency Use Authorization (EUA). This EUA will remain  in effect (meaning this test can be used) for the duration of the COVID-19 declaration under Section 564(b)(1) of the Act, 21 U.S.C.section 360bbb-3(b)(1), unless the authorization is terminated  or revoked sooner.       Influenza A by PCR NEGATIVE NEGATIVE Final   Influenza B by PCR NEGATIVE NEGATIVE Final    Comment: (NOTE) The Xpert Xpress SARS-CoV-2/FLU/RSV plus assay is intended as an aid in the diagnosis of influenza from Nasopharyngeal swab specimens and should not be used as a sole basis for treatment. Nasal washings and aspirates are unacceptable for Xpert Xpress SARS-CoV-2/FLU/RSV testing.  Fact Sheet for Patients: EntrepreneurPulse.com.au  Fact Sheet for Healthcare Providers: IncredibleEmployment.be  This test is not yet approved or cleared by the [place] FDA and has been authorized for detection and/or diagnosis of SARS-CoV-2 by FDA under an Emergency Use Authorization (EUA). This EUA will remain in effect (meaning this test can be used) for the duration of the COVID-19 declaration under Section 564(b)(1) of the Act, 21 U.S.C. section 360bbb-3(b)(1), unless the authorization is terminated or revoked.  Performed at [facility], [address] [place], [place] [address]   Culture, blood (routine x 2)     Status: None   Collection Time: 01/26/20  6:57 PM   Specimen: BLOOD  Result Value Ref Range Status   Specimen Description BLOOD LEFT ANTECUBITAL  Final   Special Requests   Final    BOTTLES DRAWN AEROBIC AND ANAEROBIC Blood Culture adequate volume   Culture   Final    NO GROWTH 5 DAYS Performed at [facility], [address], [place], [place] [address]    Report Status 01/31/2020 FINAL  Final  Culture, blood (routine x 2)     Status: None   Collection Time: 01/26/20  9:10 PM   Specimen: BLOOD  Result Value Ref Range Status   Specimen Description BLOOD BLOOD LEFT HAND  Final   Special Requests   Final    BOTTLES DRAWN AEROBIC AND ANAEROBIC Blood Culture adequate volume   Culture   Final    NO GROWTH 5 DAYS Performed at [facility], [address], [place], [place] [address]    Report Status 01/31/2020 FINAL  Final  MRSA PCR Screening     Status: None   Collection Time: 01/26/20  9:14 PM   Specimen: Nasopharyngeal  Result Value Ref Range Status   MRSA by PCR NEGATIVE NEGATIVE Final    Comment:        The GeneXpert MRSA Assay (FDA approved for NASAL specimens only), is one component of a comprehensive MRSA colonization surveillance program. It is not intended to diagnose MRSA infection nor to guide or monitor treatment for MRSA infections. Performed at [facility], [address], [place], [place] [address]      Radiology Studies:  US Venous Img Upper  Uni Left  (DVT)  Result Date: 02/01/2020 CLINICAL DATA:  Swelling x2 days EXAM: LEFT UPPER EXTREMITY VENOUS DOPPLER ULTRASOUND TECHNIQUE: Gray-scale sonography with graded compression, as well as color Doppler and duplex ultrasound were performed to evaluate the upper extremity deep venous system from the level of the subclavian vein and including the jugular, axillary, basilic, radial, ulnar and upper cephalic vein. Spectral Doppler was utilized to evaluate flow at rest and with distal augmentation maneuvers. COMPARISON:  None. FINDINGS: Contralateral Subclavian Vein: Respiratory phasicity is normal and symmetric with the symptomatic side. No evidence of thrombus. Normal compressibility. Internal Jugular Vein: No evidence of thrombus. Normal compressibility, respiratory phasicity and response to augmentation. Subclavian Vein: Hypoechoic partially occlusive thrombus in the peripheral aspect, with continued color Doppler flow signal through this segment. Axillary Vein: Eccentric thrombus resulting in incomplete compressibility, with some continued color Doppler flow signal. Cephalic Vein: No evidence of thrombus. Normal compressibility, respiratory phasicity and response to augmentation. Portions obscured due to overlying dressing. Basilic Vein: Hypoechoic thrombus resulting in incomplete segmental compressibility. Persistent antegrade color flow signal elsewhere. Brachial Veins: Partially occlusive thrombus Radial Veins: No evidence of thrombus. Normal compressibility, respiratory phasicity and response to augmentation. Ulnar Veins: No evidence of thrombus. Normal compressibility, respiratory phasicity and response to augmentation. Venous Reflux:  None visualized. Other Findings:  None visualized. IMPRESSION: 1. POSITIVE for partially-occlusive left upper extremity DVT involving brachial, axillary, and subclavian veins. 2. Superficial thrombophlebitis involving basilic vein. Electronically Signed   By: Lucrezia Europe M.D.    On: 02/01/2020 10:22     Scheduled Meds:   . apixaban  10 mg Oral BID   Followed by  . [START ON 02/08/2020] apixaban  5 mg Oral BID  . aspirin  81 mg Oral Daily  . chlorhexidine gluconate (MEDLINE KIT)  15 mL Mouth Rinse BID  . docusate  100 mg Oral BID  . pantoprazole  40 mg Intravenous Q12H  . polyethylene glycol  17 g Oral Daily  . sodium chloride flush  10-40 mL Intracatheter Q12H    Continuous Infusions:   . dextrose 5 % with KCl 20 mEq / L 20 mEq (02/02/20 0750)  . potassium chloride 10 mEq (02/02/20 1044)     LOS: 7 days     Vernell Leep, MD, [place], [facility]. [facility]    To contact the attending provider between 7A-7P or the covering provider during after hours 7P-7A, please log into the web site [email or web address] and access using universal [place] password for that web site. If you do not have the password, please call the hospital operator.  02/02/2020, 11:25 AM

## 2020-02-03 DIAGNOSIS — R1310 Dysphagia, unspecified: Secondary | ICD-10-CM | POA: Diagnosis not present

## 2020-02-03 DIAGNOSIS — I472 Ventricular tachycardia: Secondary | ICD-10-CM | POA: Diagnosis not present

## 2020-02-03 DIAGNOSIS — I82622 Acute embolism and thrombosis of deep veins of left upper extremity: Secondary | ICD-10-CM | POA: Diagnosis not present

## 2020-02-03 LAB — CBC WITH DIFFERENTIAL/PLATELET
Abs Immature Granulocytes: 0.02 10*3/uL (ref 0.00–0.07)
Basophils Absolute: 0 10*3/uL (ref 0.0–0.1)
Basophils Relative: 0 %
Eosinophils Absolute: 0.3 10*3/uL (ref 0.0–0.5)
Eosinophils Relative: 5 %
HCT: 30.6 % — ABNORMAL LOW (ref 39.0–52.0)
Hemoglobin: 9.1 g/dL — ABNORMAL LOW (ref 13.0–17.0)
Immature Granulocytes: 0 %
Lymphocytes Relative: 17 %
Lymphs Abs: 0.8 10*3/uL (ref 0.7–4.0)
MCH: 23.2 pg — ABNORMAL LOW (ref 26.0–34.0)
MCHC: 29.7 g/dL — ABNORMAL LOW (ref 30.0–36.0)
MCV: 78.1 fL — ABNORMAL LOW (ref 80.0–100.0)
Monocytes Absolute: 0.3 10*3/uL (ref 0.1–1.0)
Monocytes Relative: 6 %
Neutro Abs: 3.5 10*3/uL (ref 1.7–7.7)
Neutrophils Relative %: 72 %
Platelets: 173 10*3/uL (ref 150–400)
RBC: 3.92 MIL/uL — ABNORMAL LOW (ref 4.22–5.81)
RDW: 23.9 % — ABNORMAL HIGH (ref 11.5–15.5)
WBC: 4.9 10*3/uL (ref 4.0–10.5)
nRBC: 0 % (ref 0.0–0.2)

## 2020-02-03 LAB — BASIC METABOLIC PANEL
Anion gap: 7 (ref 5–15)
BUN: 17 mg/dL (ref 8–23)
CO2: 22 mmol/L (ref 22–32)
Calcium: 8.8 mg/dL — ABNORMAL LOW (ref 8.9–10.3)
Chloride: 110 mmol/L (ref 98–111)
Creatinine, Ser: 1.11 mg/dL (ref 0.61–1.24)
GFR, Estimated: >60 mL/min (ref >60)
Glucose, Bld: 121 mg/dL — ABNORMAL HIGH (ref 70–99)
Potassium: 3.7 mmol/L (ref 3.5–5.1)
Sodium: 139 mmol/L (ref 135–145)

## 2020-02-03 LAB — PHOSPHORUS: Phosphorus: 2.7 mg/dL (ref 2.5–4.6)

## 2020-02-03 MED ORDER — POTASSIUM CHLORIDE CRYS ER 20 MEQ PO TBCR
40.0000 meq | EXTENDED_RELEASE_TABLET | Freq: Once | ORAL | Status: AC
  Administered 2020-02-03: 09:00:00 40 meq via ORAL
  Filled 2020-02-03: qty 2

## 2020-02-03 MED ORDER — CARVEDILOL 6.25 MG PO TABS
6.2500 mg | ORAL_TABLET | Freq: Two times a day (BID) | ORAL | Status: DC
  Administered 2020-02-03 – 2020-02-06 (×6): 6.25 mg via ORAL
  Filled 2020-02-03 (×6): qty 1

## 2020-02-03 NOTE — Consult Note (Signed)
PHARMACY CONSULT NOTE  Pharmacy Consult for Electrolyte Monitoring and Replacement   Recent Labs: Potassium (mmol/L)  Date Value  02/03/2020 3.7   Magnesium (mg/dL)  Date Value  02/01/2020 2.2   Calcium (mg/dL)  Date Value  02/03/2020 8.8 (L)   Albumin (g/dL)  Date Value  01/26/2020 3.4 (L)   Phosphorus (mg/dL)  Date Value  02/03/2020 2.7   Sodium (mmol/L)  Date Value  02/03/2020 139   Corrected Ca: 10 mg/dL  Assessment: 85 year old male with PMH of CAD, CABG, complete heart block, atrial fibrillation, aortic stenosis, carotid artery stenosis, hypothyroid, DM 2, chronic microcytic anemia, HLD, chronic B12 deficiency with CVA and mechanical fall. Pharmacy was consulted to replenish and monitor electrolytes  Goal of Therapy:  Potassium 4.0 - 5.1 mmol/L Magnesium 2.0 - 2.4 mg/dL All Other Electrolytes wnl  Plan:   40 mEq oral KCl x 1 per attending  Re-check electrolytes with am labs 11/28   Dallie Piles ,PharmD Clinical Pharmacist 02/03/2020 10:26 AM

## 2020-02-03 NOTE — Progress Notes (Addendum)
PROGRESS NOTE   Eric Kennedy  GMW:[id number]    DOB: [birth year]    DOA: 01/26/2020  PCP: [name], MD   I have briefly reviewed patients previous medical records in [facility].  Chief Complaint  Patient presents with  . Fall    Brief Narrative:  85 year old male with PMH of CAD, CABG, complete heart block, atrial fibrillation, aortic stenosis, carotid artery stenosis, hypothyroid, DM 2, chronic microcytic anemia, HLD, chronic B12 deficiency, presented to the ED after being found down by his wife.  CT head revealed watershed infarcts in the right MCA/PCA distribution.  He has dense left hemiparesis.  He was noted to have severe anemia with hemoglobin of 4.7.  EGD 11/21 showed gastritis and gastric ulcer, placed on PPI twice daily.  He developed acute hypoxic respiratory failure, intubated for airway protection, extubated 11/22.  Dysphagia 1 diet started after MBS on 11/24. Left upper extremity venous Doppler to evaluate for LUE swelling confirmed DVT on 11/25 and Eliquis was initiated.  Palliative care medicine assessed for goals of care on 11/26, continue to monitor over the weekend and plan to meet with family on 11/29.  Oral intake seems to be improving   Assessment & Plan:  Active Problems:   Upper GI bleed   Acute hypoxemic respiratory failure (HCC)   Acute blood loss anemia   Acute right MCA stroke (HCC)   Cerebral ischemic stroke due to global hypoperfusion with watershed infarct [facility])   Failure to thrive (child)   Goals of care, counseling/discussion   Palliative care by specialist   DNR (do not resuscitate)   Acute hypoxic respiratory failure: Secondary to acute stroke and inability to protect airway.  Intubated and subsequently extubated 11/20. Resolved.  Acute blood loss anemia and hemorrhagic shock due to acute upper GI bleed: S/p EGD which showed gastric ulcer without bleeding and gastritis.  Hemoglobin stable in the 9 g range after transfusions.   Continue twice daily PPI.  No overt bleeding reported. Monitor closely while Eliquis initiated.  Hemoglobin stable in the 9 g range for several days.  Acute right MCA stroke complicated by left hemiplegia, dysarthria and dysphagia Neurology consultation and follow-up appreciated.  Suspect CVA most likely hypoperfusion infarct secondary to anemia/hypotension.  Imaging did not show occlusion of complete carotid no atheroembolic or cardioembolic etiologies remain possible although they indicated that this would not substantially change management at this time. Neurology indicates that they are concerned that he may not regain much movement in his left arm and leg. Patient was on aspirin 81 mg daily and Plavix 75 mg daily. However since the patient developed left upper extremity DVT, I discussed with Neuro Hospitalist on call on 11/25 and he recommended okay with anticoagulation with a DOAC along with aspirin 81 mg daily and Plavix discontinued. Per therapy recommendations, now recommend CIR at discharge-consult ordered.  Acute left upper extremity DVT Venous Doppler confirms partially occlusive left upper extremity DVT involving brachial, axillary and subclavian veins along with superficial thrombophlebitis involving basilic vein. As discussed above with neuro hospitalist on call, initiated Eliquis 11/25. Patient will remain on aspirin 81 mg daily for recent acute stroke but stopped Plavix. Obviously if patient develops bleeding complications, then will have to stop anticoagulation.  Tolerating for now.  Dysphagia: ST performed MBS and patient initiated on dysphagia 1 diet and thin liquids and they will reassess in 48 hours. Patient reportedly had an episode of nonbloody emesis yesterday morning. Also refusing to eat/poor oral intake. I  discussed in detail with patient's son regarding patient's poor oral intake and need to address goals of care and he agreed e to a palliative care consult.  Poor oral intake,  limited by patient's preferences where he likes some food and does not like others.  Palliative care medicine discussed with son regarding [place] who is okay with temporary feeding tube.  However, will hold off on placing an NG tube given risk of bleeding from being on Eliquis and also EGD a few days ago showed NG tube associated trauma and some bleeding.  Monitor closely over the weekend, encourage oral intake and palliative care team to address goals on Monday 11/29.  As per discussion with RN, patient is picky about his food choices but oral intake seems to be improving.  As per ST, ordering Magic cups and Ensure protein max to come with all meals preferably in chocolate and strawberry flavors.  He is a total assist/needs to be fed.  When he is given foods that he does not like, he spits it up.  Hypernatremia Resolved after hypotonic IV fluids.  Remains at risk for recurrent dehydration and hypernatremia from poor oral intake.  Discontinued IVF.  Hypokalemia: Improved.  Potassium 3.7.  Replace and follow.  Magnesium 2.2.  Hypophosphatemia Replaced.  Acute kidney injury complicating stage IIIa CKD Creatinine 1.4 on 01/19/2019.  Presented with creatinine of 2.01 which peaked to 2.64.  Resolved.  IVF discontinued.  Moderate aortic stenosis Noted on 2D echo.  Outpatient follow-up.  NSTEMI Secondary to acute stroke.  Paroxysmal atrial fibrillation Remains in sinus rhythm. Was not on anticoagulation candidate due to recent GI bleed. Now started anticoagulation for DVT, closely monitor for bleeding.  Adult failure to thrive: Palliative care consulted for goals of care  NSVT: Intermittent episodes of NSVT, asymptomatic.  Monitor on telemetry.  Attempt to keep potassium >4 and magnesium >2.  2D echo 11/20: LVEF 50-55%.  Initiated low-dose carvedilol 3.125 mg twice daily and titrate up as tolerated.   Body mass index is 25.68 kg/m.       DVT prophylaxis: SCDs Start: 01/30/20 1225.  Now on  Eliquis since 11/25.   Code Status: DNR Family Communication: I discussed in detail with patient's son via phone on 11/27, updated care and answered all questions.  None at bedside today. Disposition:  Status is: Inpatient  Remains inpatient appropriate because:Inpatient level of care appropriate due to severity of illness   Dispo: The patient is from: Home              Anticipated d/c is to: CIR              Anticipated d/c date is: 3 days              Patient currently is not medically stable to d/c.        Consultants:   PCCM-signed off Neurology GI Palliative care medicine.  Procedures:   EGD 01/28/2020:  - Z-line irregular, 35 to 37 cm from the incisors. - 1 cm hiatal hernia. - Nasogastric tube trauma present in stomach. - Non-bleeding gastric ulcer with no stigmata of bleeding. - Gastritis. - Normal examined duodenum. - No specimens collected.  S/p extubation  Antimicrobials:    Anti-infectives (From admission, onward)   Start     Dose/Rate Route Frequency Ordered Stop   01/26/20 1845  cefTRIAXone (ROCEPHIN) 1 g in sodium chloride 0.9 % 100 mL IVPB  Status:  Discontinued        1  g 200 mL/hr over 30 Minutes Intravenous Every 24 hours 01/26/20 1831 01/29/20 1016        Subjective:  Much more alert and interactive this morning.  Complaining that "they are starving me".  Discussed in detail with RN, reports improved oral intake but patient is picky about what he wants and not.  No acute issues reported.  Objective:   Vitals:   02/03/20 0005 02/03/20 0431 02/03/20 0734 02/03/20 1137  BP: (!) 158/68 133/80 (!) 141/61 (!) 149/58  Pulse: 72 62 73 62  Resp: '16 18 16 18  ' Temp: 98.4 F (36.9 C) 98.6 F (37 C) 98.5 F (36.9 C) 98 F (36.7 C)  TempSrc: Oral  Oral   SpO2: 97% 94% 93% 96%  Weight:      Height:        General exam: Pleasant elderly male, small built and moderately nourished lying comfortably propped up in bed without distress.  Continues  to look better day after day. Respiratory system: Clear to auscultation. Respiratory effort normal. Cardiovascular system: S1 & S2 heard, RRR. No JVD, murmurs, rubs, gallops or clicks. No pedal edema.  Telemetry personally reviewed: Alternates between a paced rhythm/sinus rhythm.  7-8 beats NSVT noted on 11/26. Gastrointestinal system: Abdomen is nondistended, soft and nontender. No organomegaly or masses felt. Normal bowel sounds heard. Central nervous system: Remains alert, oriented x2, follows simple instructions.  Left facial droop.  Mild dysarthria.  No other cranial nerve deficits appreciated Extremities: Grade 5 x 5 power in the right limbs, grade 0 x 5 power in the left upper extremity and grade 2 x 5 power in the left lower extremity, unchanged.  Mild bruising of the medial aspect of the right elbow.  Left upper extremity swelling slowly improving. Skin: No rashes, lesions or ulcers Psychiatry: Judgement and insight impaired. Mood & affect pleasant and smiling.    Data Reviewed:   I have personally reviewed following labs and imaging studies   CBC: Recent Labs  Lab 02/01/20 0405 02/02/20 0504 02/03/20 0327  WBC 5.4 4.9 4.9  NEUTROABS 4.1 3.6 3.5  HGB 9.2* 9.0* 9.1*  HCT 30.8* 30.8* 30.6*  MCV 77.8* 77.8* 78.1*  PLT 164 167 546    Basic Metabolic Panel: Recent Labs  Lab 01/30/20 0426 01/30/20 0426 01/31/20 0410 01/31/20 0410 02/01/20 0405 02/02/20 0504 02/03/20 0327  NA 148*   < > 148*   < > 146* 142 139  K 3.5   < > 3.0*   < > 3.0* 3.2* 3.7  CL 118*   < > 117*   < > 116* 112* 110  CO2 17*   < > 21*   < > 21* 23 22  GLUCOSE 102*   < > 125*   < > 116* 110* 121*  BUN 36*   < > 30*   < > '23 19 17  ' CREATININE 2.29*   < > 1.69*   < > 1.46* 1.22 1.11  CALCIUM 9.2   < > 9.4   < > 9.4 9.1 8.8*  MG 2.3  --  2.3  --  2.2  --   --   PHOS 3.1   < > 2.0*   < > 2.1* 2.7 2.7   < > = values in this interval not displayed.    Liver Function Tests: No results for input(s):  AST, ALT, ALKPHOS, BILITOT, PROT, ALBUMIN in the last 168 hours.  CBG: No results for input(s): GLUCAP in the last 168 hours.  Microbiology Studies:   Recent Results (from the past 240 hour(s))  Resp Panel by RT-PCR (Flu A&B, Covid) Nasopharyngeal Swab     Status: None   Collection Time: 01/26/20  6:29 PM   Specimen: Nasopharyngeal Swab; Nasopharyngeal(NP) swabs in vial transport medium  Result Value Ref Range Status   SARS Coronavirus 2 by RT PCR NEGATIVE NEGATIVE Final    Comment: (NOTE) SARS-CoV-2 target nucleic acids are NOT DETECTED.  The SARS-CoV-2 RNA is generally detectable in upper respiratory specimens during the acute phase of infection. The lowest concentration of SARS-CoV-2 viral copies this assay can detect is 138 copies/mL. A negative result does not preclude SARS-Cov-2 infection and should not be used as the sole basis for treatment or other patient management decisions. A negative result may occur with  improper specimen collection/handling, submission of specimen other than nasopharyngeal swab, presence of viral mutation(s) within the areas targeted by this assay, and inadequate number of viral copies(<138 copies/mL). A negative result must be combined with clinical observations, patient history, and epidemiological information. The expected result is Negative.  Fact Sheet for Patients:  EntrepreneurPulse.com.au  Fact Sheet for Healthcare Providers:  IncredibleEmployment.be  This test is no t yet approved or cleared by the [place] FDA and  has been authorized for detection and/or diagnosis of SARS-CoV-2 by FDA under an Emergency Use Authorization (EUA). This EUA will remain  in effect (meaning this test can be used) for the duration of the COVID-19 declaration under Section 564(b)(1) of the Act, 21 U.S.C.section 360bbb-3(b)(1), unless the authorization is terminated  or revoked sooner.       Influenza A by PCR  NEGATIVE NEGATIVE Final   Influenza B by PCR NEGATIVE NEGATIVE Final    Comment: (NOTE) The Xpert Xpress SARS-CoV-2/FLU/RSV plus assay is intended as an aid in the diagnosis of influenza from Nasopharyngeal swab specimens and should not be used as a sole basis for treatment. Nasal washings and aspirates are unacceptable for Xpert Xpress SARS-CoV-2/FLU/RSV testing.  Fact Sheet for Patients: EntrepreneurPulse.com.au  Fact Sheet for Healthcare Providers: IncredibleEmployment.be  This test is not yet approved or cleared by the [place] FDA and has been authorized for detection and/or diagnosis of SARS-CoV-2 by FDA under an Emergency Use Authorization (EUA). This EUA will remain in effect (meaning this test can be used) for the duration of the COVID-19 declaration under Section 564(b)(1) of the Act, 21 U.S.C. section 360bbb-3(b)(1), unless the authorization is terminated or revoked.  Performed at [facility], [address] [place], [place] [address]   Culture, blood (routine x 2)     Status: None   Collection Time: 01/26/20  6:57 PM   Specimen: BLOOD  Result Value Ref Range Status   Specimen Description BLOOD LEFT ANTECUBITAL  Final   Special Requests   Final    BOTTLES DRAWN AEROBIC AND ANAEROBIC Blood Culture adequate volume   Culture   Final    NO GROWTH 5 DAYS Performed at [facility], [address] [place], [place] [address]    Report Status 01/31/2020 FINAL  Final  Culture, blood (routine x 2)     Status: None   Collection Time: 01/26/20  9:10 PM   Specimen: BLOOD  Result Value Ref Range Status   Specimen Description BLOOD BLOOD LEFT HAND  Final   Special Requests   Final    BOTTLES DRAWN AEROBIC AND ANAEROBIC Blood Culture adequate volume   Culture   Final    NO GROWTH 5 DAYS  Performed at [facility], [address] [place], [place] [address]    Report Status 01/31/2020 FINAL  Final  MRSA  PCR Screening     Status: None   Collection Time: 01/26/20  9:14 PM   Specimen: Nasopharyngeal  Result Value Ref Range Status   MRSA by PCR NEGATIVE NEGATIVE Final    Comment:        The GeneXpert MRSA Assay (FDA approved for NASAL specimens only), is one component of a comprehensive MRSA colonization surveillance program. It is not intended to diagnose MRSA infection nor to guide or monitor treatment for MRSA infections. Performed at [facility], [address], [place], [place] [address]      Radiology Studies:  No results found.   Scheduled Meds:   . apixaban  10 mg Oral BID   Followed by  . [START ON 02/08/2020] apixaban  5 mg Oral BID  . aspirin  81 mg Oral Daily  . carvedilol  3.125 mg Oral BID WC  . chlorhexidine gluconate (MEDLINE KIT)  15 mL Mouth Rinse BID  . docusate  100 mg Oral BID  . pantoprazole  40 mg Oral BID AC  . polyethylene glycol  17 g Oral Daily  . sodium chloride flush  10-40 mL Intracatheter Q12H    Continuous Infusions:      LOS: 8 days     Vernell Leep, MD, [place], [facility]. [facility]    To contact the attending provider between 7A-7P or the covering provider during after hours 7P-7A, please log into the web site [email or web address] and access using universal  password for that web site. If you do not have the password, please call the hospital operator.  02/03/2020, 11:42 AM

## 2020-02-03 NOTE — Progress Notes (Addendum)
Inpatient Rehab Admissions Coordinator:   Met with patient at bedside to discuss potential CIR admission. Pt. Stated interest but family has not yet worked out home d/c plan. Son to look into Pt.'s LTC insurance to see if it covers in home care. . Will pursue for potential admit next week, pending bed availability if family can arrange 24/7 support at discharge.   Clemens Catholic, [place], [place] Admissions Coordinator  [phone] ([place]) [phone] (office)

## 2020-02-03 NOTE — Progress Notes (Signed)
Physical Therapy Treatment Patient Details Name: Eric Kennedy MRN: [id number] DOB: January 08, 1935 Today's Date: 02/03/2020    History of Present Illness Patient is an 85 y.o. male presenting to the emergency room after being found down by patient's wife.  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  Patient has left dense hemiparesis.  Patient intubated and now extubated on 11/22. Acute hemorrhagic shock with severe anemia with hemoglobin of 4.7 initially. Patient has a known history of carotid stenosis, diabetes mellitus type 2, chronic anemia, complete heart block, HTN, atrial fibrillation, s/p CABG.     PT Comments    Pt received asleep in bed but awakened & participated in PT treatment. Pt requires total assist for supine<>sit on this date. Pt tolerated sitting EOB ~8 minutes with max/total assist while addressing midline orientation, scanning to L of midline, L attention, upright posture, and activity tolerance. Pt requires max cuing but is able to scan to L of midline, reaching for object to L of midline with RUE. Upon questioning pt is able to recognize when he is falling over but does not initiate correcting it even with MAX cuing. Pt is able to perform ~4 repetitions of LLE LAQ with PT elevating L foot off of floor. Pt reports feeling "terrible" but unable to elaborate and denies pain. Nurse reports pt had a busy morning (bed mobility for linen change, bath). PT left lights & TV on in room in hopes of stimulus increasing pt's alertness; anticipate pt will be better able to participate when more alert. Will continue to follow PT acutely to address deficits noted.     Follow Up Recommendations  CIR;Supervision/Assistance - 24 hour     Equipment Recommendations   (TBD in next venue)    Recommendations for Other Services Rehab consult     Precautions / Restrictions Precautions Precautions: Fall Precaution Comments: L hemi, L inattention Restrictions Weight  Bearing Restrictions: No    Mobility  Bed Mobility Overal bed mobility: Needs Assistance Bed Mobility: Supine to Sit;Sit to Supine     Supine to sit: Total assist;HOB elevated Sit to supine: Total assist;HOB elevated   General bed mobility comments: +2 total assist for scooting to [facility]  Transfers                    Ambulation/Gait                 Stairs             Wheelchair Mobility    Modified Rankin (Stroke Patients Only)       Balance Overall balance assessment: Needs assistance Sitting-balance support: Feet supported (RUE support) Sitting balance-Leahy Scale: Zero Sitting balance - Comments: max/total assist sitting EOB Postural control: Posterior lean;Left lateral lean                                  Cognition Arousal/Alertness: Lethargic Behavior During Therapy: Flat affect Overall Cognitive Status: Impaired/Different from baseline Area of Impairment: Following commands;Safety/judgement;Orientation;Attention;Memory;Awareness;Problem solving                 Orientation Level:  (pt is able to report he is in the hospital in [place] on this date)   Memory: Decreased recall of precautions;Decreased short-term memory Following Commands: Follows one step commands with increased time;Follows one step commands inconsistently Safety/Judgement: Decreased awareness of deficits;Decreased awareness of safety Awareness: Intellectual Problem Solving: Slow processing;Decreased initiation;Requires  tactile cues;Difficulty sequencing;Requires verbal cues        Exercises      General Comments        Pertinent Vitals/Pain Pain Assessment: No/denies pain    Home Living                      Prior Function            PT Goals (current goals can now be found in the care plan section) Acute Rehab PT Goals Patient Stated Goal: none stated PT Goal Formulation: Patient unable to participate in goal setting Time  For Goal Achievement: 02/13/20 Potential to Achieve Goals: Fair Progress towards PT goals: Progressing toward goals    Frequency    7X/week      PT Plan Current plan remains appropriate    Co-evaluation              AM-PAC PT "6 Clicks" Mobility   Outcome Measure  Help needed turning from your back to your side while in a flat bed without using bedrails?: Total Help needed moving from lying on your back to sitting on the side of a flat bed without using bedrails?: Total Help needed moving to and from a bed to a chair (including a wheelchair)?: Total Help needed standing up from a chair using your arms (e.g., wheelchair or bedside chair)?: Total Help needed to walk in hospital room?: Total Help needed climbing 3-5 steps with a railing? : Total 6 Click Score: 6    End of Session   Activity Tolerance: Patient tolerated treatment well;Patient limited by fatigue Patient left: in bed;with bed alarm set;with call bell/phone within reach;with SCD's reapplied Nurse Communication: Mobility status PT Visit Diagnosis: Other abnormalities of gait and mobility (R26.89);Muscle weakness (generalized) (M62.81);Hemiplegia and hemiparesis;Difficulty in walking, not elsewhere classified (R26.2) Hemiplegia - Right/Left: Left Hemiplegia - dominant/non-dominant: Non-dominant Hemiplegia - caused by: Cerebral infarction     Time: 1111-1135 PT Time Calculation (min) (ACUTE ONLY): 24 min  Charges:  $Therapeutic Activity: 8-22 mins $Neuromuscular Re-education: 8-22 mins                     Lavone Nian, PT, DPT 02/03/20, 1:09 PM    Waunita Schooner 02/03/2020, 1:04 PM

## 2020-02-04 DIAGNOSIS — I472 Ventricular tachycardia: Secondary | ICD-10-CM | POA: Diagnosis not present

## 2020-02-04 DIAGNOSIS — I82622 Acute embolism and thrombosis of deep veins of left upper extremity: Secondary | ICD-10-CM | POA: Diagnosis not present

## 2020-02-04 DIAGNOSIS — R1310 Dysphagia, unspecified: Secondary | ICD-10-CM | POA: Diagnosis not present

## 2020-02-04 LAB — BASIC METABOLIC PANEL
Anion gap: 7 (ref 5–15)
BUN: 19 mg/dL (ref 8–23)
CO2: 22 mmol/L (ref 22–32)
Calcium: 8.9 mg/dL (ref 8.9–10.3)
Chloride: 110 mmol/L (ref 98–111)
Creatinine, Ser: 1.29 mg/dL — ABNORMAL HIGH (ref 0.61–1.24)
GFR, Estimated: 55 mL/min — ABNORMAL LOW (ref >60)
Glucose, Bld: 131 mg/dL — ABNORMAL HIGH (ref 70–99)
Potassium: 3.9 mmol/L (ref 3.5–5.1)
Sodium: 139 mmol/L (ref 135–145)

## 2020-02-04 LAB — MAGNESIUM: Magnesium: 2.1 mg/dL (ref 1.7–2.4)

## 2020-02-04 LAB — PHOSPHORUS: Phosphorus: 2.7 mg/dL (ref 2.5–4.6)

## 2020-02-04 MED ORDER — POTASSIUM CHLORIDE CRYS ER 20 MEQ PO TBCR
20.0000 meq | EXTENDED_RELEASE_TABLET | Freq: Once | ORAL | Status: AC
  Administered 2020-02-04: 10:00:00 20 meq via ORAL
  Filled 2020-02-04: qty 1

## 2020-02-04 NOTE — Consult Note (Signed)
PHARMACY CONSULT NOTE  Pharmacy Consult for Electrolyte Monitoring and Replacement   Recent Labs: Potassium (mmol/L)  Date Value  02/04/2020 3.9   Magnesium (mg/dL)  Date Value  02/04/2020 2.1   Calcium (mg/dL)  Date Value  02/04/2020 8.9   Albumin (g/dL)  Date Value  01/26/2020 3.4 (L)   Phosphorus (mg/dL)  Date Value  02/04/2020 2.7   Sodium (mmol/L)  Date Value  02/04/2020 139    Assessment: 85 year old male with PMH of CAD, CABG, complete heart block, atrial fibrillation, aortic stenosis, carotid artery stenosis, hypothyroid, DM 2, chronic microcytic anemia, HLD, chronic B12 deficiency with CVA and mechanical fall. Pharmacy was consulted to replenish and monitor electrolytes  Goal of Therapy:  Potassium 4.0 - 5.1 mmol/L Magnesium 2.0 - 2.4 mg/dL All Other Electrolytes wnl  Plan:   20 mEq oral KCl x 1 per attending  Re-check electrolytes with am labs 11/29   Dallie Piles ,PharmD Clinical Pharmacist 02/04/2020 10:51 AM

## 2020-02-04 NOTE — Progress Notes (Signed)
Patient had an 8 beat run of v-tach per CCMD, hospitalist notified, no new orders at this time,  patient asymptomatic, VSS, will continue to monitor.

## 2020-02-04 NOTE — Progress Notes (Signed)
PT Cancellation Note  Patient Details Name: [name] MRN: [id number] DOB: Nov 28, 1934   Cancelled Treatment:    Reason Eval/Treat Not Completed: Fatigue/lethargy limiting ability to participate   Attempted session x 3 this am.  Eating with assist of son on first attempt and then on later attempts pt asleep and did not awaken.  Son in stating he seemed to be more tired today and sleeping more.  Will continue tomorrow as appropriate.   Chesley Noon 02/04/2020, 11:20 AM

## 2020-02-04 NOTE — TOC Progression Note (Addendum)
Transition of Care [facility]) - Progression Note    Patient Details  Name: Eric Kennedy MRN: [id number] Date of Birth: 10-01-1934  [facility] CM/SW Contact  Meriel Flavors, [place] Phone Number: 02/04/2020, 12:23 PM  Clinical Narrative:    According to CSW notes, discharge plan has been SNF. CSW saw in attending's note from yesterday, he has recommended patient for CIR, with Clemens Catholic. Mickel Baas has assessed patient: see her note      Expected Discharge Plan: [place] Barriers to Discharge: Continued Medical Work up  Expected Discharge Plan and Services Expected Discharge Plan: [place] In-house Referral: Clinical Social Work   Post Acute Care Choice: [place] arrangements for the past 2 months: Single Family Home                                       Social Determinants of Health (SDOH) Interventions    Readmission Risk Interventions No flowsheet data found.

## 2020-02-04 NOTE — Plan of Care (Signed)
Problem: Education: Goal: Knowledge of General Education information will improve Description: Including pain rating scale, medication(s)/side effects and non-pharmacologic comfort measures 02/04/2020 1832 by Cristela Blue, RN Outcome: Progressing 02/04/2020 1832 by Cristela Blue, RN Outcome: Progressing   Problem: Health Behavior/Discharge Planning: Goal: Ability to manage health-related needs will improve 02/04/2020 1832 by Cristela Blue, RN Outcome: Progressing 02/04/2020 1832 by Cristela Blue, RN Outcome: Progressing   Problem: Clinical Measurements: Goal: Ability to maintain clinical measurements within normal limits will improve 02/04/2020 1832 by Cristela Blue, RN Outcome: Progressing 02/04/2020 1832 by Cristela Blue, RN Outcome: Progressing Goal: Will remain free from infection 02/04/2020 1832 by Cristela Blue, RN Outcome: Progressing 02/04/2020 1832 by Cristela Blue, RN Outcome: Progressing Goal: Diagnostic test results will improve 02/04/2020 1832 by Cristela Blue, RN Outcome: Progressing 02/04/2020 1832 by Cristela Blue, RN Outcome: Progressing Goal: Respiratory complications will improve 02/04/2020 1832 by Cristela Blue, RN Outcome: Progressing 02/04/2020 1832 by Cristela Blue, RN Outcome: Progressing Goal: Cardiovascular complication will be avoided 02/04/2020 1832 by Cristela Blue, RN Outcome: Progressing 02/04/2020 1832 by Cristela Blue, RN Outcome: Progressing   Problem: Activity: Goal: Risk for activity intolerance will decrease 02/04/2020 1832 by Cristela Blue, RN Outcome: Progressing 02/04/2020 1832 by Cristela Blue, RN Outcome: Progressing   Problem: Nutrition: Goal: Adequate nutrition will be maintained 02/04/2020 1832 by Cristela Blue, RN Outcome: Progressing 02/04/2020 1832 by Cristela Blue, RN Outcome: Progressing   Problem: Elimination: Goal: Will not experience complications related to bowel motility 02/04/2020  1832 by Cristela Blue, RN Outcome: Progressing 02/04/2020 1832 by Cristela Blue, RN Outcome: Progressing Goal: Will not experience complications related to urinary retention 02/04/2020 1832 by Cristela Blue, RN Outcome: Progressing 02/04/2020 1832 by Cristela Blue, RN Outcome: Progressing   Problem: Pain Managment: Goal: General experience of comfort will improve 02/04/2020 1832 by Cristela Blue, RN Outcome: Progressing 02/04/2020 1832 by Cristela Blue, RN Outcome: Progressing   Problem: Safety: Goal: Ability to remain free from injury will improve 02/04/2020 1832 by Cristela Blue, RN Outcome: Progressing 02/04/2020 1832 by Cristela Blue, RN Outcome: Progressing   Problem: Skin Integrity: Goal: Risk for impaired skin integrity will decrease 02/04/2020 1832 by Cristela Blue, RN Outcome: Progressing 02/04/2020 1832 by Cristela Blue, RN Outcome: Progressing   Problem: Education: Goal: Knowledge of disease or condition will improve 02/04/2020 1832 by Cristela Blue, RN Outcome: Progressing 02/04/2020 1832 by Cristela Blue, RN Outcome: Progressing Goal: Knowledge of secondary prevention will improve 02/04/2020 1832 by Cristela Blue, RN Outcome: Progressing 02/04/2020 1832 by Cristela Blue, RN Outcome: Progressing Goal: Knowledge of patient specific risk factors addressed and post discharge goals established will improve 02/04/2020 1832 by Cristela Blue, RN Outcome: Progressing 02/04/2020 1832 by Cristela Blue, RN Outcome: Progressing   Problem: Coping: Goal: Will identify appropriate support needs 02/04/2020 1832 by Cristela Blue, RN Outcome: Progressing 02/04/2020 1832 by Cristela Blue, RN Outcome: Progressing   Problem: Health Behavior/Discharge Planning: Goal: Ability to manage health-related needs will improve 02/04/2020 1832 by Cristela Blue, RN Outcome: Progressing 02/04/2020 1832 by Cristela Blue, RN Outcome: Progressing   Problem:  Self-Care: Goal: Ability to participate in self-care as condition permits will improve 02/04/2020 1832 by Cristela Blue, RN Outcome: Progressing 02/04/2020 1832 by Cristela Blue, RN Outcome: Progressing Goal: Ability to communicate needs accurately will improve 02/04/2020 1832 by Cristela Blue, RN Outcome: Progressing 02/04/2020 1832 by Cristela Blue, RN Outcome: Progressing   Problem: Nutrition: Goal: Risk of aspiration will decrease 02/04/2020 1832 by Cristela Blue, RN Outcome: Progressing 02/04/2020 1832 by Cristela Blue, RN Outcome: Progressing

## 2020-02-04 NOTE — Progress Notes (Signed)
PROGRESS NOTE   Eric Kennedy  MCN:[id number]    DOB: 01-07-1935    DOA: 01/26/2020  PCP: [name], MD   I have briefly reviewed patients previous medical records in [facility].  Chief Complaint  Patient presents with  . Fall    Brief Narrative:  85 year old male with PMH of CAD, CABG, complete heart block, atrial fibrillation, aortic stenosis, carotid artery stenosis, hypothyroid, DM 2, chronic microcytic anemia, HLD, chronic B12 deficiency, presented to the ED after being found down by his wife.  CT head revealed watershed infarcts in the right MCA/PCA distribution.  He has dense left hemiparesis.  He was noted to have severe anemia with hemoglobin of 4.7.  EGD 11/21 showed gastritis and gastric ulcer, placed on PPI twice daily.  He developed acute hypoxic respiratory failure, intubated for airway protection, extubated 11/22.  Dysphagia 1 diet started after MBS on 11/24. Left upper extremity venous Doppler to evaluate for LUE swelling confirmed DVT on 11/25 and Eliquis was initiated.  Palliative care medicine assessed for goals of care on 11/26, continue to monitor over the weekend and plan to meet with family on 11/29.  Oral intake seems to be improving   Assessment & Plan:  Active Problems:   Upper GI bleed   Acute hypoxemic respiratory failure (HCC)   Acute blood loss anemia   Acute right MCA stroke (HCC)   Cerebral ischemic stroke due to global hypoperfusion with watershed infarct [facility])   Failure to thrive (child)   Goals of care, counseling/discussion   Palliative care by specialist   DNR (do not resuscitate)   Acute hypoxic respiratory failure: Secondary to acute stroke and inability to protect airway.  Intubated and subsequently extubated 11/20. Resolved.  Acute blood loss anemia and hemorrhagic shock due to acute upper GI bleed: S/p EGD which showed gastric ulcer without bleeding and gastritis.  Hemoglobin stable in the 9 g range after transfusions.   Continue twice daily PPI.  No overt bleeding reported. Monitor closely while Eliquis initiated.  Hemoglobin stable in the 9 g range for several days.  Acute right MCA stroke complicated by left hemiplegia, dysarthria and dysphagia Neurology consultation and follow-up appreciated.  Suspect CVA most likely hypoperfusion infarct secondary to anemia/hypotension.  Imaging did not show occlusion of complete carotid no atheroembolic or cardioembolic etiologies remain possible although they indicated that this would not substantially change management at this time. Neurology indicates that they are concerned that he may not regain much movement in his left arm and leg. Patient was on aspirin 81 mg daily and Plavix 75 mg daily. However since the patient developed left upper extremity DVT, I discussed with Neuro Hospitalist on call on 11/25 and he recommended okay with anticoagulation with a DOAC along with aspirin 81 mg daily and Plavix discontinued. Per therapy recommendations, now recommend CIR at discharge.  CIR team coordinating with family regarding potential DC to CIR.  Await follow-up.  Acute left upper extremity DVT Venous Doppler confirms partially occlusive left upper extremity DVT involving brachial, axillary and subclavian veins along with superficial thrombophlebitis involving basilic vein. As discussed above with neuro hospitalist on call, initiated Eliquis 11/25. Patient will remain on aspirin 81 mg daily for recent acute stroke but stopped Plavix. Obviously if patient develops bleeding complications, then will have to stop anticoagulation.  Tolerating for now.  Dysphagia: ST performed MBS and patient initiated on dysphagia 1 diet and thin liquids and they will reassess in 48 hours. Patient reportedly had an  episode of nonbloody emesis yesterday morning. Also refusing to eat/poor oral intake. I discussed in detail with patient's son regarding patient's poor oral intake and need to address goals of  care and he agreed e to a palliative care consult.  Poor oral intake, limited by patient's preferences where he likes some food and does not like others.  Palliative care medicine discussed with son regarding [place] who is okay with temporary feeding tube.  However, will hold off on placing an NG tube given risk of bleeding from being on Eliquis and also EGD a few days ago showed NG tube associated trauma and some bleeding.  Monitor closely over the weekend, encourage oral intake and palliative care team to address goals on Monday 11/29.  As per discussion with RN, patient is picky about his food choices but oral intake seems to be improving.  As per ST, ordering Magic cups and Ensure protein max to come with all meals preferably in chocolate and strawberry flavors.  He is a total assist/needs to be fed.  Hopefully with someone feeding him and feeds of his choices, hopefully oral intake continues to improve.  Hypernatremia Resolved after hypotonic IV fluids.  Remains at risk for recurrent dehydration and hypernatremia from poor oral intake.  Discontinued IVF.  Hypokalemia: Improved.  Potassium 3.9.  Replace and follow.  Magnesium 2.2.  Hypophosphatemia Replaced.  Acute kidney injury complicating stage IIIa CKD Creatinine 1.4 on 01/19/2019.  Presented with creatinine of 2.01 which peaked to 2.64.  Resolved.  IVF discontinued.  Creatinine up slightly to 1.29, will follow BMP in a.m.  Moderate aortic stenosis Noted on 2D echo.  Outpatient follow-up.  NSTEMI Secondary to acute stroke.  Paroxysmal atrial fibrillation Remains in sinus rhythm. Was not on anticoagulation candidate due to recent GI bleed. Now started anticoagulation for DVT, closely monitor for bleeding.  Adult failure to thrive: Palliative care consulted for goals of care  NSVT: Intermittent episodes of NSVT, asymptomatic.  Monitor on telemetry.  Attempt to keep potassium >4 and magnesium >2.  2D echo 11/20: LVEF 50-55%.  Initiated  low-dose carvedilol 3.125 mg twice daily which was titrated up to 6.25 mg twice daily.  Continues to have infrequent episodes of NSVT.  Consider cardiology consult in a.m. but likely nothing more to do other than replacing electrolytes which we have and beta-blockers.   Body mass index is 25.91 kg/m.       DVT prophylaxis: SCDs Start: 01/30/20 1225.  Now on Eliquis since 11/25.   Code Status: DNR Family Communication: I discussed in detail with patient's son at bedside on 11/28, updated care and answered all questions.   Disposition:  Status is: Inpatient  Remains inpatient appropriate because:Inpatient level of care appropriate due to severity of illness   Dispo: The patient is from: Home              Anticipated d/c is to: CIR              Anticipated d/c date is: 1 to 2 days.  Current barrier is to see if patient is tolerating adequate oral intake to meet nutritional needs but otherwise he is medically stable for DC to next level of care.              Patient currently is not medically stable to d/c.        Consultants:   PCCM-signed off Neurology GI Palliative care medicine.  Procedures:   EGD 01/28/2020:  - Z-line irregular, 35 to 37  cm from the incisors. - 1 cm hiatal hernia. - Nasogastric tube trauma present in stomach. - Non-bleeding gastric ulcer with no stigmata of bleeding. - Gastritis. - Normal examined duodenum. - No specimens collected.  S/p extubation  Antimicrobials:    Anti-infectives (From admission, onward)   Start     Dose/Rate Route Frequency Ordered Stop   01/26/20 1845  cefTRIAXone (ROCEPHIN) 1 g in sodium chloride 0.9 % 100 mL IVPB  Status:  Discontinued        1 g 200 mL/hr over 30 Minutes Intravenous Every 24 hours 01/26/20 1831 01/29/20 1016        Subjective:  Met with patient while son was feeding him.  No complaints reported.  Follows instructions well.  As per son, ongoing difficulties getting patient the appropriate foods he  likes.  Oral intake seems to be improving.  Objective:   Vitals:   02/04/20 0332 02/04/20 0340 02/04/20 0755 02/04/20 1130  BP: (!) 127/52  (!) 159/65 (!) 128/58  Pulse: 63  79 67  Resp: '20  18 18  ' Temp: 98.5 F (36.9 C)  98.6 F (37 C) 98.4 F (36.9 C)  TempSrc: Oral     SpO2: 96%  97% 95%  Weight:  77.3 kg    Height:        General exam: Pleasant elderly male, small built and moderately nourished lying comfortably propped up in bed without distress.  Continues to look better day after day.  Sitting up in bed drinking a protein shake using a straw from the bottle, being fed by son. Respiratory system: Clear to auscultation. Respiratory effort normal. Cardiovascular system: S1 & S2 heard, RRR. No JVD, murmurs, rubs, gallops or clicks. No pedal edema.  Telemetry personally reviewed: Alternates between a paced rhythm/sinus rhythm.  10 beat NSVT at 2:36 AM. Gastrointestinal system: Abdomen is nondistended, soft and nontender. No organomegaly or masses felt. Normal bowel sounds heard. Central nervous system: Remains alert, oriented x2, follows simple instructions.  Left facial droop.  Mild dysarthria.  No other cranial nerve deficits appreciated Extremities: Grade 5 x 5 power in the right limbs, grade 0 x 5 power in the left upper extremity and grade 2 x 5 power in the left lower extremity, without change.  Mild bruising of the medial aspect of the right elbow-improving.  Left upper extremity swelling slowly improving. Skin: No rashes, lesions or ulcers Psychiatry: Judgement and insight impaired but improved compared to prior. Mood & affect pleasant and smiling.    Data Reviewed:   I have personally reviewed following labs and imaging studies   CBC: Recent Labs  Lab 02/01/20 0405 02/02/20 0504 02/03/20 0327  WBC 5.4 4.9 4.9  NEUTROABS 4.1 3.6 3.5  HGB 9.2* 9.0* 9.1*  HCT 30.8* 30.8* 30.6*  MCV 77.8* 77.8* 78.1*  PLT 164 167 299    Basic Metabolic Panel: Recent Labs  Lab  01/31/20 0410 01/31/20 0410 02/01/20 0405 02/01/20 0405 02/02/20 0504 02/03/20 0327 02/04/20 0313  NA 148*   < > 146*   < > 142 139 139  K 3.0*   < > 3.0*   < > 3.2* 3.7 3.9  CL 117*   < > 116*   < > 112* 110 110  CO2 21*   < > 21*   < > '23 22 22  ' GLUCOSE 125*   < > 116*   < > 110* 121* 131*  BUN 30*   < > 23   < > 19  17 19  CREATININE 1.69*   < > 1.46*   < > 1.22 1.11 1.29*  CALCIUM 9.4   < > 9.4   < > 9.1 8.8* 8.9  MG 2.3  --  2.2  --   --   --  2.1  PHOS 2.0*   < > 2.1*   < > 2.7 2.7 2.7   < > = values in this interval not displayed.    Liver Function Tests: No results for input(s): AST, ALT, ALKPHOS, BILITOT, PROT, ALBUMIN in the last 168 hours.  CBG: No results for input(s): GLUCAP in the last 168 hours.  Microbiology Studies:   Recent Results (from the past 240 hour(s))  Resp Panel by RT-PCR (Flu A&B, Covid) Nasopharyngeal Swab     Status: None   Collection Time: 01/26/20  6:29 PM   Specimen: Nasopharyngeal Swab; Nasopharyngeal(NP) swabs in vial transport medium  Result Value Ref Range Status   SARS Coronavirus 2 by RT PCR NEGATIVE NEGATIVE Final    Comment: (NOTE) SARS-CoV-2 target nucleic acids are NOT DETECTED.  The SARS-CoV-2 RNA is generally detectable in upper respiratory specimens during the acute phase of infection. The lowest concentration of SARS-CoV-2 viral copies this assay can detect is 138 copies/mL. A negative result does not preclude SARS-Cov-2 infection and should not be used as the sole basis for treatment or other patient management decisions. A negative result may occur with  improper specimen collection/handling, submission of specimen other than nasopharyngeal swab, presence of viral mutation(s) within the areas targeted by this assay, and inadequate number of viral copies(<138 copies/mL). A negative result must be combined with clinical observations, patient history, and epidemiological information. The expected result is Negative.  Fact  Sheet for Patients:  EntrepreneurPulse.com.au  Fact Sheet for Healthcare Providers:  IncredibleEmployment.be  This test is no t yet approved or cleared by the [place] FDA and  has been authorized for detection and/or diagnosis of SARS-CoV-2 by FDA under an Emergency Use Authorization (EUA). This EUA will remain  in effect (meaning this test can be used) for the duration of the COVID-19 declaration under Section 564(b)(1) of the Act, 21 U.S.C.section 360bbb-3(b)(1), unless the authorization is terminated  or revoked sooner.       Influenza A by PCR NEGATIVE NEGATIVE Final   Influenza B by PCR NEGATIVE NEGATIVE Final    Comment: (NOTE) The Xpert Xpress SARS-CoV-2/FLU/RSV plus assay is intended as an aid in the diagnosis of influenza from Nasopharyngeal swab specimens and should not be used as a sole basis for treatment. Nasal washings and aspirates are unacceptable for Xpert Xpress SARS-CoV-2/FLU/RSV testing.  Fact Sheet for Patients: EntrepreneurPulse.com.au  Fact Sheet for Healthcare Providers: IncredibleEmployment.be  This test is not yet approved or cleared by the [place] FDA and has been authorized for detection and/or diagnosis of SARS-CoV-2 by FDA under an Emergency Use Authorization (EUA). This EUA will remain in effect (meaning this test can be used) for the duration of the COVID-19 declaration under Section 564(b)(1) of the Act, 21 U.S.C. section 360bbb-3(b)(1), unless the authorization is terminated or revoked.  Performed at [facility], [address] [place], [place] [address]   Culture, blood (routine x 2)     Status: None   Collection Time: 01/26/20  6:57 PM   Specimen: BLOOD  Result Value Ref Range Status   Specimen Description BLOOD LEFT ANTECUBITAL  Final   Special Requests   Final    BOTTLES DRAWN AEROBIC AND ANAEROBIC Blood  Culture adequate volume   Culture    Final    NO GROWTH 5 DAYS Performed at [facility], [address] [place], [place] [address]    Report Status 01/31/2020 FINAL  Final  Culture, blood (routine x 2)     Status: None   Collection Time: 01/26/20  9:10 PM   Specimen: BLOOD  Result Value Ref Range Status   Specimen Description BLOOD BLOOD LEFT HAND  Final   Special Requests   Final    BOTTLES DRAWN AEROBIC AND ANAEROBIC Blood Culture adequate volume   Culture   Final    NO GROWTH 5 DAYS Performed at [facility], [address] [place], [place] [address]    Report Status 01/31/2020 FINAL  Final  MRSA PCR Screening     Status: None   Collection Time: 01/26/20  9:14 PM   Specimen: Nasopharyngeal  Result Value Ref Range Status   MRSA by PCR NEGATIVE NEGATIVE Final    Comment:        The GeneXpert MRSA Assay (FDA approved for NASAL specimens only), is one component of a comprehensive MRSA colonization surveillance program. It is not intended to diagnose MRSA infection nor to guide or monitor treatment for MRSA infections. Performed at [facility], [address] [place], [place] [address]      Radiology Studies:  No results found.   Scheduled Meds:   . apixaban  10 mg Oral BID   Followed by  . [START ON 02/08/2020] apixaban  5 mg Oral BID  . aspirin  81 mg Oral Daily  . carvedilol  6.25 mg Oral BID WC  . chlorhexidine gluconate (MEDLINE KIT)  15 mL Mouth Rinse BID  . docusate  100 mg Oral BID  . pantoprazole  40 mg Oral BID AC  . polyethylene glycol  17 g Oral Daily  . sodium chloride flush  10-40 mL Intracatheter Q12H    Continuous Infusions:      LOS: 9 days     Vernell Leep, MD, [place], [facility]. [facility]    To contact the attending provider between 7A-7P or the covering provider during after hours 7P-7A, please log into the web site [email or web address] and access using universal [place] password for that web site. If you do not have the password,  please call the hospital operator.  02/04/2020, 12:01 PM

## 2020-02-05 DIAGNOSIS — I63511 Cerebral infarction due to unspecified occlusion or stenosis of right middle cerebral artery: Secondary | ICD-10-CM | POA: Diagnosis not present

## 2020-02-05 DIAGNOSIS — R1310 Dysphagia, unspecified: Secondary | ICD-10-CM | POA: Diagnosis not present

## 2020-02-05 DIAGNOSIS — I472 Ventricular tachycardia: Secondary | ICD-10-CM | POA: Diagnosis not present

## 2020-02-05 DIAGNOSIS — Z515 Encounter for palliative care: Secondary | ICD-10-CM | POA: Diagnosis not present

## 2020-02-05 DIAGNOSIS — Z7189 Other specified counseling: Secondary | ICD-10-CM | POA: Diagnosis not present

## 2020-02-05 DIAGNOSIS — I82622 Acute embolism and thrombosis of deep veins of left upper extremity: Secondary | ICD-10-CM | POA: Diagnosis not present

## 2020-02-05 DIAGNOSIS — D62 Acute posthemorrhagic anemia: Secondary | ICD-10-CM | POA: Diagnosis not present

## 2020-02-05 LAB — BASIC METABOLIC PANEL
Anion gap: 8 (ref 5–15)
BUN: 23 mg/dL (ref 8–23)
CO2: 23 mmol/L (ref 22–32)
Calcium: 9.4 mg/dL (ref 8.9–10.3)
Chloride: 109 mmol/L (ref 98–111)
Creatinine, Ser: 1.15 mg/dL (ref 0.61–1.24)
GFR, Estimated: >60 mL/min (ref >60)
Glucose, Bld: 109 mg/dL — ABNORMAL HIGH (ref 70–99)
Potassium: 3.8 mmol/L (ref 3.5–5.1)
Sodium: 140 mmol/L (ref 135–145)

## 2020-02-05 NOTE — Progress Notes (Addendum)
Palliative:  Eric Kennedy is sitting up quietly in bed.  He appears acutely/chronically ill and frail, somewhat pale.  He wakes easily when I called his name, and is able to make his needs known.  He is able to take sips of liquids without overt signs and symptoms of aspiration.  Eric Kennedy is able to take a wet paper towel and wipe his face.  There is no family at bedside at this time.  I share that I will return later in the afternoon to meet with his son.  Conference with bedside nursing staff related to patient condition, needs, goals of care.  Eric Kennedy is sitting up in the bed with his wife at bedside feeding him.  His son, Eric Kennedy, is also present.  Eric Kennedy seems to cough after food and thin liquids, but does not look to be in distress.  Eric Kennedy and I go to a quiet place for goals of care discussion.  We talk in detail about Eric Kennedy acute health concerns.  We talked about short-term rehab versus CIR.  It seems that at this point, Eric Kennedy is not strong enough for CIR. Eric Kennedy and I talk in detail about the benefits and limitations of both CIR and short-term rehab.  We talked about the benefits of short-term rehab, and time for outcomes, especially with stroke.  I encouraged Eric Kennedy to work closely with Education officer, museum at any facility his father is admitted to.  As Eric Kennedy and I are talking, speech therapy arrives.  We talk in detail about Eric Kennedy' swallow study, diet, nutrition.  We talked about the issues related to keeping up nutrition including, but not limited to physical concerns related to stroke, texture of food and liquid, patient preferences.  We also talked about PEG tube for temporary trial.  We talked about setting firm limits, and looking for "meaningful improvements".  We talked about some future "what if's and maybe's".  I shared that just like I will get sick again, so will Eric Kennedy.  I encourage Eric Kennedy to consider how to care for him, what he want to continue to be hospitalized.  We talked about  time for outcomes, but share my worry about Eric Kennedy' ability to return home without meaningful improvement in his ability to stand and balance.  Eric Kennedy shares that they do have a long-term care policy.  We talked about how to make choices for loved ones including 1) keeping them at the center of decision-making, not what we want for them 2) are we doing something for him or to him, can we change what is happening 3) the person Eric Kennedy was 3 years ago, how would that man tell Eric Kennedy to care for him now.  At this point, family is requesting short-term rehab, time for outcomes. Outpatient palliative to follow.  Conference with attending, bedside nursing staff, transition of care team related to patient condition, needs, goals of care.  Plan:   At this point, continue to treat the treatable but no CPR or intubation.  Short-term rehab.      65 minutes, extended time  Quinn Axe, NP Palliative Medicine Team Team Phone # [phone] Greater than 50% of this time was spent counseling and coordinating care related to the above assessment and plan.

## 2020-02-05 NOTE — Progress Notes (Signed)
Occupational Therapy Treatment Patient Details Name: Eric Kennedy MRN: [id number] DOB: 24-Nov-1934 Today's Date: 02/05/2020    History of present illness Patient is an 85 y.o. male presenting to the emergency room after being found down by patient's wife.  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  Patient has left dense hemiparesis.  Patient intubated and now extubated on 11/22. Acute hemorrhagic shock with severe anemia with hemoglobin of 4.7 initially. Patient has a known history of carotid stenosis, diabetes mellitus type 2, chronic anemia, complete heart block, HTN, atrial fibrillation, s/p CABG.    OT comments  Pt seen for OT tx this date. Pt received with partial breakfast eaten and observed that condom catheter not functioning properly, leading to linens saturated with urine. With nurse tech assist, OT instructed in pt in log rolling including LUE joint protection during task, RUE involvement to reach for bed rail to improve independence, and BLE involvement. With cues, pt able to use BLE and RUE in task, still requiring Max A overall for rolling; total for pericare. Pt able to follow commands with verbal/tactile cues. Pt repositioned with pillows to support optimal postioning of LUE in bed. With set up of cup with lid and straw, pt able to use RUE to bring cup to mouth to take small sips. Pt continues to benefit from high-intensity skilled OT services to maximize safety/return to PLOF and minimize caregiver burden or need for long term higher level of care. Continue to recommend CIR.   Follow Up Recommendations  CIR    Equipment Recommendations  Other (comment) (defer to next venue of care)    Recommendations for Other Services      Precautions / Restrictions Precautions Precautions: Fall Precaution Comments: L hemi, L inattention Restrictions Weight Bearing Restrictions: No       Mobility Bed Mobility Overal bed mobility: Needs Assistance Bed  Mobility: Rolling Rolling: Max assist         General bed mobility comments: Max A for rolling side to side with tactile and verbal cues for hand placement to help involve RUE in task and protect LUE  Transfers                      Balance                                           ADL either performed or assessed with clinical judgement   ADL Overall ADL's : Needs assistance/impaired Eating/Feeding: Set up;Bed level;Cueing for sequencing Eating/Feeding Details (indicate cue type and reason): cues for grasping cup with R hand and able to bring to mouth to take small sips (lid and bendable straw), CNA reports pt able to self feed portion of breakfast with cues                                         Vision       Perception     Praxis      Cognition Arousal/Alertness: Awake/alert Behavior During Therapy: Flat affect Overall Cognitive Status: Impaired/Different from baseline Area of Impairment: Orientation;Following commands;Safety/judgement;Awareness;Problem solving                 Orientation Level: Disoriented to;Time     Following Commands: Follows one step commands  with increased time;Follows one step commands inconsistently Safety/Judgement: Decreased awareness of deficits;Decreased awareness of safety Awareness: Intellectual Problem Solving: Slow processing;Decreased initiation;Requires tactile cues;Difficulty sequencing;Requires verbal cues          Exercises Other Exercises Other Exercises: Bed level toileting and linens change this date with nurse tech assist; pt instructed in LUE joint protection during task as well as RUE involvement to improve independence, with cues pt able to use BLE and RUE in task, still requiring Max A overall for rolling; total for pericare   Shoulder Instructions       General Comments      Pertinent Vitals/ Pain       Pain Assessment: No/denies pain  Home Living                                           Prior Functioning/Environment              Frequency  Min 2X/week        Progress Toward Goals  OT Goals(current goals can now be found in the care plan section)  Progress towards OT goals: Progressing toward goals  Acute Rehab OT Goals Patient Stated Goal: none stated OT Goal Formulation: Patient unable to participate in goal setting Time For Goal Achievement: 02/13/20 Potential to Achieve Goals: [place] Discharge plan remains appropriate;Frequency needs to be updated    Co-evaluation                 AM-PAC OT "6 Clicks" Daily Activity     Outcome Measure   Help from another person eating meals?: A Little Help from another person taking care of personal grooming?: A Lot Help from another person toileting, which includes using toliet, bedpan, or urinal?: Total Help from another person bathing (including washing, rinsing, drying)?: Total Help from another person to put on and taking off regular upper body clothing?: A Lot Help from another person to put on and taking off regular lower body clothing?: Total 6 Click Score: 10    End of Session    OT Visit Diagnosis: Other abnormalities of gait and mobility (R26.89);Other symptoms and signs involving the nervous system (R29.898);Other symptoms and signs involving cognitive function;Hemiplegia and hemiparesis Hemiplegia - Right/Left: Left Hemiplegia - caused by: Cerebral infarction   Activity Tolerance Patient tolerated treatment well   Patient Left in bed;with call bell/phone within reach;with bed alarm set;with nursing/sitter in room   Nurse Communication          Time: 0940-1003 OT Time Calculation (min): 23 min  Charges: OT General Charges $OT Visit: 1 Visit OT Treatments $Self Care/Home Management : 23-37 mins  Jeni Salles, MPH, MS, OTR/L ascom [phone] 02/05/20, 10:17 AM

## 2020-02-05 NOTE — Progress Notes (Signed)
Inpatient Rehabilitation Admissions Coordinator  I contacted pt's son by phone. He is in town from [place]. I explained that his Dad will need to be able to demonstrate more ability with therapies before I could pursue CIR admit. Bed level therapies thus far. Son prefers CIR rather than SNF and he is attempting to arrange 24/7 care at home after any rehab stay. I have alerted acute team and TOC of my assessment. Please call me with any questions.  Danne Baxter, RN, MSN Rehab Admissions Coordinator [phone] 02/05/2020 12:39 PM

## 2020-02-05 NOTE — Progress Notes (Signed)
PROGRESS NOTE   Eric Kennedy  BMW:[id number]    DOB: 09/07/1934    DOA: 01/26/2020  PCP: [name], MD   I have briefly reviewed patients previous medical records in [facility].  Chief Complaint  Patient presents with  . Fall    Brief Narrative:  85 year old male with PMH of CAD, CABG, complete heart block, atrial fibrillation, aortic stenosis, carotid artery stenosis, hypothyroid, DM 2, chronic microcytic anemia, HLD, chronic B12 deficiency, presented to the ED after being found down by his wife.  CT head revealed watershed infarcts in the right MCA/PCA distribution.  He has dense left hemiparesis.  He was noted to have severe anemia with hemoglobin of 4.7.  EGD 11/21 showed gastritis and gastric ulcer, placed on PPI twice daily.  He developed acute hypoxic respiratory failure, intubated for airway protection, extubated 11/22.  Dysphagia 1 diet started after MBS on 11/24. Left upper extremity venous Doppler to evaluate for LUE swelling confirmed DVT on 11/25 and Eliquis was initiated.  Overall intake overall is gradually improved.  Therapy/CIR team have reassessed and do not think that he is appropriate for CIR but more for SNF.  Palliative care medicine has followed up with patient/family again on 11/29.   Assessment & Plan:  Active Problems:   Upper GI bleed   Acute hypoxemic respiratory failure (HCC)   Acute blood loss anemia   Acute right MCA stroke (HCC)   Cerebral ischemic stroke due to global hypoperfusion with watershed infarct [facility])   Failure to thrive (child)   Goals of care, counseling/discussion   Palliative care by specialist   DNR (do not resuscitate)   Acute hypoxic respiratory failure: Secondary to acute stroke and inability to protect airway.  Intubated and subsequently extubated 11/20. Resolved.  Acute blood loss anemia and hemorrhagic shock due to acute upper GI bleed: S/p EGD which showed gastric ulcer without bleeding and gastritis.  Hemoglobin  stable in the 9 g range after transfusions.  Continue twice daily PPI.  No overt bleeding reported. Monitor closely while Eliquis initiated.  Hemoglobin stable in the 9 g range for several days.  Acute right MCA stroke complicated by left hemiplegia, dysarthria and dysphagia Neurology consultation and follow-up appreciated.  Suspect CVA most likely hypoperfusion infarct secondary to anemia/hypotension.  Imaging did not show occlusion of complete carotid no atheroembolic or cardioembolic etiologies remain possible although they indicated that this would not substantially change management at this time. Neurology indicates that they are concerned that he may not regain much movement in his left arm and leg. Patient was on aspirin 81 mg daily and Plavix 75 mg daily. However since the patient developed left upper extremity DVT, I discussed with Neuro Hospitalist on call on 11/25 and he recommended okay with anticoagulation with a DOAC along with aspirin 81 mg daily and Plavix discontinued. Therapy/CIR team have reassessed and do not think that he is appropriate for CIR but more for SNF.  Acute left upper extremity DVT Venous Doppler confirms partially occlusive left upper extremity DVT involving brachial, axillary and subclavian veins along with superficial thrombophlebitis involving basilic vein. As discussed above with neuro hospitalist on call, initiated Eliquis 11/25. Patient will remain on aspirin 81 mg daily for recent acute stroke but stopped Plavix. Obviously if patient develops bleeding complications, then will have to stop anticoagulation.  Tolerating for now.  Dysphagia: ST performed MBS and patient initiated on dysphagia 1 diet and thin liquids and they will reassess in 48 hours. Patient reportedly  had an episode of nonbloody emesis yesterday morning. Also refusing to eat/poor oral intake. I discussed in detail with patient's son regarding patient's poor oral intake and need to address goals of care  and he agreed e to a palliative care consult.  Poor oral intake, limited by patient's preferences where he likes some food and does not like others.  Palliative care medicine discussed with son regarding [place] who is okay with temporary feeding tube.  However, will hold off on placing an NG tube given risk of bleeding from being on Eliquis and also EGD a few days ago showed NG tube associated trauma and some bleeding.  Monitor closely over the weekend, encourage oral intake and palliative care team to address goals on Monday 11/29.  As per discussion with RN, patient is picky about his food choices but oral intake seems to be improving.  As per ST, ordering Magic cups and Ensure protein max to come with all meals preferably in chocolate and strawberry flavors.  He is a total assist/needs to be fed.  Hopefully with someone feeding him and feeds of his choices, hopefully oral intake continues to improve.  Hypernatremia Resolved after hypotonic IV fluids.  Remains at risk for recurrent dehydration and hypernatremia from poor oral intake.  Discontinued IVF.  Hypokalemia: Improved.  Potassium 3.9.  Replace and follow.  Magnesium 2.2.  Hypophosphatemia Replaced.  Acute kidney injury complicating stage IIIa CKD Creatinine 1.4 on 01/19/2019.  Presented with creatinine of 2.01 which peaked to 2.64.  Resolved.  IVF discontinued.  Creatinine up slightly to 1.29, but back down to 1.15  Moderate aortic stenosis Noted on 2D echo.  Outpatient follow-up.  NSTEMI Secondary to acute stroke.  Paroxysmal atrial fibrillation Remains in sinus rhythm. Was not on anticoagulation candidate due to recent GI bleed. Now started anticoagulation for DVT, closely monitor for bleeding.  Adult failure to thrive: Palliative care consulted for goals of care  NSVT: Intermittent episodes of NSVT, asymptomatic.  Monitor on telemetry.  Attempt to keep potassium >4 and magnesium >2.  2D echo 11/20: LVEF 50-55%.  Initiated low-dose  carvedilol 3.125 mg twice daily which was titrated up to 6.25 mg twice daily.  No further episodes of NSVT noted in the last 24 hours.  Outpatient follow-up with cardiology.   Body mass index is 25.91 kg/m.       DVT prophylaxis: SCDs Start: 01/30/20 1225.  Now on Eliquis since 11/25.   Code Status: DNR Family Communication: I discussed in detail with patient's son at bedside on 11/29, updated care and answered all questions.  Advised him that patient is approaching stability for discharge and he plans to discuss with his family and explore SNF options. Disposition:  Status is: Inpatient  Remains inpatient appropriate because:Inpatient level of care appropriate due to severity of illness   Dispo: The patient is from: Home              Anticipated d/c is to: CIR              Anticipated d/c date is: 1 to 2 days.  Current barrier is to see if patient is tolerating adequate oral intake to meet nutritional needs but otherwise he is medically stable for DC to next level of care.              Patient currently is not medically stable to d/c.        Consultants:   PCCM-signed off Neurology GI Palliative care medicine.  Procedures:  EGD 01/28/2020:  - Z-line irregular, 35 to 37 cm from the incisors. - 1 cm hiatal hernia. - Nasogastric tube trauma present in stomach. - Non-bleeding gastric ulcer with no stigmata of bleeding. - Gastritis. - Normal examined duodenum. - No specimens collected.  S/p extubation  Antimicrobials:    Anti-infectives (From admission, onward)   Start     Dose/Rate Route Frequency Ordered Stop   01/26/20 1845  cefTRIAXone (ROCEPHIN) 1 g in sodium chloride 0.9 % 100 mL IVPB  Status:  Discontinued        1 g 200 mL/hr over 30 Minutes Intravenous Every 24 hours 01/26/20 1831 01/29/20 1016        Subjective:  Patient seen this morning while he was by himself.  Noted partly naked.  Alert and oriented x2.  "I am good".  Able to follow simple  instructions.  As per RN, no acute issues noted.  Objective:   Vitals:   02/05/20 0611 02/05/20 0825 02/05/20 1205 02/05/20 1516  BP: (!) 158/49 (!) 161/51 (!) 126/54 (!) 117/54  Pulse: 68 71 61 60  Resp: '16 16 16 18  ' Temp: 98.4 F (36.9 C) 99.1 F (37.3 C) 97.8 F (36.6 C) (!) 97.5 F (36.4 C)  TempSrc:    Oral  SpO2: 96% 95% 96% 96%  Weight:      Height:        General exam: Pleasant elderly male, small built and moderately nourished lying comfortably propped up in bed without distress.  Does not appear in any distress. Respiratory system: Clear to auscultation.  No increased work of breathing. Cardiovascular system: S1 & S2 heard, RRR. No JVD, murmurs, rubs, gallops or clicks. No pedal edema.  Telemetry personally reviewed: V paced rhythm/intermittent sinus rhythm.  No further NSVT over the last 24 hours. Gastrointestinal system: Abdomen is nondistended, soft and nontender. No organomegaly or masses felt. Normal bowel sounds heard. Central nervous system: Remains alert, oriented x2, follows simple instructions.  Left facial droop and mild dysarthria seem to be improving slowly.  No other cranial nerve deficits appreciated Extremities: Grade 5 x 5 power in the right limbs, grade 0 x 5 power in the left upper extremity and grade 2 x 5 power in the left lower extremity, without change.  Mild bruising of the medial aspect of the right elbow-improving.  Left upper extremity swelling slowly improving. Skin: No rashes, lesions or ulcers Psychiatry: Judgement and insight impaired but improved compared to prior. Mood & affect pleasant and appropriate    Data Reviewed:   I have personally reviewed following labs and imaging studies   CBC: Recent Labs  Lab 02/01/20 0405 02/02/20 0504 02/03/20 0327  WBC 5.4 4.9 4.9  NEUTROABS 4.1 3.6 3.5  HGB 9.2* 9.0* 9.1*  HCT 30.8* 30.8* 30.6*  MCV 77.8* 77.8* 78.1*  PLT 164 167 440    Basic Metabolic Panel: Recent Labs  Lab  01/31/20 0410 01/31/20 0410 02/01/20 0405 02/01/20 0405 02/02/20 0504 02/02/20 0504 02/03/20 0327 02/04/20 0313 02/05/20 0423  NA 148*   < > 146*   < > 142   < > 139 139 140  K 3.0*   < > 3.0*   < > 3.2*   < > 3.7 3.9 3.8  CL 117*   < > 116*   < > 112*   < > 110 110 109  CO2 21*   < > 21*   < > 23   < > '22 22 23  ' GLUCOSE  125*   < > 116*   < > 110*   < > 121* 131* 109*  BUN 30*   < > 23   < > 19   < > '17 19 23  ' CREATININE 1.69*   < > 1.46*   < > 1.22   < > 1.11 1.29* 1.15  CALCIUM 9.4   < > 9.4   < > 9.1   < > 8.8* 8.9 9.4  MG 2.3  --  2.2  --   --   --   --  2.1  --   PHOS 2.0*   < > 2.1*   < > 2.7  --  2.7 2.7  --    < > = values in this interval not displayed.    Liver Function Tests: No results for input(s): AST, ALT, ALKPHOS, BILITOT, PROT, ALBUMIN in the last 168 hours.  CBG: No results for input(s): GLUCAP in the last 168 hours.  Microbiology Studies:   Recent Results (from the past 240 hour(s))  Culture, blood (routine x 2)     Status: None   Collection Time: 01/26/20  6:57 PM   Specimen: BLOOD  Result Value Ref Range Status   Specimen Description BLOOD LEFT ANTECUBITAL  Final   Special Requests   Final    BOTTLES DRAWN AEROBIC AND ANAEROBIC Blood Culture adequate volume   Culture   Final    NO GROWTH 5 DAYS Performed at [facility], [address], [place], [place] [address]    Report Status 01/31/2020 FINAL  Final  Culture, blood (routine x 2)     Status: None   Collection Time: 01/26/20  9:10 PM   Specimen: BLOOD  Result Value Ref Range Status   Specimen Description BLOOD BLOOD LEFT HAND  Final   Special Requests   Final    BOTTLES DRAWN AEROBIC AND ANAEROBIC Blood Culture adequate volume   Culture   Final    NO GROWTH 5 DAYS Performed at [facility] [address] [place], [place] [address]    Report Status 01/31/2020 FINAL  Final  MRSA PCR Screening     Status: None   Collection Time: 01/26/20  9:14 PM   Specimen:  Nasopharyngeal  Result Value Ref Range Status   MRSA by PCR NEGATIVE NEGATIVE Final    Comment:        The GeneXpert MRSA Assay (FDA approved for NASAL specimens only), is one component of a comprehensive MRSA colonization surveillance program. It is not intended to diagnose MRSA infection nor to guide or monitor treatment for MRSA infections. Performed at [facility], [address] [place], [place] [address]      Radiology Studies:  No results found.   Scheduled Meds:   . apixaban  10 mg Oral BID   Followed by  . [START ON 02/08/2020] apixaban  5 mg Oral BID  . aspirin  81 mg Oral Daily  . carvedilol  6.25 mg Oral BID WC  . chlorhexidine gluconate (MEDLINE KIT)  15 mL Mouth Rinse BID  . docusate  100 mg Oral BID  . pantoprazole  40 mg Oral BID AC  . polyethylene glycol  17 g Oral Daily  . sodium chloride flush  10-40 mL Intracatheter Q12H    Continuous Infusions:      LOS: 10 days     Vernell Leep, MD, [place], [facility]    To contact the attending provider between 7A-7P or the  covering provider during after hours 7P-7A, please log into the web site [email or web address] and access using universal [place] password for that web site. If you do not have the password, please call the hospital operator.  02/05/2020, 6:33 PM

## 2020-02-05 NOTE — TOC Progression Note (Addendum)
Transition of Care [facility]) - Progression Note    Patient Details  Name: Eric Kennedy MRN: [id number] Date of Birth: Feb 02, 1935  Transition of [facility]) CM/SW [place], LCSW Phone Number: 02/05/2020, 2:04 PM  Clinical Narrative:   Spoke to patient's son [name]. Explained that per MD and PT, patient is more appropriate for SNF at this time. [name] would much prefer CIR and wants them to reconsider. Explained that we need a back up plan if patient cannot go to CIR. He reported the only option would be SNF. Provided list of SNFs who made a bed offer for [name] to research. [name] reported he is also meeting with Palliative Care today. [name] asked for an update from MD, informed MD. Updated medical team of conversation with son. CSW will continue to follow.     Expected Discharge Plan: [place] Barriers to Discharge: Continued Medical Work up  Expected Discharge Plan and Services Expected Discharge Plan: [place] In-house Referral: Clinical Social Work   Post Acute Care Choice: [place] arrangements for the past 2 months: Single Family Home                                       Social Determinants of Health (SDOH) Interventions    Readmission Risk Interventions No flowsheet data found.

## 2020-02-05 NOTE — Progress Notes (Addendum)
Physical Therapy Treatment Patient Details Name: Eric Kennedy MRN: [id number] DOB: 11-20-1934 Today's Date: 02/05/2020    History of Present Illness Patient is an 85 y.o. male presenting to the emergency room after being found down by patient's wife.  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  Patient has left dense hemiparesis.  Patient intubated and now extubated on 11/22. Acute hemorrhagic shock with severe anemia with hemoglobin of 4.7 initially. Patient has a known history of carotid stenosis, diabetes mellitus type 2, chronic anemia, complete heart block, HTN, atrial fibrillation, s/p CABG.     PT Comments    Pt asleep this am but will awaken briefly but quickly falls back asleep.  Light adjusted and session prior to lunch in attempts to awaken him to eat.  Participated in exercises as described below.  He is assisted to sitting with max a and requires max a to remain sitting.  He does exhibit some pushing to left which makes sitting difficult.  Again while sitting he drifts back to sleep.  After 4 minutes he is assisted back to supine with max assist for transition and to reposition.  Discussed with team.  Given pt's lethargy and inability to tolerate therapy sessions needed for CIR, will adjust discharge recommendations to SNF.  Son has voiced his resistance to SNF and may be looking to take pt home with 24 hour care.  If home he will need 24 hour care, hospital bed, wheelchair and cushion, 3-in 1 commode and a mechanical lift for transfer would be beneficial.   Patient suffers from CVA which impairs his/her ability to perform daily activities like toileting, feeding, dressing, grooming, bathing in the home. A cane, walker, crutch will not resolve the patient's issue with performing activities of daily living. A lightweight wheelchair and cushion is required/recommended and will allow patient to safely perform daily activities.   Patient can safely propel the  wheelchair in the home or has a caregiver who can provide assistance.    Follow Up Recommendations  SNF;Other (comment)     Equipment Recommendations   (TBD in next venue)    Recommendations for Other Services Rehab consult     Precautions / Restrictions Precautions Precautions: Fall Precaution Comments: L hemi, L inattention Restrictions Weight Bearing Restrictions: No    Mobility  Bed Mobility Overal bed mobility: Needs Assistance Bed Mobility: Rolling Rolling: Max assist         General bed mobility comments: Max A for rolling side to side with tactile and verbal cues for hand placement to help involve RUE in task and protect LUE  Transfers                 General transfer comment: NT  Ambulation/Gait                 Stairs             Wheelchair Mobility    Modified Rankin (Stroke Patients Only)       Balance Overall balance assessment: Needs assistance Sitting-balance support: Feet supported (RUE support) Sitting balance-Leahy Scale: Zero Sitting balance - Comments: max/total assist sitting EOB Postural control: Posterior lean;Left lateral lean                                  Cognition Arousal/Alertness: Lethargic Behavior During Therapy: Flat affect Overall Cognitive Status: Impaired/Different from baseline Area of Impairment: Orientation;Following commands;Safety/judgement;Awareness;Problem solving  Orientation Level: Disoriented to;Time     Following Commands: Follows one step commands with increased time;Follows one step commands inconsistently Safety/Judgement: Decreased awareness of deficits;Decreased awareness of safety Awareness: Intellectual Problem Solving: Slow processing;Decreased initiation;Requires tactile cues;Difficulty sequencing;Requires verbal cues        Exercises General Exercises - Lower Extremity Ankle Circles/Pumps: PROM;Strengthening;Left;10 reps;Supine Heel  Slides: AAROM;Strengthening;Left;10 reps;Supine;PROM Hip ABduction/ADduction: AAROM;Strengthening;10 reps;Supine;PROM Other Exercises Other Exercises: Bed level toileting and linens change this date with nurse tech assist; pt instructed in LUE joint protection during task as well as RUE involvement to improve independence, with cues pt able to use BLE and RUE in task, still requiring Max A overall for rolling; total for pericare    General Comments        Pertinent Vitals/Pain Pain Assessment: No/denies pain    Home Living                      Prior Function            PT Goals (current goals can now be found in the care plan section) Acute Rehab PT Goals Patient Stated Goal: none stated Progress towards PT goals: Not progressing toward goals - comment    Frequency    7X/week      PT Plan Discharge plan needs to be updated    Co-evaluation              AM-PAC PT "6 Clicks" Mobility   Outcome Measure  Help needed turning from your back to your side while in a flat bed without using bedrails?: Total Help needed moving from lying on your back to sitting on the side of a flat bed without using bedrails?: Total Help needed moving to and from a bed to a chair (including a wheelchair)?: Total Help needed standing up from a chair using your arms (e.g., wheelchair or bedside chair)?: Total Help needed to walk in hospital room?: Total Help needed climbing 3-5 steps with a railing? : Total 6 Click Score: 6    End of Session   Activity Tolerance: Patient tolerated treatment well;Patient limited by fatigue Patient left: in bed;with bed alarm set;with call bell/phone within reach;with SCD's reapplied Nurse Communication: Mobility status PT Visit Diagnosis: Other abnormalities of gait and mobility (R26.89);Muscle weakness (generalized) (M62.81);Hemiplegia and hemiparesis;Difficulty in walking, not elsewhere classified (R26.2) Hemiplegia - Right/Left:  Left Hemiplegia - dominant/non-dominant: Non-dominant Hemiplegia - caused by: Cerebral infarction     Time: 8329-1916 PT Time Calculation (min) (ACUTE ONLY): 13 min  Charges:  $Therapeutic Exercise: 8-22 mins                    Chesley Noon, PTA 02/05/20, 1:05 PM

## 2020-02-05 NOTE — Plan of Care (Signed)
Problem: Education: Goal: Knowledge of General Education information will improve Description: Including pain rating scale, medication(s)/side effects and non-pharmacologic comfort measures 02/05/2020 1027 by Cristela Blue, RN Outcome: Progressing 02/05/2020 1027 by Cristela Blue, RN Outcome: Progressing   Problem: Health Behavior/Discharge Planning: Goal: Ability to manage health-related needs will improve 02/05/2020 1027 by Cristela Blue, RN Outcome: Progressing 02/05/2020 1027 by Cristela Blue, RN Outcome: Progressing   Problem: Clinical Measurements: Goal: Ability to maintain clinical measurements within normal limits will improve 02/05/2020 1027 by Cristela Blue, RN Outcome: Progressing 02/05/2020 1027 by Cristela Blue, RN Outcome: Progressing Goal: Will remain free from infection 02/05/2020 1027 by Cristela Blue, RN Outcome: Progressing 02/05/2020 1027 by Cristela Blue, RN Outcome: Progressing Goal: Diagnostic test results will improve 02/05/2020 1027 by Cristela Blue, RN Outcome: Progressing 02/05/2020 1027 by Cristela Blue, RN Outcome: Progressing Goal: Respiratory complications will improve 02/05/2020 1027 by Cristela Blue, RN Outcome: Progressing 02/05/2020 1027 by Cristela Blue, RN Outcome: Progressing Goal: Cardiovascular complication will be avoided 02/05/2020 1027 by Cristela Blue, RN Outcome: Progressing 02/05/2020 1027 by Cristela Blue, RN Outcome: Progressing   Problem: Activity: Goal: Risk for activity intolerance will decrease 02/05/2020 1027 by Cristela Blue, RN Outcome: Progressing 02/05/2020 1027 by Cristela Blue, RN Outcome: Progressing   Problem: Nutrition: Goal: Adequate nutrition will be maintained 02/05/2020 1027 by Cristela Blue, RN Outcome: Progressing 02/05/2020 1027 by Cristela Blue, RN Outcome: Progressing   Problem: Elimination: Goal: Will not experience complications related to bowel motility 02/05/2020  1027 by Cristela Blue, RN Outcome: Progressing 02/05/2020 1027 by Cristela Blue, RN Outcome: Progressing Goal: Will not experience complications related to urinary retention 02/05/2020 1027 by Cristela Blue, RN Outcome: Progressing 02/05/2020 1027 by Cristela Blue, RN Outcome: Progressing   Problem: Pain Managment: Goal: General experience of comfort will improve 02/05/2020 1027 by Cristela Blue, RN Outcome: Progressing 02/05/2020 1027 by Cristela Blue, RN Outcome: Progressing   Problem: Safety: Goal: Ability to remain free from injury will improve 02/05/2020 1027 by Cristela Blue, RN Outcome: Progressing 02/05/2020 1027 by Cristela Blue, RN Outcome: Progressing   Problem: Skin Integrity: Goal: Risk for impaired skin integrity will decrease 02/05/2020 1027 by Cristela Blue, RN Outcome: Progressing 02/05/2020 1027 by Cristela Blue, RN Outcome: Progressing   Problem: Education: Goal: Knowledge of disease or condition will improve 02/05/2020 1027 by Cristela Blue, RN Outcome: Progressing 02/05/2020 1027 by Cristela Blue, RN Outcome: Progressing Goal: Knowledge of secondary prevention will improve 02/05/2020 1027 by Cristela Blue, RN Outcome: Progressing 02/05/2020 1027 by Cristela Blue, RN Outcome: Progressing Goal: Knowledge of patient specific risk factors addressed and post discharge goals established will improve 02/05/2020 1027 by Cristela Blue, RN Outcome: Progressing 02/05/2020 1027 by Cristela Blue, RN Outcome: Progressing   Problem: Coping: Goal: Will identify appropriate support needs 02/05/2020 1027 by Cristela Blue, RN Outcome: Progressing 02/05/2020 1027 by Cristela Blue, RN Outcome: Progressing   Problem: Health Behavior/Discharge Planning: Goal: Ability to manage health-related needs will improve 02/05/2020 1027 by Cristela Blue, RN Outcome: Progressing 02/05/2020 1027 by Cristela Blue, RN Outcome: Progressing   Problem:  Self-Care: Goal: Ability to participate in self-care as condition permits will improve 02/05/2020 1027 by Cristela Blue, RN Outcome: Progressing 02/05/2020 1027 by Cristela Blue, RN Outcome: Progressing Goal: Ability to communicate needs accurately will improve 02/05/2020 1027 by Cristela Blue, RN Outcome: Progressing 02/05/2020 1027 by Cristela Blue, RN Outcome: Progressing   Problem: Nutrition: Goal: Risk of aspiration will decrease 02/05/2020 1027 by Cristela Blue, RN Outcome: Progressing 02/05/2020 1027 by Cristela Blue, RN Outcome: Progressing

## 2020-02-05 NOTE — Progress Notes (Signed)
SLP Cancellation Note  Patient Details Name: Eric Kennedy MRN: [id number] DOB: 17-Mar-1934   Cancelled treatment:       Reason Eval/Treat Not Completed: Fatigue/lethargy limiting ability to participate   Despite multiple multi-modal support, pt was not able to arouse for participation in [place] session. Will continue attempts at next available opportunity.   Late entry: 02/03/2020 - 30 minutes unbillable time spent educating pt's son over the phone. This Probation officer provided education on supplemental puree/thin liquids items to help pt have more possibilities to select from. Recommendations included baby food isle, [employer]. All questions answered to son's satisfaction.   Eric Kennedy M.S., CCC-SLP, [place] Office [phone]    Eric Kennedy 02/05/2020, 8:52 AM

## 2020-02-05 NOTE — Care Management Important Message (Signed)
Important Message  Patient Details  Name: Eric Kennedy MRN: [id number] Date of Birth: [birth year]   Medicare Important Message Given:  Yes     Juliann Pulse A [name] 02/05/2020, 11:17 AM

## 2020-02-05 NOTE — TOC Progression Note (Signed)
Transition of Care [facility]) - Progression Note    Patient Details  Name: Eric Kennedy MRN: [id number] Date of Birth: September 04, 1934  Transition of Care [facility]) CM/SW [place], LCSW Phone Number: 02/05/2020, 11:01 AM  Clinical Narrative:   CSW reached out to CIR Representative to inquire about if patient will be able to go there at discharge.  Expected Discharge Plan: [place] Barriers to Discharge: Continued Medical Work up  Expected Discharge Plan and Services Expected Discharge Plan: [place] In-house Referral: Clinical Social Work   Post Acute Care Choice: [place] arrangements for the past 2 months: Single Family Home                                       Social Determinants of Health (SDOH) Interventions    Readmission Risk Interventions No flowsheet data found.

## 2020-02-06 DIAGNOSIS — I472 Ventricular tachycardia: Secondary | ICD-10-CM | POA: Diagnosis not present

## 2020-02-06 DIAGNOSIS — Z515 Encounter for palliative care: Secondary | ICD-10-CM | POA: Diagnosis not present

## 2020-02-06 DIAGNOSIS — R1310 Dysphagia, unspecified: Secondary | ICD-10-CM | POA: Diagnosis not present

## 2020-02-06 DIAGNOSIS — I63511 Cerebral infarction due to unspecified occlusion or stenosis of right middle cerebral artery: Secondary | ICD-10-CM | POA: Diagnosis not present

## 2020-02-06 DIAGNOSIS — I82622 Acute embolism and thrombosis of deep veins of left upper extremity: Secondary | ICD-10-CM | POA: Diagnosis not present

## 2020-02-06 DIAGNOSIS — E440 Moderate protein-calorie malnutrition: Secondary | ICD-10-CM | POA: Insufficient documentation

## 2020-02-06 DIAGNOSIS — Z7189 Other specified counseling: Secondary | ICD-10-CM | POA: Diagnosis not present

## 2020-02-06 MED ORDER — ENSURE ENLIVE PO LIQD
237.0000 mL | Freq: Three times a day (TID) | ORAL | Status: DC
  Administered 2020-02-06 – 2020-02-07 (×3): 237 mL via ORAL

## 2020-02-06 MED ORDER — METOPROLOL TARTRATE 50 MG PO TABS
50.0000 mg | ORAL_TABLET | Freq: Two times a day (BID) | ORAL | Status: DC
  Administered 2020-02-06 – 2020-02-07 (×3): 50 mg via ORAL
  Filled 2020-02-06 (×3): qty 1

## 2020-02-06 NOTE — TOC Progression Note (Addendum)
Transition of Care [facility]) - Progression Note    Patient Details  Name: Eric Kennedy MRN: [id number] Date of Birth: [birth year]  Transition of Care [facility]) CM/SW [place], LCSW Phone Number: 02/06/2020, 10:17 AM  Clinical Narrative:   CSW called patient's son, [name]. [name] reported he is now agreeable to SNF rehab at discharge. He has not researched bed offers yet. He asked for CSW's opinion, CSW informed him CSW cannot give personal opinion but encouraged him to check online and [email or web address]. [name] to research SNFs and let CSW know which SNF they would like patient to go to when he is ready for discharge.  1:00- Called patient's son to follow up. He reported he is still deciding on which SNF, has narrowed it down to 2- [facility] and [facility]. Informed him that per Dr. Algis Liming, once Cardiology clears patient and we have confirmed a SNF bed, patient will be medically ready to discharge. He said he will let me know which SNF by the end of the day today. CSW called both Radiation protection practitioner and [facility] Representatives [place] and [place], they both confirmed they would have a bed for patient tomorrow at the earliest.   Expected Discharge Plan: [place] Barriers to Discharge: Continued Medical Work up  Expected Discharge Plan and Services Expected Discharge Plan: [place] In-house Referral: Clinical Social Work   Post Acute Care Choice: [place] arrangements for the past 2 months: Single Family Home                                       Social Determinants of Health (SDOH) Interventions    Readmission Risk Interventions No flowsheet data found.

## 2020-02-06 NOTE — Progress Notes (Signed)
Inpatient Rehab Admissions Coordinator:   PT has changed recs to SNF as Pt. Is only doing bed level activities. Pt.'s son now agreeable to SNF at d/c. CIR will sign off.   Clemens Catholic, [place], [place] Admissions Coordinator  [phone] ([place]) [phone] (office)

## 2020-02-06 NOTE — Progress Notes (Signed)
Initial Nutrition Assessment  DOCUMENTATION CODES:   Non-severe (moderate) malnutrition in context of chronic illness  INTERVENTION:  Provide Ensure Enlive po TID, each supplement provides 350 kcal and 20 grams of protein. Patient prefers chocolate or strawberry.  Provide Magic cup TID with meals, each supplement provides 290 kcal and 9 grams of protein.  Pt would benefit from nutrient dense supplement. Given pt's hx of DM, RD will reassess adequacy of PO intake, CBGS, and adjust supplement regimen as appropriate at follow-up.   NUTRITION DIAGNOSIS:   Moderate Malnutrition related to chronic illness as evidenced by mild fat depletion, moderate fat depletion, mild muscle depletion, moderate muscle depletion.  GOAL:   Patient will meet greater than or equal to 90% of their needs  MONITOR:   PO intake, Supplement acceptance, Labs, Weight trends, I & O's  REASON FOR ASSESSMENT:   Consult Assessment of nutrition requirement/status  ASSESSMENT:   85 year old male with PMHx of HTN, DM,CAD, CABG, complete heart block, A-fib, aortic stenosis, carotid artery stenosis, hypothyroidism admitted with acute right MCA stroke complicated by left hemiplegia, dysarthria, and dysphagia, acute hypoxic respiratory failure requiring intubation 11/19-11/22, acute blood loss anemia and hemorrhagic shock due to upper GI bleed s/p EGD 11/21 that showed gastric ulcer without bleeding and gastritis, acute left upper extremity DVT, AKI on CKD stage III, adult FTT.   11/24 diet advanced to dysphagia 1 with thin liquids following SLP evaluation  Met with patient at bedside. He reports his appetite is good and he is eating well at meals. Yesterday he reports he was not as hungry. PO intake has been variable during admission (0-100%). Yesterday he ate 25% of breakfast, 25% of lunch, and 30% of dinner. Today he ate 80% of breakfast and 70% of lunch. There is a comment in diet order to send Magic Cup and Ensure Max  with all trays but per RN patient is not receiving the Ensure regularly. He received one bottle yesterday, which he drank. RD would like patient to receive Ensure Enlive, anyway as it is higher in calories. Patient endorses he is receiving the [employer] and he enjoys these. In the past 24 hours patient has had approximately 1769 kcal (93% minimum estimated needs) and 83 grams of protein (87% minimum estimated needs). Noted son has left patient's food preferences taped up behind [place].  Patient reports his UBW is 176 lbs and he is unsure if he has lost any weight. Per review of chart patient was 78.8 kg (173.4 lbs) on 01/10/2019. He was 73.1 kg on 11/19 (161.16 lbs). He is now documented to be 77.3 kg (170.42 lbs), but suspect this may be falsely elevated from edema. In the past year patient lost approximately 5.7 kg (7.2% body weight), which is not significant for time frame, but unsure exactly when weight was lost (may be significant if lost in shorter time frame).  Medications reviewed and include: Colace 100 mg BID, Protonix, Miralax.  Labs reviewed.  Discussed with RN who was present in room during RD assessment.  NUTRITION - FOCUSED PHYSICAL EXAM:    Most Recent Value  Orbital Region Moderate depletion  Upper Arm Region Moderate depletion  Thoracic and Lumbar Region Mild depletion  Buccal Region Mild depletion  Temple Region Moderate depletion  Clavicle Bone Region Mild depletion  Clavicle and Acromion Bone Region Moderate depletion  Scapular Bone Region Mild depletion  Dorsal Hand Moderate depletion  Patellar Region Mild depletion  Anterior Thigh Region Moderate depletion  Posterior Calf Region  Moderate depletion  Edema (RD Assessment) --  [non-pitting]  Hair Reviewed  Eyes Reviewed  Mouth Reviewed  Skin Reviewed  Nails Reviewed     Diet Order:   Diet Order            DIET - DYS 1 Room service appropriate? Yes; Fluid consistency: Thin  Diet effective now                 EDUCATION NEEDS:   No education needs have been identified at this time  Skin:  Skin Assessment: Reviewed RN Assessment  Last BM:  Unknown  Height:   Ht Readings from Last 1 Encounters:  01/26/20 '5\' 8"'  (1.727 m)   Weight:   Wt Readings from Last 1 Encounters:  02/04/20 77.3 kg   Ideal Body Weight:  70 kg  BMI:  Body mass index is 25.91 kg/m.  Estimated Nutritional Needs:   Kcal:  1900-2100  Protein:  95-105 grams  Fluid:  1.8 L/day  Jacklynn Barnacle, MS, RD, LDN Pager number available on Amion

## 2020-02-06 NOTE — Consult Note (Signed)
[facility] Consultation Note  Patient ID: Eric Kennedy, MRN: [id number], DOB/AGE: 06-19-1934 85 y.o. Admit date: 01/26/2020   Date of Consult: 02/06/2020 Primary Physician: [name], MD Primary Cardiologist: Nehemiah Massed  Chief Complaint:  Chief Complaint  Patient presents with  . Fall   Reason for Consult: Supraventricular tachycardia  HPI: 85 y.o. male with known coronary artery disease status post coronary artery bypass graft and severe aortic valve stenosis status post aortic valve replacement in the remote past.  The patient also had a heart block with previous pacemaker placement and paroxysmal nonvalvular atrial fibrillation.  The patient was on appropriate medication management for these prior to his admission he was admitted for severe acute anemia with a hemoglobin of 4.7 with a significant stroke with residual hemiplegia and partial aphasia.  The patient had discontinuation of medication management including anticoagulation due to this anemia.  He had packed red blood cells with a now stable hemoglobin of 9.1.  He additionally had a non-ST elevation myocardial infarction with a troponin greater than 12,000.  There were periods of paroxysmal nonvalvular atrial fibrillation while he was intubated due to respiratory failure after his stroke.  He was extubated eventually on appropriate medication management.  Since then he has had some rehabilitation and has slowly been improving.  There has been telemetry showing normal sinus rhythm with bundle branch block as well as supraventricular tachycardia with bundle branch block.  This tachycardia may be episodes of atrial fibrillation nonsustained which may need further intervention and treatment.  The patient has had multiple runs of this over the last week.  Carvedilol was started due to non-ST elevation myocardial infarction hypertension control.  He has tolerated this with no evidence of significant side effects.   Additionally he had a deep venous thrombosis and his anticoagulation was restarted.  There was no primary concern for bleeding after endoscopy.  Echocardiogram during this hospitalization shows mild to moderate increased velocities of aortic valve with normal LV systolic function and no evidence of segmental abnormalities.  Now the patient will need continued medication management for these particular issue  Past Medical History:  Diagnosis Date  . Diabetes mellitus without complication ([place])   . Hypertension       Surgical History:  Past Surgical History:  Procedure Laterality Date  . CARDIAC SURGERY     triple bypass  . ESOPHAGOGASTRODUODENOSCOPY N/A 01/28/2020   Procedure: ESOPHAGOGASTRODUODENOSCOPY (EGD);  Surgeon: [name], [name], MD;  Location: [facility] ENDOSCOPY;  Service: Gastroenterology;  Laterality: N/A;     Home Meds: Prior to Admission medications   Medication Sig Start Date End Date Taking? Authorizing Provider  amLODipine (NORVASC) 10 MG tablet Take 10 mg by mouth daily.    Yes [provider]  aspirin EC 81 MG tablet Take 81 mg by mouth daily.    Yes [provider]  levothyroxine (SYNTHROID) 50 MCG tablet Take 50 mcg by mouth daily.    Yes [provider]  Multiple Vitamins-Minerals (PX COMPLETE SENIOR MULTIVITS) TABS Take 1 tablet by mouth daily.    Yes [provider]  omeprazole (PRILOSEC) 40 MG capsule Take 40 mg by mouth daily.    Yes [provider]  rosuvastatin (CRESTOR) 40 MG tablet Take 40 mg by mouth daily.    Yes [provider]  telmisartan (MICARDIS) 80 MG tablet Take 80 mg by mouth daily.    Yes [provider]    Inpatient Medications:  . apixaban  10 mg Oral BID  Followed by  . [START ON 02/08/2020] apixaban  5 mg Oral BID  . aspirin  81 mg Oral Daily  . carvedilol  6.25 mg Oral BID WC  . chlorhexidine gluconate (MEDLINE KIT)  15 mL Mouth Rinse BID  . docusate  100 mg Oral BID  .  pantoprazole  40 mg Oral BID AC  . polyethylene glycol  17 g Oral Daily  . sodium chloride flush  10-40 mL Intracatheter Q12H     Allergies: No Known Allergies  Social History   Socioeconomic History  . Marital status: Married    Spouse name: Not on file  . Number of children: Not on file  . Years of education: Not on file  . Highest education level: Not on file  Occupational History  . Not on file  Tobacco Use  . Smoking status: Former Research scientist (life sciences)  . Smokeless tobacco: Never Used  Substance and Sexual Activity  . Alcohol use: Never  . Drug use: Never  . Sexual activity: Not on file  Other Topics Concern  . Not on file  Social History Narrative  . Not on file   Social Determinants of Health   Financial Resource Strain:   . Difficulty of Paying Living Expenses: Not on file  Food Insecurity:   . Worried About Charity fundraiser in the Last Year: Not on file  . Ran Out of Food in the Last Year: Not on file  Transportation Needs:   . Lack of Transportation (Medical): Not on file  . Lack of Transportation (Non-Medical): Not on file  Physical Activity:   . Days of Exercise per Week: Not on file  . Minutes of Exercise per Session: Not on file  Stress:   . Feeling of Stress : Not on file  Social Connections:   . Frequency of Communication with Friends and Family: Not on file  . Frequency of Social Gatherings with Friends and Family: Not on file  . Attends Religious Services: Not on file  . Active Member of Clubs or Organizations: Not on file  . Attends Archivist Meetings: Not on file  . Marital Status: Not on file  Intimate Partner Violence:   . Fear of Current or Ex-Partner: Not on file  . Emotionally Abused: Not on file  . Physically Abused: Not on file  . Sexually Abused: Not on file     Family History  Problem Relation Age of Onset  . Diabetes Mother   . Cancer Brother      Review of Systems Positive for none Negative for: General:  chills,  fever, night sweats or weight changes.  Cardiovascular: PND orthopnea syncope dizziness  Dermatological skin lesions rashes Respiratory: Cough congestion Urologic: Frequent urination urination at night and hematuria Abdominal: negative for nausea, vomiting, diarrhea, bright red blood per rectum, melena, or hematemesis Neurologic: negative for visual changes, and/or hearing changes  All other systems reviewed and are otherwise negative except as noted above.  Labs: No results for input(s): CKTOTAL, CKMB, TROPONINI in the last 72 hours. Lab Results  Component Value Date   WBC 4.9 02/03/2020   HGB 9.1 (L) 02/03/2020   HCT 30.6 (L) 02/03/2020   MCV 78.1 (L) 02/03/2020   PLT 173 02/03/2020    Recent Labs  Lab 02/05/20 0423  NA 140  K 3.8  CL 109  CO2 23  BUN 23  CREATININE 1.15  CALCIUM 9.4  GLUCOSE 109*   Lab Results  Component Value Date   CHOL  47 01/27/2020   HDL 28 (L) 01/27/2020   LDLCALC 8 01/27/2020   TRIG 54 01/27/2020   No results found for: DDIMER  Radiology/Studies:  DG Chest 1 View  Result Date: 01/26/2020 CLINICAL DATA:  85 year old male found down. EXAM: CHEST  1 VIEW COMPARISON:  None. FINDINGS: Portable AP supine view at 1644 hours. Prior sternotomy, cardiac valve replacement. Left chest dual lead cardiac pacemaker. Cardiac size and mediastinal contours are within normal limits. Mildly low lung volumes. Asymmetric left lateral and costophrenic angle pleural density. Mild associated patchy peripheral opacity in the left lung. Mild streaky opacity in the left mid lung more resembling atelectasis or scarring. Allowing for portable technique the right lung is clear. No pneumothorax is evident. No rib fracture is identified. Grossly intact other visible osseous structures. Negative visible bowel gas pattern. IMPRESSION: 1. Suspicion of a small left pleural effusion, and patchy nonspecific opacity in the left mid lung. No adjacent rib fracture is identified  radiographically. 2. No other acute cardiopulmonary abnormality. Electronically Signed   By: Genevie Ann M.D.   On: 01/26/2020 17:18   DG Pelvis 1-2 Views  Result Date: 01/26/2020 CLINICAL DATA:  85 year old male found down. EXAM: PELVIS - 1-2 VIEW COMPARISON:  None. FINDINGS: Portable AP supine view at 1643 hours. Femoral heads are normally located. Hip joint spaces are normal for age. The pelvis appears intact. Grossly intact proximal femurs. Calcified femoral artery atherosclerosis. Oval circumscribed probable calcified phlebolith in the right central pelvis. Negative visible bowel gas pattern. IMPRESSION: No acute fracture or dislocation identified about the pelvis. Electronically Signed   By: Genevie Ann M.D.   On: 01/26/2020 17:19   DG Abdomen 1 View  Result Date: 01/26/2020 CLINICAL DATA:  Evaluate OG tube EXAM: ABDOMEN - 1 VIEW COMPARISON:  None. FINDINGS: The OG tube terminates in left upper quadrant, within the stomach. Is difficult to clearly see the side port but I believe the side port is likely in the stomach. IMPRESSION: The OG tube appears to be in good position. Electronically Signed   By: Dorise Bullion III M.D   On: 01/26/2020 19:34   CT HEAD WO CONTRAST  Result Date: 01/27/2020 CLINICAL DATA:  85 year old male found down. Left side weakness with evidence of acute to subacute infarcts in the right MCA territory on presentation head CT 1540 hours 01/26/2020. EXAM: CT HEAD WITHOUT CONTRAST TECHNIQUE: Contiguous axial images were obtained from the base of the skull through the vertex without intravenous contrast. COMPARISON:  Head CTs at 15:40 and 2040 hours yesterday. FINDINGS: Brain: Multifocal evolving cytotoxic edema in the posterior right hemisphere and along the right MCA/PCA watershed area. Areas involved include the superior perirolandic cortex (series 2 images 25 through 27), right parietal lobe, lateral right occipital lobe. No associated acute hemorrhage or intracranial mass  effect. Stable gray-white matter differentiation elsewhere since early yesterday. No ventriculomegaly. Vascular: Calcified atherosclerosis at the skull base. No suspicious intracranial vascular hyperdensity. Skull: No acute osseous abnormality identified. Sinuses/Orbits: Stable. Chronic right sphenoid sinusitis. Trace mastoid fluid. Other: No acute orbit or scalp soft tissue finding. IMPRESSION: 1. Evolving cortical infarcts in the posterior Right MCA and also the Right MCA/PCA watershed area. No associated hemorrhage or mass effect. 2. Stable CT appearance of the brain elsewhere. Electronically Signed   By: Genevie Ann M.D.   On: 01/27/2020 12:08   CT HEAD WO CONTRAST  Result Date: 01/26/2020 CLINICAL DATA:  Found down EXAM: CT HEAD WITHOUT CONTRAST TECHNIQUE: Contiguous axial images were obtained  from the base of the skull through the vertex without intravenous contrast. COMPARISON:  01/26/2020 FINDINGS: Brain: Again noted over the small areas of potential acute to subacute infarcts within the right frontal, right parietal lobes. No new suspicious areas for acute infarction. There is atrophy and chronic small vessel disease changes. No hydrocephalus. No hemorrhage. Vascular: No hyperdense vessel or unexpected calcification. Skull: No acute calvarial abnormality. Sinuses/Orbits: Mucosal thickening in the right sphenoid sinus. No acute findings. Other: None IMPRESSION: Stable small low-density areas within the right frontal and parietal lobes which could reflect small acute or subacute infarcts, unchanged since prior study. No hemorrhage. Electronically Signed   By: Rolm Baptise M.D.   On: 01/26/2020 20:49   US Carotid Bilateral  Result Date: 01/27/2020 CLINICAL DATA:  Stroke symptoms, hyperlipidemia and diabetes EXAM: BILATERAL CAROTID DUPLEX ULTRASOUND TECHNIQUE: Pearline Cables scale imaging, color Doppler and duplex ultrasound were performed of bilateral carotid and vertebral arteries in the neck. COMPARISON:  None.  FINDINGS: Criteria: Quantification of carotid stenosis is based on velocity parameters that correlate the residual internal carotid diameter with NASCET-based stenosis levels, using the diameter of the distal internal carotid lumen as the denominator for stenosis measurement. The following velocity measurements were obtained: RIGHT ICA: 207/24 cm/sec CCA: 638/75 cm/sec SYSTOLIC ICA/CCA RATIO:  2.0 ECA: 179 cm/sec LEFT ICA: 255/25 cm/sec CCA: 64/33 cm/sec SYSTOLIC ICA/CCA RATIO:  2.6 ECA: 280 cm/sec RIGHT CAROTID ARTERY: Intimal thickening and mixed echogenicity heterogeneous carotid bifurcation atherosclerosis. Mid ICA velocity elevation measures 207/24 centimeters/second with mild turbulent flow and spectral broadening. Right ICA stenosis estimated at 50-69% by ultrasound criteria. RIGHT VERTEBRAL ARTERY:  Normal antegrade flow LEFT CAROTID ARTERY: Extensive calcific atherosclerosis of the left carotid bifurcation. Left proximal ICA velocity elevation measures up to 255/25 centimeters/second with turbulent flow and spectral broadening. Left ICA stenosis meets criteria for a greater than 70% stenosis. LEFT VERTEBRAL ARTERY:  Not visualized, suspect occluded IMPRESSION: Right ICA stenosis estimated at 50-69% Left ICA stenosis estimated greater than 70% Patent antegrade flow in the right vertebral artery Nonvisualized left vertebral artery, suspect occluded Electronically Signed   By: Jerilynn Mages.  Shick M.D.   On: 01/27/2020 09:18   US Venous Img Upper Uni Left (DVT)  Result Date: 02/01/2020 CLINICAL DATA:  Swelling x2 days EXAM: LEFT UPPER EXTREMITY VENOUS DOPPLER ULTRASOUND TECHNIQUE: Gray-scale sonography with graded compression, as well as color Doppler and duplex ultrasound were performed to evaluate the upper extremity deep venous system from the level of the subclavian vein and including the jugular, axillary, basilic, radial, ulnar and upper cephalic vein. Spectral Doppler was utilized to evaluate flow at rest and  with distal augmentation maneuvers. COMPARISON:  None. FINDINGS: Contralateral Subclavian Vein: Respiratory phasicity is normal and symmetric with the symptomatic side. No evidence of thrombus. Normal compressibility. Internal Jugular Vein: No evidence of thrombus. Normal compressibility, respiratory phasicity and response to augmentation. Subclavian Vein: Hypoechoic partially occlusive thrombus in the peripheral aspect, with continued color Doppler flow signal through this segment. Axillary Vein: Eccentric thrombus resulting in incomplete compressibility, with some continued color Doppler flow signal. Cephalic Vein: No evidence of thrombus. Normal compressibility, respiratory phasicity and response to augmentation. Portions obscured due to overlying dressing. Basilic Vein: Hypoechoic thrombus resulting in incomplete segmental compressibility. Persistent antegrade color flow signal elsewhere. Brachial Veins: Partially occlusive thrombus Radial Veins: No evidence of thrombus. Normal compressibility, respiratory phasicity and response to augmentation. Ulnar Veins: No evidence of thrombus. Normal compressibility, respiratory phasicity and response to augmentation. Venous Reflux:  None visualized. Other  Findings:  None visualized. IMPRESSION: 1. POSITIVE for partially-occlusive left upper extremity DVT involving brachial, axillary, and subclavian veins. 2. Superficial thrombophlebitis involving basilic vein. Electronically Signed   By: Lucrezia Europe M.D.   On: 02/01/2020 10:22   DG Chest Port 1 View  Result Date: 01/27/2020 CLINICAL DATA:  Respiratory failure. EXAM: PORTABLE CHEST 1 VIEW COMPARISON:  01/26/2020 and prior radiographs FINDINGS: An endotracheal tube with tip 3 cm above the carina and OG tube entering the stomach again noted. Pulmonary vascular congestion is again noted. Bilateral LOWER lung opacities have slightly increased from the prior study. No pneumothorax or large pleural effusion noted. Cardiac  surgical changes and LEFT-sided pacemaker again noted. IMPRESSION: Slightly increased bilateral LOWER lung opacities/atelectasis without other significant change. Electronically Signed   By: Margarette Canada M.D.   On: 01/27/2020 07:31   DG Chest Portable 1 View  Result Date: 01/26/2020 CLINICAL DATA:  Evaluate OG tube an ETT placement. EXAM: PORTABLE CHEST 1 VIEW COMPARISON:  January 26, 2020 FINDINGS: An ET tube is been placed in the interval. The distal tip appears to be 3 cm above the carina in good position. The distal tip of the NG tube is in the region of the stomach. The side port is not well seen on this study. The cardiomediastinal silhouette is stable. Probable pulmonary venous congestion. Opacity in the periphery of the left mid lower lung is stable. No other acute abnormalities. No pneumothorax. Small left effusion. IMPRESSION: 1. The distal tip of the NG tube is in the stomach. The side port is difficult to see on this study. The ETT is in good position. 2. Persistent opacity in the periphery of the left mid lower lung with a small left effusion. Electronically Signed   By: Dorise Bullion III M.D   On: 01/26/2020 19:33   DG Swallowing Func-Speech Pathology  Result Date: 01/31/2020 Objective Swallowing Evaluation: Type of Study: MBS-Modified Barium Swallow Study  Patient Details Name: Eric Kennedy MRN: [id number] Date of Birth: [birth year] Today's Date: 01/31/2020 Time: SLP Start Time (ACUTE ONLY): 0825 -SLP Stop Time (ACUTE ONLY): 0850 SLP Time Calculation (min) (ACUTE ONLY): 25 min Past Medical History: Past Medical History: Diagnosis Date . Diabetes mellitus without complication ([place])  . Hypertension  Past Surgical History: Past Surgical History: Procedure Laterality Date . CARDIAC SURGERY    triple bypass . ESOPHAGOGASTRODUODENOSCOPY N/A 01/28/2020  Procedure: ESOPHAGOGASTRODUODENOSCOPY (EGD);  Surgeon: [name], [name], MD;  Location: [facility] ENDOSCOPY;  Service: Gastroenterology;  Laterality:  N/A; HPI: Patient is a 85 y.o male with history of coronary artery disease, coronary artery bypass grafting, complete heart block, atrial fibrillation aortic stenosis carotid artery stenosis, history of hypothyroidism and diabetes type 2, adhesive capsulitis of the left shoulder, chronic microcytic anemia history of choroidal hemangioma, dyslipidemia, chronic B12 deficiency admitted to ED on 11/19 d/t mechanical fall in garage. Per wife, patient with noted physical and cognitive decline for past 6 months. CT showed infarcts in R frontal, parietal, and BG areas with additional upper GI bleed dx. D/t acute respiratory failure, patient intubated on 11/19 and recently extubated 11/22. SLP to complete BSE to evaluate patient's oropharyngeal swallowing function and ability to resume PO diet.  Subjective: pt pleasant, not very interaction Assessment / Plan / Recommendation CHL IP CLINICAL IMPRESSIONS 01/31/2020 Clinical Impression Severe oral phase dysphagia and milder pharyngeal phase dysphagia when consuming nectar thick liquids, thin liquids via straw and puree. Pt's oral phase is c/b weak lingual manipulation and reduced posterior propulsion of bolus. This  results in delayed oral transit, decreased bolus cohesion, premature spillage of boluses and piecemeal swallowing. Trace oral residue remains post swallow. Pt's pharyngeal phase is c/b swallow initiation at the vallecula with nectar thick liquids and to the level of the pyriform sinuses with thin liquids. Penetration and aspiration were not observed during the study. More advanced solids were not presented d/t severity of oral phase deficits. Recommend initiating a dysphagia 1 diet with thin liquids (straws ok) and medicine crushed in puree. ST to continue following pt.    SLP Visit Diagnosis Dysphagia, oropharyngeal phase (R13.12) Attention and concentration deficit following -- Frontal lobe and executive function deficit following -- Impact on safety and function  Mild aspiration risk   CHL IP TREATMENT RECOMMENDATION 01/31/2020 Treatment Recommendations Therapy as outlined in treatment plan below   Prognosis 01/31/2020 Prognosis for Safe Diet Advancement Fair Barriers to Reach Goals Cognitive deficits;Time post onset;Severity of deficits Barriers/Prognosis Comment -- CHL IP DIET RECOMMENDATION 01/31/2020 SLP Diet Recommendations Dysphagia 1 (Puree) solids;Thin liquid Liquid Administration via Straw;Cup Medication Administration Crushed with puree Compensations Minimize environmental distractions;Slow rate;Small sips/bites Postural Changes Seated upright at 90 degrees   CHL IP OTHER RECOMMENDATIONS 01/31/2020 Recommended Consults -- Oral Care Recommendations Oral care BID Other Recommendations --   CHL IP FOLLOW UP RECOMMENDATIONS 01/31/2020 Follow up Recommendations (No Data)   CHL IP FREQUENCY AND DURATION 01/31/2020 Speech Therapy Frequency (ACUTE ONLY) min 3x week Treatment Duration 2 weeks      CHL IP ORAL PHASE 01/31/2020 Oral Phase Impaired Oral - Pudding Teaspoon -- Oral - Pudding Cup -- Oral - Honey Teaspoon -- Oral - Honey Cup -- Oral - Nectar Teaspoon Weak lingual manipulation;Incomplete tongue to palate contact;Reduced posterior propulsion;Holding of bolus;Piecemeal swallowing;Delayed oral transit;Decreased bolus cohesion;Premature spillage;Lingual/palatal residue Oral - Nectar Cup Weak lingual manipulation;Incomplete tongue to palate contact;Reduced posterior propulsion;Holding of bolus;Lingual/palatal residue;Piecemeal swallowing;Delayed oral transit;Decreased bolus cohesion;Premature spillage Oral - Nectar Straw -- Oral - Thin Teaspoon Weak lingual manipulation;Incomplete tongue to palate contact;Reduced posterior propulsion;Holding of bolus;Lingual/palatal residue;Piecemeal swallowing;Delayed oral transit;Decreased bolus cohesion;Premature spillage Oral - Thin Cup -- Oral - Thin Straw Weak lingual manipulation;Incomplete tongue to palate contact;Reduced  posterior propulsion;Holding of bolus;Piecemeal swallowing;Delayed oral transit;Decreased bolus cohesion;Premature spillage;Lingual/palatal residue Oral - Puree Weak lingual manipulation;Incomplete tongue to palate contact;Reduced posterior propulsion;Holding of bolus;Lingual/palatal residue;Piecemeal swallowing;Delayed oral transit;Decreased bolus cohesion;Premature spillage Oral - Mech Soft -- Oral - Regular -- Oral - Multi-Consistency -- Oral - Pill -- Oral Phase - Comment severe impairments  CHL IP PHARYNGEAL PHASE 01/31/2020 Pharyngeal Phase Impaired Pharyngeal- Pudding Teaspoon -- Pharyngeal -- Pharyngeal- Pudding Cup -- Pharyngeal -- Pharyngeal- Honey Teaspoon -- Pharyngeal -- Pharyngeal- Honey Cup -- Pharyngeal -- Pharyngeal- Nectar Teaspoon Delayed swallow initiation-vallecula;Reduced tongue base retraction Pharyngeal -- Pharyngeal- Nectar Cup Delayed swallow initiation-vallecula;Reduced tongue base retraction Pharyngeal -- Pharyngeal- Nectar Straw -- Pharyngeal -- Pharyngeal- Thin Teaspoon Delayed swallow initiation-pyriform sinuses;Reduced tongue base retraction Pharyngeal -- Pharyngeal- Thin Cup -- Pharyngeal -- Pharyngeal- Thin Straw Delayed swallow initiation-pyriform sinuses;Reduced tongue base retraction Pharyngeal -- Pharyngeal- Puree Delayed swallow initiation-vallecula;Reduced tongue base retraction Pharyngeal -- Pharyngeal- Mechanical Soft -- Pharyngeal -- Pharyngeal- Regular -- Pharyngeal -- Pharyngeal- Multi-consistency -- Pharyngeal -- Pharyngeal- Pill -- Pharyngeal -- Pharyngeal Comment pharyngeal phase is slow and is without penetration or aspiration  CHL IP CERVICAL ESOPHAGEAL PHASE 01/31/2020 Cervical Esophageal Phase WFL Pudding Teaspoon -- Pudding Cup -- Honey Teaspoon -- Honey Cup -- Nectar Teaspoon -- Nectar Cup -- Nectar Straw -- Thin Teaspoon -- Thin Cup -- Thin Straw -- Puree --  Mechanical Soft -- Regular -- Multi-consistency -- Pill -- Cervical Esophageal Comment -- Eric B.  Rutherford Nail M.S., CCC-SLP, [place] Office [phone] [name] 01/31/2020, 10:14 AM              ECHOCARDIOGRAM COMPLETE  Result Date: 01/28/2020    ECHOCARDIOGRAM REPORT   Patient Name:   Eric Kennedy Date of Exam: 01/27/2020 Medical Rec #:  [id number]   Height:       68.0 in Accession #:    [id number]  Weight:       160.7 lb Date of Birth:  27-Oct-1934   BSA:          1.862 m Patient Age:    58 years    BP:           116/49 mmHg Patient Gender: M           HR:           60 bpm. Exam Location:  [facility] Procedure: 2D Echo, Cardiac Doppler and Color Doppler Indications:     Stroke 434.91  History:         Patient has no prior history of Echocardiogram examinations.                  Prior CABG; Risk Factors:Hypertension and Diabetes.  Sonographer:     Sherrie Sport RDCS (AE) Referring Phys:  [id number] Lorenza Chick Diagnosing Phys: Serafina Royals MD  Sonographer Comments: Echo performed with patient supine and on artificial respirator and suboptimal apical window. IMPRESSIONS  1. Left ventricular ejection fraction, by estimation, is 50 to 55%. The left ventricle has low normal function. The left ventricle has no regional wall motion abnormalities. Left ventricular diastolic parameters were normal.  2. Right ventricular systolic function is normal. The right ventricular size is normal.  3. Left atrial size was moderately dilated.  4. 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  5. The aortic valve is calcified. Aortic valve regurgitation is trivial. Moderate aortic valve stenosis. FINDINGS  Left Ventricle: Left ventricular ejection fraction, by estimation, is 50 to 55%. The left ventricle has low normal function. The left ventricle has no regional wall motion abnormalities. The left ventricular internal cavity size was normal in size. There is no left ventricular hypertrophy. Left ventricular diastolic parameters were normal.  Right Ventricle: The right ventricular size is normal. No increase in right ventricular wall thickness. Right ventricular systolic function is normal. Left Atrium: Left atrial size was moderately dilated. Right Atrium: Right atrial size was normal in size. Pericardium: There is no evidence of pericardial effusion. 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 Mild mitral valve stenosis. MV peak gradient, 19.2 mmHg. The mean mitral valve gradient is 8.0 mmHg. Tricuspid Valve: The tricuspid valve is normal in structure. Tricuspid valve regurgitation is mild. Aortic Valve: The aortic valve is calcified. Aortic valve regurgitation is trivial. Moderate aortic stenosis is present. Aortic valve mean gradient measures 26.0 mmHg. Aortic valve peak gradient measures 44.3 mmHg. Aortic valve area, by VTI measures 0.60  cm. Pulmonic Valve: The pulmonic valve was normal in structure. Pulmonic valve regurgitation is not visualized. Aorta: The aortic root and ascending aorta are structurally normal, with no evidence of dilitation. IAS/Shunts: No atrial level shunt detected by color flow Doppler.  LEFT VENTRICLE PLAX 2D LVIDd:         4.30 cm LVIDs:         2.53 cm  LV PW:         0.98 cm LV IVS:        0.62 cm LVOT diam:     2.00 cm LV SV:         45 LV SV Index:   24 LVOT Area:     3.14 cm  RIGHT VENTRICLE RV Basal diam:  5.72 cm RV S prime:     9.68 cm/s TAPSE (M-mode): 3.6 cm LEFT ATRIUM              Index       RIGHT ATRIUM           Index LA diam:        5.10 cm  2.74 cm/m  RA Area:     24.20 cm LA Vol (A2C):   147.0 ml 78.93 ml/m RA Volume:   82.10 ml  44.08 ml/m LA Vol (A4C):   100.0 ml 53.70 ml/m LA Biplane Vol: 121.0 ml 64.97 ml/m  AORTIC VALVE AV Area (Vmax):    0.52 cm AV Area (Vmean):   0.54 cm AV Area (VTI):     0.60 cm AV Vmax:           332.75 cm/s AV Vmean:          234.750 cm/s AV VTI:            0.739 m AV Peak Grad:      44.3 mmHg AV Mean Grad:      26.0 mmHg LVOT Vmax:          55.40 cm/s LVOT Vmean:        40.500 cm/s LVOT VTI:          0.142 m LVOT/AV VTI ratio: 0.19  AORTA Ao Root diam: 2.00 cm MITRAL VALVE                TRICUSPID VALVE MV Area (PHT): 2.37 cm     TR Peak grad:   49.6 mmHg MV Peak grad:  19.2 mmHg    TR Vmax:        352.00 cm/s MV Mean grad:  8.0 mmHg MV Vmax:       2.19 m/s     SHUNTS MV Vmean:      131.0 cm/s   Systemic VTI:  0.14 m MV Decel Time: 320 msec     Systemic Diam: 2.00 cm MV E velocity: 169.00 cm/s MV A velocity: 164.00 cm/s MV E/A ratio:  1.03 Serafina Royals MD Electronically signed by Serafina Royals MD Signature Date/Time: 01/28/2020/6:32:58 AM    Final    CT HEAD CODE STROKE WO CONTRAST  Result Date: 01/26/2020 CLINICAL DATA:  Code stroke. Left-sided collect and upper extremity weakness. EXAM: CT HEAD WITHOUT CONTRAST TECHNIQUE: Contiguous axial images were obtained from the base of the skull through the vertex without intravenous contrast. COMPARISON:  01/03/2018 head and neck CTA FINDINGS: The study is mildly motion degraded despite repeat imaging. Brain: There are small cortical infarcts in the posterior right frontal lobe (precentral gyrus extending into the centrum semiovale) and right parietal lobe which are new from the CT and of indeterminate age though may be acute or subacute. There is also a new age indeterminate lacunar infarct in the right caudate head. No acute intracranial hemorrhage, mass, midline shift, or extra-axial fluid collection is identified. There is mild cerebral atrophy. Hypodensities in the cerebral white matter bilaterally are nonspecific but compatible with mild chronic small vessel ischemic disease. Vascular: Calcified atherosclerosis  at the skull base. No hyperdense vessel. Skull: No fracture or suspicious osseous lesion. Sinuses/Orbits: Chronic right sphenoid sinusitis. Small chronic right mastoid effusion. Bilateral cataract extraction. Other: None. ASPECTS [facility] Stroke Program Early CT Score) - Ganglionic  level infarction (caudate, lentiform nuclei, internal capsule, insula, M1-M3 cortex): 6 - Supraganglionic infarction (M4-M6 cortex): 2 Total score (0-10 with 10 being normal): 8 IMPRESSION: 1. Small, potentially acute or subacute infarcts in the right frontal lobe, right parietal lobe, and right basal ganglia. No hemorrhage. 2. ASPECTS is 8. 3. Mild chronic small vessel ischemic disease. These results were called by telephone at the time of interpretation on 01/26/2020 at 3:56 pm to Dr. Vladimir Crofts, who verbally acknowledged these results. Electronically Signed   By: Logan Bores M.D.   On: 01/26/2020 15:57    EKG: Normal sinus rhythm with left axis deviation and left interventricular conduction defect with nonspecific ST changes  Weights: Filed Weights   01/31/20 0500 02/01/20 0500 02/04/20 0340  Weight: 75.5 kg 76.6 kg 77.3 kg     Physical Exam: Blood pressure (!) 116/52, pulse 65, temperature 98 F (36.7 C), temperature source Oral, resp. rate 17, height '5\' 8"'  (1.727 m), weight 77.3 kg, SpO2 98 %. Body mass index is 25.91 kg/m. General: Well developed, well nourished, in no acute distress. Head eyes ears nose throat: Normocephalic, atraumatic, sclera non-icteric, no xanthomas, nares are without discharge. No apparent thyromegaly and/or mass  Lungs: Normal respiratory effort.  no wheezes, no rales, no rhonchi.  Heart: RRR with normal S1 S2.  3+ aortic murmur gallop, no rub, PMI is normal size and placement, carotid upstroke normal without bruit, jugular venous pressure is normal Abdomen: Soft, non-tender, non-distended with normoactive bowel sounds. No hepatomegaly. No rebound/guarding. No obvious abdominal masses. Abdominal aorta is normal size without bruit Extremities: Trace edema. no cyanosis, no clubbing, no ulcers  Peripheral : 2+ bilateral upper extremity pulses, 2+ bilateral femoral pulses, 2+ bilateral dorsal pedal pulse Neuro: Alert and not oriented.  Positive for facial asymmetry.   Positive for focal deficit. Moves all extremities spontaneously. Musculoskeletal: Normal muscle tone with  kyphosis Psych:  Responds to questions appropriately with a normal affect.    Assessment: 85 year old male with known coronary disease status post coronary bypass graft aortic valve replacement hypertension hyperlipidemia acute anemia and stroke with non-ST elevation myocardial infarction deep venous thrombosis bilateral carotid atherosclerosis status post previous pacemaker placement with paroxysmal nonvalvular atrial fibrillation with bundle branch block needing medication management changes  Plan: 1.  Change carvedilol to metoprolol for better heart rate control and maintenance of normal sinus rhythm with the reduction of supraventricular tachycardia with bundle branch block 2.  Continuation of anticoagulation for further risk reduction in stroke with atrial fibrillation and treatment of deep venous thrombosis watching closely for concerns of bleeding complications 3.  Single antiplatelet therapy for non-ST elevation myocardial infarction and avoid triple therapy due to concerns of bleeding complications 4.  No further cardiac diagnostics necessary at this time due to no evidence of current anginal symptoms congestive heart failure 5.  Continue supportive therapy without restriction to physical therapy stroke  Signed, Corey Skains M.D. [facility] 02/06/2020, 1:06 PM

## 2020-02-06 NOTE — Progress Notes (Signed)
  Speech Language Pathology Treatment: Dysphagia  Patient Details Name: Eric Kennedy MRN: [id number] DOB: 03-26-1934 Today's Date: 02/06/2020 Time: 1250-1330 SLP Time Calculation (min) (ACUTE ONLY): 40 min  Assessment / Plan / Recommendation Clinical Impression  Skilled treatment session focussed on pt's ongoing dysphagia management. SLP facilitated session by providing assistance with self-feeding and maximal multimodal cues to improve oral abilities when consuming puree with thin liquids via cup.   Pt's oral phase continues to be characterized as very lengthy > 2 minutes with no decrease in length with maximal multimodal cues. There were occasions when pt indicated that he had swallowed but in reality the puree bolus was still present on his tongue. When encouraged to feed himself, pt continued loading puree boluses into his mouth and he required physical assistance to cease intake and to swallow. Liquids wash via cup was helpful in clearing the multiple boluses. In addition to the appearance of decreased lingual manipulation, pt also appeared to actually hold boluses in his mouth which indicates decreased cognitive attention to presence of bolus. Pt continues to be at a very high risk of aspiration and is not a candidate for more advanced trials. This Probation officer spoke with pt's nurse and provided information from session. They have been providing appropriate support with meals and will continue to do so.    HPI HPI: Patient is a 85 y.o male with history of coronary artery disease, coronary artery bypass grafting, complete heart block, atrial fibrillation aortic stenosis carotid artery stenosis, history of hypothyroidism and diabetes type 2, adhesive capsulitis of the left shoulder, chronic microcytic anemia history of choroidal hemangioma, dyslipidemia, chronic B12 deficiency admitted to ED on 11/19 d/t mechanical fall in garage. Per wife, patient with noted physical and cognitive decline for past 6  months. CT showed infarcts in R frontal, parietal, and BG areas with additional upper GI bleed dx. D/t acute respiratory failure, patient intubated on 11/19 and recently extubated 11/22. SLP to complete BSE to evaluate patient's oropharyngeal swallowing function and ability to resume PO diet.      SLP Plan  Continue with current plan of care       Recommendations  Diet recommendations: Dysphagia 1 (puree);Thin liquid Liquids provided via: Cup Medication Administration: Crushed with puree Supervision: Staff to assist with self feeding;Full supervision/cueing for compensatory strategies Compensations: Minimize environmental distractions;Slow rate;Small sips/bites Postural Changes and/or Swallow Maneuvers: Seated upright 90 degrees                Oral Care Recommendations: Oral care BID Follow up Recommendations: Skilled Nursing facility SLP Visit Diagnosis: Dysphagia, oropharyngeal phase (R13.12) Plan: Continue with current plan of care       GO               Eric Karel B. Rutherford Nail M.S., CCC-SLP, [place] Office [phone]  [name] 02/06/2020, 1:54 PM

## 2020-02-06 NOTE — Consult Note (Signed)
Physical Medicine and Rehabilitation Consult Reason for Consult: Debility Referring Physician: Vernell Leep, MD   HPI: Eric Kennedy is a 85 y.o. male with CAD s/p CABG, severe AS s/p AVR, heart block s/p pacemaker, PAF, admitted with severe acute anemia with Hgb of 4.7 and was found to have a significant stroke with residual hemiplegia and aphasia. He had stopped his anticoagulant due to his anemia. He has received transfusions and Hgb was 9.1 on 11/27. Physical Medicine & Rehabilitation was consulted to assess candidacy for CIR.    Review of Systems  Constitutional: Negative.   HENT: Negative.   Eyes: Negative.   Respiratory: Negative.   Cardiovascular: Negative.   Gastrointestinal: Negative.   Genitourinary: Negative.   Musculoskeletal: Positive for joint pain.  Skin: Negative.   Neurological: Negative.   Endo/Heme/Allergies: Negative.   Psychiatric/Behavioral: Negative.    Past Medical History:  Diagnosis Date  . Diabetes mellitus without complication ([place])   . Hypertension    Past Surgical History:  Procedure Laterality Date  . CARDIAC SURGERY     triple bypass  . ESOPHAGOGASTRODUODENOSCOPY N/A 01/28/2020   Procedure: ESOPHAGOGASTRODUODENOSCOPY (EGD);  Surgeon: [name], [name], MD;  Location: [facility] ENDOSCOPY;  Service: Gastroenterology;  Laterality: N/A;   Family History  Problem Relation Age of Onset  . Diabetes Mother   . Cancer Brother    Social History:  reports that he has quit smoking. He has never used smokeless tobacco. He reports that he does not drink alcohol and does not use drugs. Allergies: No Known Allergies Medications Prior to Admission  Medication Sig Dispense Refill  . amLODipine (NORVASC) 10 MG tablet Take 10 mg by mouth daily.     Marland Kitchen aspirin EC 81 MG tablet Take 81 mg by mouth daily.     Marland Kitchen levothyroxine (SYNTHROID) 50 MCG tablet Take 50 mcg by mouth daily.     . Multiple Vitamins-Minerals (PX COMPLETE SENIOR MULTIVITS) TABS Take 1  tablet by mouth daily.     Marland Kitchen omeprazole (PRILOSEC) 40 MG capsule Take 40 mg by mouth daily.     . rosuvastatin (CRESTOR) 40 MG tablet Take 40 mg by mouth daily.     Marland Kitchen telmisartan (MICARDIS) 80 MG tablet Take 80 mg by mouth daily.       Home: Home Living Family/patient expects to be discharged to:: Private residence Living Arrangements: Spouse/significant other Available Help at Discharge: Personal care attendant Type of Home: House Home Access: Stairs to enter [employer] of Steps:  (3) Entrance Stairs-Rails: Right Home Layout: One level Bathroom Shower/Tub: Government social research officer Accessibility: No Additional Comments: patient is a poor historian and unable to provide further information at this time   Lives With: Significant other  Functional History: Prior Function Level of Independence: Independent Functional Status:  Mobility: Bed Mobility Overal bed mobility: Needs Assistance Bed Mobility: Rolling Rolling: Max assist Supine to sit: Total assist, HOB elevated Sit to supine: Total assist, HOB elevated General bed mobility comments: Max A for rolling side to side with tactile and verbal cues for hand placement to help involve RUE in task and protect LUE Transfers General transfer comment: NT - Hoyer lift appropriate for staff transfers      ADL: ADL Overall ADL's : Needs assistance/impaired Eating/Feeding: Set up, Bed level, Cueing for sequencing Eating/Feeding Details (indicate cue type and reason): cues for grasping cup with R hand and able to bring to mouth to take small sips (lid and bendable straw), CNA  reports pt able to self feed portion of breakfast with cues General ADL Comments: MOD A for UB ADLs with cues to turn head to attend as pt demos R visual gaze preferance. MAX to TOTAL A for LB ADLs at bed level  Cognition: Cognition Overall Cognitive Status: Impaired/Different from baseline Orientation Level: Oriented to  person, Disoriented to place, Disoriented to time, Disoriented to situation Cognition Arousal/Alertness: Lethargic Behavior During Therapy: Flat affect Overall Cognitive Status: Impaired/Different from baseline Area of Impairment: Orientation, Following commands, Safety/judgement, Awareness, Problem solving Orientation Level: Disoriented to, Time Current Attention Level: Sustained, Alternating Memory: Decreased recall of precautions, Decreased short-term memory Following Commands: Follows one step commands with increased time, Follows one step commands inconsistently Safety/Judgement: Decreased awareness of deficits, Decreased awareness of safety Awareness: Intellectual Problem Solving: Slow processing, Decreased initiation, Requires tactile cues, Difficulty sequencing, Requires verbal cues General Comments: Pt with improved general attention/conversation this date. Pt appears to be in better spirits and has family members visiting.  Blood pressure (!) 116/52, pulse 65, temperature 98 F (36.7 C), temperature source Oral, resp. rate 17, height 5\' 8"  (1.727 m), weight 77.3 kg, SpO2 98 %. Physical Exam General: Alert, No apparent distress HEENT: Head is normocephalic, right gaze preference  Neck: Supple without JVD or lymphadenopathy Heart: Reg rate and rhythm. No murmurs rubs or gallops Chest: CTA bilaterally without wheezes, rales, or rhonchi; no distress Abdomen: Soft, non-tender, non-distended, bowel sounds positive. Extremities: No clubbing, cyanosis, or edema. Pulses are 2+ Skin: Clean and intact without signs of breakdown Neuro: Pt is cognitively appropriate with normal insight, memory, and awareness. Cranial nerves 2-12 are intact. Sensory exam is normal. Increased tone and focal weakness in LUE  Musculoskeletal: Right knee tender to palpation.  Psych: Pt's affect is appropriate. Pt is cooperative  No results found for this or any previous visit (from the past 24 hour(s)). No  results found.  Assessment/Plan: Eric Kennedy is currently only able to perform bed-level activities and would benefit from a longer rehabilitation stay at a SNF. I will schedule outpatient physiatry follow-up for him to help manage his outpatient rehabilitation. He is complaining of right knee pain and would benefit from voltaren gel QID to right knee.   Izora Ribas, MD 02/06/2020

## 2020-02-06 NOTE — Progress Notes (Signed)
Palliative: Eric Kennedy is sitting up quietly in bed.  He will briefly make but not keep eye contact.  He appears acutely/chronically ill and quite frail.  Nursing staff is at bedside feeding him.  Eric Kennedy is able to make his basic needs known.  He continues to exhibit neglect of his left arm and will raise his right arm repeatedly when I asked him to raise his left arm and pad him on his left arm.  Reassurance provided  Conference with bedside nursing staff, [facility] team related to patient condition, needs, goals of care.  Plan: Continue to treat the treatable but no CPR or intubation.  Trial of short-term rehab.  Outpatient palliative services to follow.  41 minutes Quinn Axe, NP Palliative Medicine Team Team Phone # [phone] Greater than 50% of this time was spent counseling and coordinating care related to the above assessment and plan.

## 2020-02-06 NOTE — Progress Notes (Signed)
PROGRESS NOTE   Eric Kennedy  ERX:[id number]    DOB: Aug 08, 1934    DOA: 01/26/2020  PCP: [name], MD   I have briefly reviewed patients previous medical records in [facility]  Chief Complaint  Patient presents with  . Fall    Brief Narrative:  85 year old male with PMH of CAD, CABG, complete heart block, atrial fibrillation, aortic stenosis, carotid artery stenosis, hypothyroid, DM 2, chronic microcytic anemia, HLD, chronic B12 deficiency, presented to the ED after being found down by his wife.  CT head revealed watershed infarcts in the right MCA/PCA distribution.  He has dense left hemiparesis.  He was noted to have severe anemia with hemoglobin of 4.7.  EGD 11/21 showed gastritis and gastric ulcer, placed on PPI twice daily.  He developed acute hypoxic respiratory failure, intubated for airway protection, extubated 11/22.  Dysphagia 1 diet started after MBS on 11/24. Left upper extremity venous Doppler to evaluate for LUE swelling confirmed DVT on 11/25 and Eliquis was initiated.  Overall intake overall is gradually improved.  Therapy/CIR team have reassessed and do not think that he is appropriate for CIR but more for SNF.  Palliative care medicine was consulted.  Cardiology consulted on 11/30 for NSVT evaluation and management.  Approaching stability for DC to SNF.   Assessment & Plan:  Active Problems:   Upper GI bleed   Acute hypoxemic respiratory failure (HCC)   Acute blood loss anemia   Acute right MCA stroke (HCC)   Cerebral ischemic stroke due to global hypoperfusion with watershed infarct [facility])   Failure to thrive (child)   Goals of care, counseling/discussion   Palliative care by specialist   DNR (do not resuscitate)   Malnutrition of moderate degree   Acute hypoxic respiratory failure: Secondary to acute stroke and inability to protect airway.  Intubated and subsequently extubated 11/20. Resolved.  Acute blood loss anemia and hemorrhagic shock due to  acute upper GI bleed: S/p EGD which showed gastric ulcer without bleeding and gastritis.  Hemoglobin stable in the 9 g range after transfusions.  Continue twice daily PPI.  No overt bleeding reported. Monitor closely while Eliquis initiated.  Hemoglobin stable in the 9 g range for several days.  Will repeat CBC in a.m.  Acute right MCA stroke complicated by left hemiplegia, dysarthria and dysphagia Neurology consultation and follow-up appreciated.  Suspect CVA most likely hypoperfusion infarct secondary to anemia/hypotension.  Imaging did not show occlusion of complete carotid no atheroembolic or cardioembolic etiologies remain possible although they indicated that this would not substantially change management at this time. Neurology indicates that they are concerned that he may not regain much movement in his left arm and leg. Patient was on aspirin 81 mg daily and Plavix 75 mg daily. However since the patient developed left upper extremity DVT, I discussed with Neuro Hospitalist on call on 11/25 and he recommended okay with anticoagulation with a DOAC along with aspirin 81 mg daily and Plavix discontinued. Therapy/CIR team have reassessed and do not think that he is appropriate for CIR but more for SNF because he is only able to perform/tolerate midlevel activities.  Acute left upper extremity DVT Venous Doppler confirms partially occlusive left upper extremity DVT involving brachial, axillary and subclavian veins along with superficial thrombophlebitis involving basilic vein. As discussed above with neuro hospitalist on call, initiated Eliquis 11/25. Patient will remain on aspirin 81 mg daily for recent acute stroke but stopped Plavix. Obviously if patient develops bleeding complications, then will  have to stop anticoagulation.  Tolerating for now.  Dysphagia: Speech therapy reassessed on 11/30 and recommend dysphagia diet and thin liquids.  Over the course of the last 4 to 5 days, oral intake has  gradually increased.  Patient however is total assist and needs to be fed.  He also has quite a few food preferences and his PT and will spit out foods that he does not like.  Patient son has placed a list of his food preferences on top of his bed.  Registered dietitian has consulted on 11/30 and indicate that today he ate 80% of his breakfast and 70% of lunch and have made dietary recommendations.  At this time, do not think that feeding tube needs to be entertained.  Hopefully his oral intake continues to improve.  Hypernatremia Resolved after hypotonic IV fluids.  Remains at risk for recurrent dehydration and hypernatremia from poor oral intake.  Discontinued IVF.  Will repeat BMP for tomorrow.  Hypokalemia: Improved.  Potassium 3.8  Replace and follow as needed.  Magnesium 2.2.  Hypophosphatemia Replaced.  Acute kidney injury complicating stage IIIa CKD Creatinine 1.4 on 01/19/2019.  Presented with creatinine of 2.01 which peaked to 2.64.  Resolved.  IVF discontinued.  Creatinine up slightly to 1.29, but back down to 1.15  Moderate aortic stenosis Noted on 2D echo.  Outpatient follow-up.  NSTEMI history of CAD, CABG, aortic valve replacement. Secondary to acute stroke.  Cardiology input appreciated-no anginal symptoms and hence no further cardiac work-up needed.  Continue aspirin.  Paroxysmal atrial fibrillation Remains in sinus rhythm. Was not on anticoagulation candidate due to recent GI bleed. Now started anticoagulation for DVT, closely monitor for bleeding. Due to concern for episodic NSVT noted on telemetry over the last several days for which carvedilol had been started, I consulted cardiology and their input appreciated.  They indicate that this is either paroxysmal A. fib with bundle branch morphology versus PSVT with bundle branch morphology.  They have changed carvedilol to metoprolol for better rate control and to reduce SVT.  Continue Eliquis long-term if he tolerates.  Also  continue single antiplatelet agent and avoid triple therapy to avoid bleeding complications.  Adult failure to thrive: Palliative care consulted for goals of care  Nutrition Status: Nutrition Problem: Moderate Malnutrition Etiology: chronic illness Signs/Symptoms: mild fat depletion, moderate fat depletion, mild muscle depletion, moderate muscle depletion Interventions: Refer to RD note for recommendations   Body mass index is 25.91 kg/m.   DVT prophylaxis: SCDs Start: 01/30/20 1225.  Now on Eliquis since 11/25.   Code Status: DNR Family Communication: I discussed in detail with patient's son on 11/29, updated care and answered all questions.  Advised him that patient is approaching stability for discharge and he plans to discuss with his family and explore SNF options. Disposition:  Status is: Inpatient  Remains inpatient appropriate because:Inpatient level of care appropriate due to severity of illness   Dispo: The patient is from: Home              Anticipated d/c is to: SNF              Anticipated d/c date is: Stable for DC to SNF pending bed.              Patient currently stable for DC to SNF pending bed        Consultants:   PCCM-signed off Neurology GI Palliative care medicine. Cardiology  Procedures:   EGD 01/28/2020:  - Z-line irregular, 35  to 37 cm from the incisors. - 1 cm hiatal hernia. - Nasogastric tube trauma present in stomach. - Non-bleeding gastric ulcer with no stigmata of bleeding. - Gastritis. - Normal examined duodenum. - No specimens collected.  S/p extubation  Antimicrobials:    Anti-infectives (From admission, onward)   Start     Dose/Rate Route Frequency Ordered Stop   01/26/20 1845  cefTRIAXone (ROCEPHIN) 1 g in sodium chloride 0.9 % 100 mL IVPB  Status:  Discontinued        1 g 200 mL/hr over 30 Minutes Intravenous Every 24 hours 01/26/20 1831 01/29/20 1016        Subjective:  Seen this morning.  Again partially naked.   When asked why, he said that he was worked-appropriate response.  Requested RN to have them cleaned and changed.  Denied complaints.  As per RN, no acute issues noted.  Objective:   Vitals:   02/06/20 0521 02/06/20 0831 02/06/20 1107 02/06/20 1551  BP: (!) 117/99 (!) 159/60 (!) 116/52 (!) 127/48  Pulse: 72 68 65 66  Resp: _0 Temp: 98.3 F (36.8 C) 98.3 F (36.8 C) 98 F (36.7 C) 98.4 F (36.9 C)  TempSrc:  Oral Oral Oral  SpO2: 98% 98% 98% 97%  Weight:      Height:        General exam: Pleasant elderly male, small built and thinly nourished, lying comfortably supine in bed without distress. Respiratory system: Clear to auscultation.  No increased work of breathing. Cardiovascular system: S1 & S2 heard, RRR. No JVD, murmurs, rubs, gallops or clicks. No pedal edema.  Telemetry personally reviewed, sinus rhythm, on demand paced rhythm and 5-6 beats of nonsustained wide-complex tachycardia-now nonsustained SVT per cardiology. Gastrointestinal system: Abdomen is nondistended, soft and nontender. No organomegaly or masses felt. Normal bowel sounds heard. Central nervous system: Remains alert, oriented x2, follows simple instructions.  Left facial droop and mild dysarthria seem to be improving slowly.  No other cranial nerve deficits appreciated Extremities: Grade 5 x 5 power in the right limbs, grade 0 x 5 power in the left upper extremity and grade 2 x 5 power in the left lower extremity, without change.  Mild bruising of the medial aspect of the right elbow-improving.  Left upper extremity swelling much improved but not completely resolved. Skin: No rashes, lesions or ulcers Psychiatry: Judgement and insight impaired but improved compared to prior. Mood & affect pleasant and appropriate    Data Reviewed:   I have personally reviewed following labs and imaging studies   CBC: Recent Labs  Lab 02/01/20 0405 02/02/20 0504 02/03/20 0327  WBC 5.4 4.9 4.9  NEUTROABS 4.1 3.6  3.5  HGB 9.2* 9.0* 9.1*  HCT 30.8* 30.8* 30.6*  MCV 77.8* 77.8* 78.1*  PLT 164 167 937    Basic Metabolic Panel: Recent Labs  Lab 01/31/20 0410 01/31/20 0410 02/01/20 0405 02/01/20 0405 02/02/20 0504 02/02/20 0504 02/03/20 0327 02/04/20 0313 02/05/20 0423  NA 148*   < > 146*   < > 142   < > 139 139 140  K 3.0*   < > 3.0*   < > 3.2*   < > 3.7 3.9 3.8  CL 117*   < > 116*   < > 112*   < > 110 110 109  CO2 21*   < > 21*   < > 23   < > _1 GLUCOSE 125*   < > 116*   < >  110*   < > 121* 131* 109*  BUN 30*   < > 23   < > 19   < > _0 CREATININE 1.69*   < > 1.46*   < > 1.22   < > 1.11 1.29* 1.15  CALCIUM 9.4   < > 9.4   < > 9.1   < > 8.8* 8.9 9.4  MG 2.3  --  2.2  --   --   --   --  2.1  --   PHOS 2.0*   < > 2.1*   < > 2.7  --  2.7 2.7  --    < > = values in this interval not displayed.    Liver Function Tests: No results for input(s): AST, ALT, ALKPHOS, BILITOT, PROT, ALBUMIN in the last 168 hours.  CBG: No results for input(s): GLUCAP in the last 168 hours.  Microbiology Studies:   No results found for this or any previous visit (from the past 240 hour(s)).   Radiology Studies:  No results found.   Scheduled Meds:   . apixaban  10 mg Oral BID   Followed by  . [START ON 02/08/2020] apixaban  5 mg Oral BID  . aspirin  81 mg Oral Daily  . chlorhexidine gluconate (MEDLINE KIT)  15 mL Mouth Rinse BID  . docusate  100 mg Oral BID  . feeding supplement  237 mL Oral TID BM  . metoprolol tartrate  50 mg Oral BID  . pantoprazole  40 mg Oral BID AC  . polyethylene glycol  17 g Oral Daily  . sodium chloride flush  10-40 mL Intracatheter Q12H    Continuous Infusions:      LOS: 11 days     Vernell Leep, MD, [place], [facility]. [facility]    To contact the attending provider between 7A-7P or the covering provider during after hours 7P-7A, please log into the web site [email or web address] and access using universal [place] password for that web site. If  you do not have the password, please call the hospital operator.  02/06/2020, 5:26 PM

## 2020-02-06 NOTE — Progress Notes (Signed)
Physical Therapy Treatment Patient Details Name: Eric Kennedy MRN: [id number] DOB: [birth year] Today's Date: 02/06/2020    History of Present Illness Patient is an 85 y.o. male presenting to the emergency room after being found down by patient's wife.  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  Patient has left dense hemiparesis.  Patient intubated and now extubated on 11/22. Acute hemorrhagic shock with severe anemia with hemoglobin of 4.7 initially. Patient has a known history of carotid stenosis, diabetes mellitus type 2, chronic anemia, complete heart block, HTN, atrial fibrillation, s/p CABG.     PT Comments    Pt somewhat more awake today interacting at times and answering questions.  Participated in exercises as described below.  He is able to tap toes today on request but needs max assist for other exercises.  He is assisted to EOB with max a/dependant.  Once sitting he is only briefly maintain balance before falling post/to right.  He is able to LAQ in sitting on RLE today with encouragement.  After 5 minutes, increasing assist to remain sitting and seems to try to lay back down.  Pt asked if he is fatigued and wants to lay down and he nods "yes."  Dependant assist back to supine and reposition in bed as seems to fall quickly back asleep.   Follow Up Recommendations  SNF;Other (comment)     Equipment Recommendations       Recommendations for Other Services       Precautions / Restrictions Precautions Precautions: Fall Precaution Comments: L hemi, L inattention Restrictions Weight Bearing Restrictions: No    Mobility  Bed Mobility Overal bed mobility: Needs Assistance Bed Mobility: Rolling Rolling: Max assist   Supine to sit: Total assist;HOB elevated Sit to supine: Total assist;HOB elevated   General bed mobility comments: Max A for rolling side to side with tactile and verbal cues for hand placement to help involve RUE in task and protect  LUE  Transfers                 General transfer comment: NT - Hoyer lift appropriate for staff transfers  Ambulation/Gait                 Stairs             Wheelchair Mobility    Modified Rankin (Stroke Patients Only)       Balance Overall balance assessment: Needs assistance Sitting-balance support: Feet supported Sitting balance-Leahy Scale: Poor Sitting balance - Comments: able to maintain for brief periods today but needs close supervision when attempted.  Typicallly mod/max assist Postural control: Posterior lean;Left lateral lean     Standing balance comment: NT                            Cognition Arousal/Alertness: Lethargic Behavior During Therapy: Flat affect Overall Cognitive Status: Impaired/Different from baseline                                        Exercises General Exercises - Lower Extremity Ankle Circles/Pumps: PROM;Strengthening;Left;10 reps;Supine Long Arc Quad: AROM;Right;10 reps;Seated Heel Slides: AAROM;Strengthening;Left;10 reps;Supine;PROM Hip ABduction/ADduction: AAROM;Strengthening;10 reps;Supine;PROM    General Comments        Pertinent Vitals/Pain Pain Assessment: No/denies pain    Home Living  Prior Function            PT Goals (current goals can now be found in the care plan section) Progress towards PT goals: Not progressing toward goals - comment    Frequency    7X/week      PT Plan Current plan remains appropriate    Co-evaluation              AM-PAC PT "6 Clicks" Mobility   Outcome Measure  Help needed turning from your back to your side while in a flat bed without using bedrails?: Total Help needed moving from lying on your back to sitting on the side of a flat bed without using bedrails?: Total Help needed moving to and from a bed to a chair (including a wheelchair)?: Total Help needed standing up from a chair using your  arms (e.g., wheelchair or bedside chair)?: Total Help needed to walk in hospital room?: Total Help needed climbing 3-5 steps with a railing? : Total 6 Click Score: 6    End of Session   Activity Tolerance: Patient limited by fatigue;Patient limited by lethargy Patient left: in bed;with bed alarm set;with call bell/phone within reach Nurse Communication: Mobility status Hemiplegia - Right/Left: Left Hemiplegia - dominant/non-dominant: Non-dominant Hemiplegia - caused by: Cerebral infarction     Time: 4650-3546 PT Time Calculation (min) (ACUTE ONLY): 17 min  Charges:  $Therapeutic Exercise: 8-22 mins                    Chesley Noon, PTA 02/06/20, 12:50 PM

## 2020-02-07 DIAGNOSIS — J9601 Acute respiratory failure with hypoxia: Secondary | ICD-10-CM | POA: Diagnosis not present

## 2020-02-07 DIAGNOSIS — D62 Acute posthemorrhagic anemia: Secondary | ICD-10-CM | POA: Diagnosis not present

## 2020-02-07 LAB — BASIC METABOLIC PANEL
Anion gap: 8 (ref 5–15)
BUN: 21 mg/dL (ref 8–23)
CO2: 25 mmol/L (ref 22–32)
Calcium: 9.1 mg/dL (ref 8.9–10.3)
Chloride: 109 mmol/L (ref 98–111)
Creatinine, Ser: 0.95 mg/dL (ref 0.61–1.24)
GFR, Estimated: >60 mL/min (ref >60)
Glucose, Bld: 107 mg/dL — ABNORMAL HIGH (ref 70–99)
Potassium: 3.5 mmol/L (ref 3.5–5.1)
Sodium: 142 mmol/L (ref 135–145)

## 2020-02-07 LAB — CBC
HCT: 31.3 % — ABNORMAL LOW (ref 39.0–52.0)
Hemoglobin: 9.3 g/dL — ABNORMAL LOW (ref 13.0–17.0)
MCH: 23.4 pg — ABNORMAL LOW (ref 26.0–34.0)
MCHC: 29.7 g/dL — ABNORMAL LOW (ref 30.0–36.0)
MCV: 78.6 fL — ABNORMAL LOW (ref 80.0–100.0)
Platelets: 359 10*3/uL (ref 150–400)
RBC: 3.98 MIL/uL — ABNORMAL LOW (ref 4.22–5.81)
RDW: 25 % — ABNORMAL HIGH (ref 11.5–15.5)
WBC: 6.5 10*3/uL (ref 4.0–10.5)
nRBC: 0 % (ref 0.0–0.2)

## 2020-02-07 LAB — SARS CORONAVIRUS 2 BY RT PCR (HOSPITAL ORDER, PERFORMED IN ~~LOC~~ HOSPITAL LAB): SARS Coronavirus 2: NEGATIVE

## 2020-02-07 MED ORDER — METOPROLOL TARTRATE 50 MG PO TABS: 50.0000 mg | ORAL_TABLET | Freq: Two times a day (BID) | ORAL | 0 refills | Status: AC

## 2020-02-07 MED ORDER — APIXABAN 5 MG PO TABS: 5.0000 mg | ORAL_TABLET | Freq: Two times a day (BID) | ORAL | 0 refills | Status: AC

## 2020-02-07 MED ORDER — APIXABAN 5 MG PO TABS: 10.0000 mg | ORAL_TABLET | Freq: Two times a day (BID) | ORAL | 0 refills | Status: AC

## 2020-02-07 MED ORDER — PANTOPRAZOLE SODIUM 40 MG PO TBEC: 40.0000 mg | DELAYED_RELEASE_TABLET | Freq: Two times a day (BID) | ORAL | 0 refills | Status: AC

## 2020-02-07 NOTE — Progress Notes (Signed)
Report called to care facility and given to Maudie Mercury, RN.

## 2020-02-07 NOTE — TOC Transition Note (Addendum)
Transition of Care [facility] - CM/SW Discharge Note   Patient Details  Name: Eric Kennedy MRN: [id number] Date of Birth: [birth year]  [facility] CM/SW Contact:  Magnus Ivan, LCSW Phone Number: 02/07/2020, 12:56 PM   Clinical Narrative:   Patient to discharge to [facility] today, Room 605B. Confirmed with Gerald St[facility] at [facility]. CSW updated MD, RN, son [name]. Asked RN to call report and MD to submit DC Summary. Asked for rapid COVID test prior to discharge. Medical Necessity Form, DNR, and Face Sheet placed in Discharge Packet by patient chart. EMS transport arranged for 4:00 pick up time with [facility]. No other needs identified prior to discharge.  2:55- Call from son who reported to make sure patient's dentures and electric razor are sent with him to SNF. CSW informed patient's RN of request.    Final next level of care: Skilled Nursing Facility Barriers to Discharge: Barriers Resolved   Patient Goals and CMS Choice Patient states their goals for this hospitalization and ongoing recovery are:: SNF rehab CMS Medicare.gov Compare Post Acute Care list provided to:: Patient Represenative (must comment) Choice offered to / list presented to : Adult Children  Discharge Placement              Patient chooses bed at: [facility] Patient to be transferred to facility by: [facility] Name of family member notified: [name]- son Patient and family notified of of transfer: 02/07/20  Discharge Plan and Services In-house Referral: Clinical Social Work   Post Acute Care Choice: Home Health                               Social Determinants of Health (SDOH) Interventions     Readmission Risk Interventions No flowsheet data found.

## 2020-02-07 NOTE — Progress Notes (Signed)
[facility] Liaison note:  New referral for [employer] Palliative services to follow at [facility] received from attending physician  Dr. Manuella Ghazi. TOC Meagan Hagwood aware. Plan is for discharge today. Thank you for this referral. Flo Shanks BSN, RN, [place] [phone]

## 2020-02-07 NOTE — Discharge Summary (Signed)
[facility] NAME: Eric Kennedy    MR#:  [id number]  DATE OF BIRTH:  04-11-1934  DATE OF ADMISSION:  01/26/2020   ADMITTING PHYSICIAN: Modena Jansky, MD  DATE OF DISCHARGE: 02/07/2020  PRIMARY CARE PHYSICIAN: [name], MD   ADMISSION DIAGNOSIS:  Hemorrhagic shock ([place]) [R57.8] Microcytic anemia [D50.9] Upper GI bleed [K92.2] Fall [W19.XXXA] Stroke-like symptom [R29.90] Fall, initial encounter [W19.XXXA] Cerebral ischemic stroke due to global hypoperfusion with watershed infarct [facility]) [I63.9] DISCHARGE DIAGNOSIS:  Active Problems:   Upper GI bleed   Acute hypoxemic respiratory failure (HCC)   Acute blood loss anemia   Acute right MCA stroke (HCC)   Cerebral ischemic stroke due to global hypoperfusion with watershed infarct [facility])   Failure to thrive (child)   Goals of care, counseling/discussion   Palliative care by specialist   DNR (do not resuscitate)   Malnutrition of moderate degree  SECONDARY DIAGNOSIS:   Past Medical History:  Diagnosis Date  . Diabetes mellitus without complication ([place])   . Hypertension    HOSPITAL COURSE:  85 year-old male with PMH of CAD, CABG, complete heart block, atrial fibrillation, aortic stenosis, carotid artery stenosis, hypothyroid, DM 2, chronic microcytic anemia, HLD, chronic B12 deficiency admitted after being found down by his wife.  CT head revealed watershed infarcts in the right MCA/PCA distribution.  He has dense left hemiparesis.  He was noted to have severe anemia with hemoglobin of 4.7.  EGD 11/21 showed gastritis and gastric ulcer, placed on PPI twice daily.  He developed acute hypoxic respiratory failure, intubated for airway protection, extubated 11/22.  Dysphagia 1 diet started after MBS on 11/24. Left upper extremity venous Doppler to evaluate for LUE swelling confirmed DVT on 11/25 and Eliquis was initiated.  Overall intake overall is gradually improved.  Therapy/CIR team have  reassessed and do not think that he is appropriate for CIR but more for SNF. Palliative care to follow at SNF. Cardiology consulted on 11/30 for NSVT evaluation and management.   Acute hypoxic respiratory failure: Secondary to acute stroke and inability to protect airway.  Intubated and subsequently extubated 11/22. Resolved and on room air.  Acute blood loss anemia and hemorrhagic shock due to acute upper GI bleed:  S/p EGD which showed gastric ulcer without bleeding and gastritis.  Hemoglobin stable in the 9 g range after transfusions.  Continue twice daily PPI.  No overt bleeding reported. Monitor closely while on Eliquis. Hemoglobin stable in the 9 g range for several days. 9.3 gm at D/C.  Acute right MCA stroke complicated by left hemiplegia, dysarthria and dysphagia Due to hypoperfusion infarct secondary to anemia/hypotension. Neurology indicates that they are concerned that he may not regain much movement in his left arm and leg. Patient was on aspirin 81 mg daily and Plavix 75 mg daily. However since the patient developed left upper extremity DVT, Neuro recommendeds anticoagulation with a DOAC along with aspirin 81 mg daily. Plavix discontinued. Therapy/CIR team have reassessed and do not think that he is appropriate for CIR but more for SNF because he is only able to perform/tolerate midlevel activities.  Acute left upper extremity DVT Venous Doppler confirms partially occlusive left upper extremity DVT involving brachial, axillary and subclavian veins along with superficial thrombophlebitis involving basilic vein. As per neuro hospitalist, initiated Eliquis 11/25. Patient will remain on aspirin 81 mg daily for recent acute stroke but stopped Plavix. Obviously if patient develops bleeding complications, then will have to stop  anticoagulation.  Tolerating for now.  Dysphagia: Speech therapy recommends dysphagia diet and thin liquids.  Over the course of the last 4 to 5 days, oral intake has  gradually increased.  Patient however is total assist and needs to be fed.  He also has quite a few food preferences and his PT and will spit out foods that he does not like.  Patient son has placed a list of his food preferences on top of his bed.  Registered dietitian has consulted on 11/30 and indicate that today he ate 80% of his breakfast and 70% of lunch and have made dietary recommendations.  At this time, do not think that feeding tube needs to be entertained.  Hopefully his oral intake continues to improve.  Hypernatremia Resolved after hypotonic IV fluids.  Remains at risk for recurrent dehydration and hypernatremia from poor oral intake.  Hypokalemia: Improved.   Hypophosphatemia Replaced.  Acute kidney injury complicating stage IIIa CKD  Creatinine 1.4 on 01/19/2019.  Presented with creatinine of 2.01 which peaked to 2.64.  Resolved. Creat 0.95   Moderate aortic stenosis Noted on 2D echo.  Outpatient follow-up if desired by patient/family.  NSTEMI history of CAD, CABG, aortic valve replacement. Secondary to acute stroke.  Cardiology -no anginal symptoms and hence no further cardiac work-up needed.  Continue aspirin.  Paroxysmal atrial fibrillation Remains in sinus rhythm. Was not on anticoagulation candidate due to recent GI bleed. Now started anticoagulation for DVT, closely monitor for bleeding. metoprolol for better rate control and to reduce SVT. Continue Eliquis long-term if he tolerates.  Also continue single antiplatelet agent and avoid triple therapy to avoid bleeding complications.  Adult failure to thrive: Palliative care to follow at SNF and consider transition to Hospice when appropriate.  Moderate Malnutrition - due to chronic illness Signs/Symptoms: mild fat depletion, moderate fat depletion, mild muscle depletion, moderate muscle depletion. Nutritionist seen while in the hospital. Hopefully he will maintain PO nutrition but certainly at high risk for worsening  nutritional status.  DISCHARGE CONDITIONS:  fair CONSULTS OBTAINED:  Treatment Team:  Corey Skains, MD DRUG ALLERGIES:  No Known Allergies DISCHARGE MEDICATIONS:   Allergies as of 02/07/2020   No Known Allergies     Medication List    STOP taking these medications   omeprazole 40 MG capsule Commonly known as: PRILOSEC     TAKE these medications   amLODipine 10 MG tablet Commonly known as: NORVASC Take 10 mg by mouth daily.   apixaban 5 MG Tabs tablet Commonly known as: ELIQUIS Take 2 tablets (10 mg total) by mouth 2 (two) times daily for 1 day. 10 mg po bid for 2 days f/by 5 mg po bid   apixaban 5 MG Tabs tablet Commonly known as: ELIQUIS Take 1 tablet (5 mg total) by mouth 2 (two) times daily. 10 mg po bid for 2 days f/by 5 mg po bid Start taking on: February 09, 2020   aspirin EC 81 MG tablet Take 81 mg by mouth daily.   levothyroxine 50 MCG tablet Commonly known as: SYNTHROID Take 50 mcg by mouth daily.   metoprolol tartrate 50 MG tablet Commonly known as: LOPRESSOR Take 1 tablet (50 mg total) by mouth 2 (two) times daily.   pantoprazole 40 MG tablet Commonly known as: PROTONIX Take 1 tablet (40 mg total) by mouth 2 (two) times daily before a meal.   PX Complete Senior Multivits Tabs Take 1 tablet by mouth daily.   rosuvastatin 40 MG tablet Commonly  known as: CRESTOR Take 40 mg by mouth daily.   telmisartan 80 MG tablet Commonly known as: MICARDIS Take 80 mg by mouth daily.      DISCHARGE INSTRUCTIONS:   DIET:  Cardiac diet DISCHARGE CONDITION:  Fair ACTIVITY:  Activity as tolerated OXYGEN:  Home Oxygen: No.  Oxygen Delivery: room air DISCHARGE LOCATION:  nursing home with Palliative care to follow   If you experience worsening of your admission symptoms, develop shortness of breath, life threatening emergency, suicidal or homicidal thoughts you must seek medical attention immediately by calling 911 or calling your MD immediately  if  symptoms less severe.  You Must read complete instructions/literature along with all the possible adverse reactions/side effects for all the Medicines you take and that have been prescribed to you. Take any new Medicines after you have completely understood and accpet all the possible adverse reactions/side effects.   Please note  You were cared for by a hospitalist during your hospital stay. If you have any questions about your discharge medications or the care you received while you were in the hospital after you are discharged, you can call the unit and asked to speak with the hospitalist on call if the hospitalist that took care of you is not available. Once you are discharged, your primary care physician will handle any further medical issues. Please note that NO REFILLS for any discharge medications will be authorized once you are discharged, as it is imperative that you return to your primary care physician (or establish a relationship with a primary care physician if you do not have one) for your aftercare needs so that they can reassess your need for medications and monitor your lab values.    On the day of Discharge:  VITAL SIGNS:  Blood pressure 140/61, pulse 68, temperature 98.3 F (36.8 C), resp. rate 16, height 5\' 8"  (1.727 m), weight 77.4 kg, SpO2 97 %. PHYSICAL EXAMINATION:  GENERAL:  85 y.o.-year-old pleasant elderly male, small built and thinly nourished, lying comfortably supine in bed without distress. Respiratory system: Clear to auscultation.  No increased work of breathing. Cardiovascular system: S1 & S2 heard, RRR. No JVD, murmurs, rubs, gallops or clicks. No pedal edema.  Telemetry personally reviewed, sinus rhythm, on demand paced rhythm and 5-6 beats of nonsustained wide-complex tachycardia-now nonsustained SVT per cardiology. Gastrointestinal system: Abdomen is nondistended, soft and nontender. No organomegaly or masses felt. Normal bowel sounds heard. Central nervous  system: Remains alert, oriented x2, follows simple instructions.  Left facial droop and mild dysarthria seem to be improving slowly.  No other cranial nerve deficits appreciated Extremities: Grade 5 x 5 power in the right limbs, grade 0 x 5 power in the left upper extremity and grade 2 x 5 power in the left lower extremity, without change.  Mild bruising of the medial aspect of the right elbow-improving.  Left upper extremity swelling much improved but not completely resolved. Skin: No rashes, lesions or ulcers Psychiatry: Judgement and insight impaired but improved compared to prior. Mood & affect pleasant and appropriate DATA REVIEW:   CBC Recent Labs  Lab 02/07/20 0448  WBC 6.5  HGB 9.3*  HCT 31.3*  PLT 359    Chemistries  Recent Labs  Lab 02/04/20 0313 02/05/20 0423 02/07/20 0448  NA 139   < > 142  K 3.9   < > 3.5  CL 110   < > 109  CO2 22   < > 25  GLUCOSE 131*   < >  107*  BUN 19   < > 21  CREATININE 1.29*   < > 0.95  CALCIUM 8.9   < > 9.1  MG 2.1  --   --    < > = values in this interval not displayed.     Outpatient follow-up  Follow-up Information    [name], MD. Schedule an appointment as soon as possible for a visit in 1 week(s).   Specialty: Internal Medicine Contact information: [place] [address] [phone]        Vladimir Crofts, MD. Schedule an appointment as soon as possible for a visit in 2 week(s).   Specialty: Neurology Contact information: [place] [facility] [place] [address] [phone]                Management plans discussed with the patient, family (updated son Nike over Phone) and they are in agreement.  CODE STATUS: DNR   TOTAL TIME TAKING CARE OF THIS PATIENT: 45 minutes.    Max Sane M.D on 02/07/2020 at 8:13 AM  [facility]   CC: Primary care physician; [name], MD   Note: This dictation was  prepared with Dragon dictation along with smaller phrase technology. Any transcriptional errors that result from this process are unintentional.

## 2020-02-07 NOTE — Progress Notes (Signed)
[facility] Encounter Note  Patient: Eric Kennedy / Admit Date: 01/26/2020 / Date of Encounter: 02/07/2020, 7:12 AM   Subjective: 12/1 overall patient has had no no change in clinical condition overnight.  The patient still has dysphagia and has had some hemiparesis.  The patient appears to be breathing well with no evidence of cough or congestion chest pain shortness of breath congestive heart failure or angina at this time.  Heart rate seems to be relatively controlled and has had no significant apparent telemetry changes since addition of metoprolol.  Have only had 1 dose at this time so we will see how today hopefully improves heart rate control and reduces the possibility of further episodes of rapid heart rate.  Patient does have pacemaker backup for any other concerns.  Review of Systems: Positive for: Weakness Negative for: Vision change, hearing change, syncope, dizziness, nausea, vomiting,diarrhea, bloody stool, stomach pain, cough, congestion, diaphoresis, urinary frequency, urinary pain,skin lesions, skin rashes Others previously listed  Objective: Telemetry: Normal sinus rhythm with ventricular pacing Physical Exam: Blood pressure 140/61, pulse 68, temperature 98.3 F (36.8 C), resp. rate 16, height _0  (1.727 m), weight 77.4 kg, SpO2 97 %. Body mass index is 25.95 kg/m. General: Well developed, well nourished, in no acute distress. Head: Normocephalic, atraumatic, sclera non-icteric, no xanthomas, nares are without discharge. Neck: No apparent masses Lungs: Normal respirations with no wheezes, few rhonchi, no rales , no crackles   Heart: Regular rate and rhythm, normal S1 S2, no murmur, no rub, no gallop, PMI is normal size and placement, carotid upstroke normal without bruit, jugular venous pressure normal Abdomen: Soft, non-tender, non-distended with normoactive bowel sounds. No hepatosplenomegaly. Abdominal aorta is normal size without  bruit Extremities: Trace edema, no clubbing, no cyanosis, no ulcers,  Peripheral: 2+ radial, 2+ femoral, 2+ dorsal pedal pulses Neuro: Alert and not oriented. Moves all extremities spontaneously.  With neurologic abnormalities Psych:  Responds to questions appropriately with a normal affect.   Intake/Output Summary (Last 24 hours) at 02/07/2020 8466 Last data filed at 02/06/2020 1421 Gross per 24 hour  Intake 390 ml  Output 250 ml  Net 140 ml    Inpatient Medications:  . apixaban  10 mg Oral BID   Followed by  . [START ON 02/08/2020] apixaban  5 mg Oral BID  . aspirin  81 mg Oral Daily  . chlorhexidine gluconate (MEDLINE KIT)  15 mL Mouth Rinse BID  . docusate  100 mg Oral BID  . feeding supplement  237 mL Oral TID BM  . metoprolol tartrate  50 mg Oral BID  . pantoprazole  40 mg Oral BID AC  . polyethylene glycol  17 g Oral Daily  . sodium chloride flush  10-40 mL Intracatheter Q12H   Infusions:   Labs: Recent Labs    02/05/20 0423 02/07/20 0448  NA 140 142  K 3.8 3.5  CL 109 109  CO2 23 25  GLUCOSE 109* 107*  BUN 23 21  CREATININE 1.15 0.95  CALCIUM 9.4 9.1   No results for input(s): AST, ALT, ALKPHOS, BILITOT, PROT, ALBUMIN in the last 72 hours. Recent Labs    02/07/20 0448  WBC 6.5  HGB 9.3*  HCT 31.3*  MCV 78.6*  PLT 359   No results for input(s): CKTOTAL, CKMB, TROPONINI in the last 72 hours. Invalid input(s): POCBNP No results for input(s): HGBA1C in the last 72 hours.   Weights: Filed Weights   02/01/20 0500 02/04/20 0340 02/07/20 0500  Weight: 76.6 kg 77.3 kg 77.4 kg     Radiology/Studies:  DG Chest 1 View  Result Date: 01/26/2020 CLINICAL DATA:  85 year old male found down. EXAM: CHEST  1 VIEW COMPARISON:  None. FINDINGS: Portable AP supine view at 1644 hours. Prior sternotomy, cardiac valve replacement. Left chest dual lead cardiac pacemaker. Cardiac size and mediastinal contours are within normal limits. Mildly low lung volumes. Asymmetric  left lateral and costophrenic angle pleural density. Mild associated patchy peripheral opacity in the left lung. Mild streaky opacity in the left mid lung more resembling atelectasis or scarring. Allowing for portable technique the right lung is clear. No pneumothorax is evident. No rib fracture is identified. Grossly intact other visible osseous structures. Negative visible bowel gas pattern. IMPRESSION: 1. Suspicion of a small left pleural effusion, and patchy nonspecific opacity in the left mid lung. No adjacent rib fracture is identified radiographically. 2. No other acute cardiopulmonary abnormality. Electronically Signed   By: Genevie Ann M.D.   On: 01/26/2020 17:18   DG Pelvis 1-2 Views  Result Date: 01/26/2020 CLINICAL DATA:  85 year old male found down. EXAM: PELVIS - 1-2 VIEW COMPARISON:  None. FINDINGS: Portable AP supine view at 1643 hours. Femoral heads are normally located. Hip joint spaces are normal for age. The pelvis appears intact. Grossly intact proximal femurs. Calcified femoral artery atherosclerosis. Oval circumscribed probable calcified phlebolith in the right central pelvis. Negative visible bowel gas pattern. IMPRESSION: No acute fracture or dislocation identified about the pelvis. Electronically Signed   By: Genevie Ann M.D.   On: 01/26/2020 17:19   DG Abdomen 1 View  Result Date: 01/26/2020 CLINICAL DATA:  Evaluate OG tube EXAM: ABDOMEN - 1 VIEW COMPARISON:  None. FINDINGS: The OG tube terminates in left upper quadrant, within the stomach. Is difficult to clearly see the side port but I believe the side port is likely in the stomach. IMPRESSION: The OG tube appears to be in good position. Electronically Signed   By: Dorise Bullion III M.D   On: 01/26/2020 19:34   CT HEAD WO CONTRAST  Result Date: 01/27/2020 CLINICAL DATA:  85 year old male found down. Left side weakness with evidence of acute to subacute infarcts in the right MCA territory on presentation head CT 1540 hours  01/26/2020. EXAM: CT HEAD WITHOUT CONTRAST TECHNIQUE: Contiguous axial images were obtained from the base of the skull through the vertex without intravenous contrast. COMPARISON:  Head CTs at 15:40 and 2040 hours yesterday. FINDINGS: Brain: Multifocal evolving cytotoxic edema in the posterior right hemisphere and along the right MCA/PCA watershed area. Areas involved include the superior perirolandic cortex (series 2 images 25 through 27), right parietal lobe, lateral right occipital lobe. No associated acute hemorrhage or intracranial mass effect. Stable gray-white matter differentiation elsewhere since early yesterday. No ventriculomegaly. Vascular: Calcified atherosclerosis at the skull base. No suspicious intracranial vascular hyperdensity. Skull: No acute osseous abnormality identified. Sinuses/Orbits: Stable. Chronic right sphenoid sinusitis. Trace mastoid fluid. Other: No acute orbit or scalp soft tissue finding. IMPRESSION: 1. Evolving cortical infarcts in the posterior Right MCA and also the Right MCA/PCA watershed area. No associated hemorrhage or mass effect. 2. Stable CT appearance of the brain elsewhere. Electronically Signed   By: Genevie Ann M.D.   On: 01/27/2020 12:08   CT HEAD WO CONTRAST  Result Date: 01/26/2020 CLINICAL DATA:  Found down EXAM: CT HEAD WITHOUT CONTRAST TECHNIQUE: Contiguous axial images were obtained from the base of the skull through the vertex without intravenous contrast. COMPARISON:  01/26/2020  FINDINGS: Brain: Again noted over the small areas of potential acute to subacute infarcts within the right frontal, right parietal lobes. No new suspicious areas for acute infarction. There is atrophy and chronic small vessel disease changes. No hydrocephalus. No hemorrhage. Vascular: No hyperdense vessel or unexpected calcification. Skull: No acute calvarial abnormality. Sinuses/Orbits: Mucosal thickening in the right sphenoid sinus. No acute findings. Other: None IMPRESSION: Stable  small low-density areas within the right frontal and parietal lobes which could reflect small acute or subacute infarcts, unchanged since prior study. No hemorrhage. Electronically Signed   By: Rolm Baptise M.D.   On: 01/26/2020 20:49   US Carotid Bilateral  Result Date: 01/27/2020 CLINICAL DATA:  Stroke symptoms, hyperlipidemia and diabetes EXAM: BILATERAL CAROTID DUPLEX ULTRASOUND TECHNIQUE: Pearline Cables scale imaging, color Doppler and duplex ultrasound were performed of bilateral carotid and vertebral arteries in the neck. COMPARISON:  None. FINDINGS: Criteria: Quantification of carotid stenosis is based on velocity parameters that correlate the residual internal carotid diameter with NASCET-based stenosis levels, using the diameter of the distal internal carotid lumen as the denominator for stenosis measurement. The following velocity measurements were obtained: RIGHT ICA: 207/24 cm/sec CCA: 811/91 cm/sec SYSTOLIC ICA/CCA RATIO:  2.0 ECA: 179 cm/sec LEFT ICA: 255/25 cm/sec CCA: 47/82 cm/sec SYSTOLIC ICA/CCA RATIO:  2.6 ECA: 280 cm/sec RIGHT CAROTID ARTERY: Intimal thickening and mixed echogenicity heterogeneous carotid bifurcation atherosclerosis. Mid ICA velocity elevation measures 207/24 centimeters/second with mild turbulent flow and spectral broadening. Right ICA stenosis estimated at 50-69% by ultrasound criteria. RIGHT VERTEBRAL ARTERY:  Normal antegrade flow LEFT CAROTID ARTERY: Extensive calcific atherosclerosis of the left carotid bifurcation. Left proximal ICA velocity elevation measures up to 255/25 centimeters/second with turbulent flow and spectral broadening. Left ICA stenosis meets criteria for a greater than 70% stenosis. LEFT VERTEBRAL ARTERY:  Not visualized, suspect occluded IMPRESSION: Right ICA stenosis estimated at 50-69% Left ICA stenosis estimated greater than 70% Patent antegrade flow in the right vertebral artery Nonvisualized left vertebral artery, suspect occluded Electronically Signed    By: Jerilynn Mages.  Shick M.D.   On: 01/27/2020 09:18   US Venous Img Upper Uni Left (DVT)  Result Date: 02/01/2020 CLINICAL DATA:  Swelling x2 days EXAM: LEFT UPPER EXTREMITY VENOUS DOPPLER ULTRASOUND TECHNIQUE: Gray-scale sonography with graded compression, as well as color Doppler and duplex ultrasound were performed to evaluate the upper extremity deep venous system from the level of the subclavian vein and including the jugular, axillary, basilic, radial, ulnar and upper cephalic vein. Spectral Doppler was utilized to evaluate flow at rest and with distal augmentation maneuvers. COMPARISON:  None. FINDINGS: Contralateral Subclavian Vein: Respiratory phasicity is normal and symmetric with the symptomatic side. No evidence of thrombus. Normal compressibility. Internal Jugular Vein: No evidence of thrombus. Normal compressibility, respiratory phasicity and response to augmentation. Subclavian Vein: Hypoechoic partially occlusive thrombus in the peripheral aspect, with continued color Doppler flow signal through this segment. Axillary Vein: Eccentric thrombus resulting in incomplete compressibility, with some continued color Doppler flow signal. Cephalic Vein: No evidence of thrombus. Normal compressibility, respiratory phasicity and response to augmentation. Portions obscured due to overlying dressing. Basilic Vein: Hypoechoic thrombus resulting in incomplete segmental compressibility. Persistent antegrade color flow signal elsewhere. Brachial Veins: Partially occlusive thrombus Radial Veins: No evidence of thrombus. Normal compressibility, respiratory phasicity and response to augmentation. Ulnar Veins: No evidence of thrombus. Normal compressibility, respiratory phasicity and response to augmentation. Venous Reflux:  None visualized. Other Findings:  None visualized. IMPRESSION: 1. POSITIVE for partially-occlusive left upper extremity DVT involving brachial,  axillary, and subclavian veins. 2. Superficial  thrombophlebitis involving basilic vein. Electronically Signed   By: Lucrezia Europe M.D.   On: 02/01/2020 10:22   DG Chest Port 1 View  Result Date: 01/27/2020 CLINICAL DATA:  Respiratory failure. EXAM: PORTABLE CHEST 1 VIEW COMPARISON:  01/26/2020 and prior radiographs FINDINGS: An endotracheal tube with tip 3 cm above the carina and OG tube entering the stomach again noted. Pulmonary vascular congestion is again noted. Bilateral LOWER lung opacities have slightly increased from the prior study. No pneumothorax or large pleural effusion noted. Cardiac surgical changes and LEFT-sided pacemaker again noted. IMPRESSION: Slightly increased bilateral LOWER lung opacities/atelectasis without other significant change. Electronically Signed   By: Margarette Canada M.D.   On: 01/27/2020 07:31   DG Chest Portable 1 View  Result Date: 01/26/2020 CLINICAL DATA:  Evaluate OG tube an ETT placement. EXAM: PORTABLE CHEST 1 VIEW COMPARISON:  January 26, 2020 FINDINGS: An ET tube is been placed in the interval. The distal tip appears to be 3 cm above the carina in good position. The distal tip of the NG tube is in the region of the stomach. The side port is not well seen on this study. The cardiomediastinal silhouette is stable. Probable pulmonary venous congestion. Opacity in the periphery of the left mid lower lung is stable. No other acute abnormalities. No pneumothorax. Small left effusion. IMPRESSION: 1. The distal tip of the NG tube is in the stomach. The side port is difficult to see on this study. The ETT is in good position. 2. Persistent opacity in the periphery of the left mid lower lung with a small left effusion. Electronically Signed   By: Dorise Bullion III M.D   On: 01/26/2020 19:33   DG Swallowing Func-Speech Pathology  Result Date: 01/31/2020 Objective Swallowing Evaluation: Type of Study: MBS-Modified Barium Swallow Study  Patient Details Name: Cael Worth MRN: [id number] Date of Birth: 1934/10/03 Today's  Date: 01/31/2020 Time: SLP Start Time (ACUTE ONLY): 0825 -SLP Stop Time (ACUTE ONLY): 0850 SLP Time Calculation (min) (ACUTE ONLY): 25 min Past Medical History: Past Medical History: Diagnosis Date . Diabetes mellitus without complication ([place])  . Hypertension  Past Surgical History: Past Surgical History: Procedure Laterality Date . CARDIAC SURGERY    triple bypass . ESOPHAGOGASTRODUODENOSCOPY N/A 01/28/2020  Procedure: ESOPHAGOGASTRODUODENOSCOPY (EGD);  Surgeon: [name], [name], MD;  Location: [facility] ENDOSCOPY;  Service: Gastroenterology;  Laterality: N/A; HPI: Patient is a 85 y.o male with history of coronary artery disease, coronary artery bypass grafting, complete heart block, atrial fibrillation aortic stenosis carotid artery stenosis, history of hypothyroidism and diabetes type 2, adhesive capsulitis of the left shoulder, chronic microcytic anemia history of choroidal hemangioma, dyslipidemia, chronic B12 deficiency admitted to ED on 11/19 d/t mechanical fall in garage. Per wife, patient with noted physical and cognitive decline for past 6 months. CT showed infarcts in R frontal, parietal, and BG areas with additional upper GI bleed dx. D/t acute respiratory failure, patient intubated on 11/19 and recently extubated 11/22. SLP to complete BSE to evaluate patient's oropharyngeal swallowing function and ability to resume PO diet.  Subjective: pt pleasant, not very interaction Assessment / Plan / Recommendation CHL IP CLINICAL IMPRESSIONS 01/31/2020 Clinical Impression Severe oral phase dysphagia and milder pharyngeal phase dysphagia when consuming nectar thick liquids, thin liquids via straw and puree. Pt's oral phase is c/b weak lingual manipulation and reduced posterior propulsion of bolus. This results in delayed oral transit, decreased bolus cohesion, premature spillage of boluses and piecemeal swallowing.  Trace oral residue remains post swallow. Pt's pharyngeal phase is c/b swallow initiation at the  vallecula with nectar thick liquids and to the level of the pyriform sinuses with thin liquids. Penetration and aspiration were not observed during the study. More advanced solids were not presented d/t severity of oral phase deficits. Recommend initiating a dysphagia 1 diet with thin liquids (straws ok) and medicine crushed in puree. ST to continue following pt.    SLP Visit Diagnosis Dysphagia, oropharyngeal phase (R13.12) Attention and concentration deficit following -- Frontal lobe and executive function deficit following -- Impact on safety and function Mild aspiration risk   CHL IP TREATMENT RECOMMENDATION 01/31/2020 Treatment Recommendations Therapy as outlined in treatment plan below   Prognosis 01/31/2020 Prognosis for Safe Diet Advancement Fair Barriers to Reach Goals Cognitive deficits;Time post onset;Severity of deficits Barriers/Prognosis Comment -- CHL IP DIET RECOMMENDATION 01/31/2020 SLP Diet Recommendations Dysphagia 1 (Puree) solids;Thin liquid Liquid Administration via Straw;Cup Medication Administration Crushed with puree Compensations Minimize environmental distractions;Slow rate;Small sips/bites Postural Changes Seated upright at 90 degrees   CHL IP OTHER RECOMMENDATIONS 01/31/2020 Recommended Consults -- Oral Care Recommendations Oral care BID Other Recommendations --   CHL IP FOLLOW UP RECOMMENDATIONS 01/31/2020 Follow up Recommendations (No Data)   CHL IP FREQUENCY AND DURATION 01/31/2020 Speech Therapy Frequency (ACUTE ONLY) min 3x week Treatment Duration 2 weeks      CHL IP ORAL PHASE 01/31/2020 Oral Phase Impaired Oral - Pudding Teaspoon -- Oral - Pudding Cup -- Oral - Honey Teaspoon -- Oral - Honey Cup -- Oral - Nectar Teaspoon Weak lingual manipulation;Incomplete tongue to palate contact;Reduced posterior propulsion;Holding of bolus;Piecemeal swallowing;Delayed oral transit;Decreased bolus cohesion;Premature spillage;Lingual/palatal residue Oral - Nectar Cup Weak lingual  manipulation;Incomplete tongue to palate contact;Reduced posterior propulsion;Holding of bolus;Lingual/palatal residue;Piecemeal swallowing;Delayed oral transit;Decreased bolus cohesion;Premature spillage Oral - Nectar Straw -- Oral - Thin Teaspoon Weak lingual manipulation;Incomplete tongue to palate contact;Reduced posterior propulsion;Holding of bolus;Lingual/palatal residue;Piecemeal swallowing;Delayed oral transit;Decreased bolus cohesion;Premature spillage Oral - Thin Cup -- Oral - Thin Straw Weak lingual manipulation;Incomplete tongue to palate contact;Reduced posterior propulsion;Holding of bolus;Piecemeal swallowing;Delayed oral transit;Decreased bolus cohesion;Premature spillage;Lingual/palatal residue Oral - Puree Weak lingual manipulation;Incomplete tongue to palate contact;Reduced posterior propulsion;Holding of bolus;Lingual/palatal residue;Piecemeal swallowing;Delayed oral transit;Decreased bolus cohesion;Premature spillage Oral - Mech Soft -- Oral - Regular -- Oral - Multi-Consistency -- Oral - Pill -- Oral Phase - Comment severe impairments  CHL IP PHARYNGEAL PHASE 01/31/2020 Pharyngeal Phase Impaired Pharyngeal- Pudding Teaspoon -- Pharyngeal -- Pharyngeal- Pudding Cup -- Pharyngeal -- Pharyngeal- Honey Teaspoon -- Pharyngeal -- Pharyngeal- Honey Cup -- Pharyngeal -- Pharyngeal- Nectar Teaspoon Delayed swallow initiation-vallecula;Reduced tongue base retraction Pharyngeal -- Pharyngeal- Nectar Cup Delayed swallow initiation-vallecula;Reduced tongue base retraction Pharyngeal -- Pharyngeal- Nectar Straw -- Pharyngeal -- Pharyngeal- Thin Teaspoon Delayed swallow initiation-pyriform sinuses;Reduced tongue base retraction Pharyngeal -- Pharyngeal- Thin Cup -- Pharyngeal -- Pharyngeal- Thin Straw Delayed swallow initiation-pyriform sinuses;Reduced tongue base retraction Pharyngeal -- Pharyngeal- Puree Delayed swallow initiation-vallecula;Reduced tongue base retraction Pharyngeal -- Pharyngeal-  Mechanical Soft -- Pharyngeal -- Pharyngeal- Regular -- Pharyngeal -- Pharyngeal- Multi-consistency -- Pharyngeal -- Pharyngeal- Pill -- Pharyngeal -- Pharyngeal Comment pharyngeal phase is slow and is without penetration or aspiration  CHL IP CERVICAL ESOPHAGEAL PHASE 01/31/2020 Cervical Esophageal Phase WFL Pudding Teaspoon -- Pudding Cup -- Honey Teaspoon -- Honey Cup -- Nectar Teaspoon -- Nectar Cup -- Nectar Straw -- Thin Teaspoon -- Thin Cup -- Thin Straw -- Puree -- Mechanical Soft -- Regular -- Multi-consistency -- Pill -- Cervical Esophageal Comment -- Happi  Mart Piggs, M.S., CCC-SLP, [place] Office [phone] [name] 01/31/2020, 10:14 AM              ECHOCARDIOGRAM COMPLETE  Result Date: 01/28/2020    ECHOCARDIOGRAM REPORT   Patient Name:   Dandrea Arya Date of Exam: 01/27/2020 Medical Rec #:  [id number]   Height:       68.0 in Accession #:    [id number]  Weight:       160.7 lb Date of Birth:  [birth year]   BSA:          1.862 m Patient Age:    43 years    BP:           116/49 mmHg Patient Gender: M           HR:           60 bpm. Exam Location:  [facility] Procedure: 2D Echo, Cardiac Doppler and Color Doppler Indications:     Stroke 434.91  History:         Patient has no prior history of Echocardiogram examinations.                  Prior CABG; Risk Factors:Hypertension and Diabetes.  Sonographer:     Sherrie Sport RDCS (AE) Referring Phys:  [id number] Lorenza Chick Diagnosing Phys: Serafina Royals MD  Sonographer Comments: Echo performed with patient supine and on artificial respirator and suboptimal apical window. IMPRESSIONS  1. Left ventricular ejection fraction, by estimation, is 50 to 55%. The left ventricle has low normal function. The left ventricle has no regional wall motion abnormalities. Left ventricular diastolic parameters were normal.  2. Right ventricular systolic function is normal. The right ventricular size is normal.  3. Left atrial  size was moderately dilated.  4. 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  5. The aortic valve is calcified. Aortic valve regurgitation is trivial. Moderate aortic valve stenosis. FINDINGS  Left Ventricle: Left ventricular ejection fraction, by estimation, is 50 to 55%. The left ventricle has low normal function. The left ventricle has no regional wall motion abnormalities. The left ventricular internal cavity size was normal in size. There is no left ventricular hypertrophy. Left ventricular diastolic parameters were normal. Right Ventricle: The right ventricular size is normal. No increase in right ventricular wall thickness. Right ventricular systolic function is normal. Left Atrium: Left atrial size was moderately dilated. Right Atrium: Right atrial size was normal in size. Pericardium: There is no evidence of pericardial effusion. 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 Mild mitral valve stenosis. MV peak gradient, 19.2 mmHg. The mean mitral valve gradient is 8.0 mmHg. Tricuspid Valve: The tricuspid valve is normal in structure. Tricuspid valve regurgitation is mild. Aortic Valve: The aortic valve is calcified. Aortic valve regurgitation is trivial. Moderate aortic stenosis is present. Aortic valve mean gradient measures 26.0 mmHg. Aortic valve peak gradient measures 44.3 mmHg. Aortic valve area, by VTI measures 0.60  cm. Pulmonic Valve: The pulmonic valve was normal in structure. Pulmonic valve regurgitation is not visualized. Aorta: The aortic root and ascending aorta are structurally normal, with no evidence of dilitation. IAS/Shunts: No atrial level shunt detected by color flow Doppler.  LEFT VENTRICLE PLAX 2D LVIDd:         4.30 cm LVIDs:         2.53 cm LV PW:         0.98 cm LV IVS:  0.62 cm LVOT diam:     2.00 cm LV SV:         45 LV SV Index:   24 LVOT Area:     3.14 cm  RIGHT VENTRICLE RV Basal diam:  5.72 cm RV  S prime:     9.68 cm/s TAPSE (M-mode): 3.6 cm LEFT ATRIUM              Index       RIGHT ATRIUM           Index LA diam:        5.10 cm  2.74 cm/m  RA Area:     24.20 cm LA Vol (A2C):   147.0 ml 78.93 ml/m RA Volume:   82.10 ml  44.08 ml/m LA Vol (A4C):   100.0 ml 53.70 ml/m LA Biplane Vol: 121.0 ml 64.97 ml/m  AORTIC VALVE AV Area (Vmax):    0.52 cm AV Area (Vmean):   0.54 cm AV Area (VTI):     0.60 cm AV Vmax:           332.75 cm/s AV Vmean:          234.750 cm/s AV VTI:            0.739 m AV Peak Grad:      44.3 mmHg AV Mean Grad:      26.0 mmHg LVOT Vmax:         55.40 cm/s LVOT Vmean:        40.500 cm/s LVOT VTI:          0.142 m LVOT/AV VTI ratio: 0.19  AORTA Ao Root diam: 2.00 cm MITRAL VALVE                TRICUSPID VALVE MV Area (PHT): 2.37 cm     TR Peak grad:   49.6 mmHg MV Peak grad:  19.2 mmHg    TR Vmax:        352.00 cm/s MV Mean grad:  8.0 mmHg MV Vmax:       2.19 m/s     SHUNTS MV Vmean:      131.0 cm/s   Systemic VTI:  0.14 m MV Decel Time: 320 msec     Systemic Diam: 2.00 cm MV E velocity: 169.00 cm/s MV A velocity: 164.00 cm/s MV E/A ratio:  1.03 Serafina Royals MD Electronically signed by Serafina Royals MD Signature Date/Time: 01/28/2020/6:32:58 AM    Final    CT HEAD CODE STROKE WO CONTRAST  Result Date: 01/26/2020 CLINICAL DATA:  Code stroke. Left-sided collect and upper extremity weakness. EXAM: CT HEAD WITHOUT CONTRAST TECHNIQUE: Contiguous axial images were obtained from the base of the skull through the vertex without intravenous contrast. COMPARISON:  01/03/2018 head and neck CTA FINDINGS: The study is mildly motion degraded despite repeat imaging. Brain: There are small cortical infarcts in the posterior right frontal lobe (precentral gyrus extending into the centrum semiovale) and right parietal lobe which are new from the CT and of indeterminate age though may be acute or subacute. There is also a new age indeterminate lacunar infarct in the right caudate head. No acute  intracranial hemorrhage, mass, midline shift, or extra-axial fluid collection is identified. There is mild cerebral atrophy. Hypodensities in the cerebral white matter bilaterally are nonspecific but compatible with mild chronic small vessel ischemic disease. Vascular: Calcified atherosclerosis at the skull base. No hyperdense vessel. Skull: No fracture or suspicious osseous lesion. Sinuses/Orbits: Chronic right sphenoid sinusitis. Small  chronic right mastoid effusion. Bilateral cataract extraction. Other: None. ASPECTS [facility] Stroke Program Early CT Score) - Ganglionic level infarction (caudate, lentiform nuclei, internal capsule, insula, M1-M3 cortex): 6 - Supraganglionic infarction (M4-M6 cortex): 2 Total score (0-10 with 10 being normal): 8 IMPRESSION: 1. Small, potentially acute or subacute infarcts in the right frontal lobe, right parietal lobe, and right basal ganglia. No hemorrhage. 2. ASPECTS is 8. 3. Mild chronic small vessel ischemic disease. These results were called by telephone at the time of interpretation on 01/26/2020 at 3:56 pm to Dr. Vladimir Crofts, who verbally acknowledged these results. Electronically Signed   By: Logan Bores M.D.   On: 01/26/2020 15:57     Assessment and Recommendation  85 y.o. male with known coronary artery disease status post coronary artery bypass graft status post aortic valve prosthesis and pacemaker with paroxysmal nonvalvular atrial fibrillation having acute stroke and non-ST elevation myocardial infarction likely due to severe anemia now improved with episodes of wide-complex rapid heart rate asymptomatic but appears to be slightly improved with additional medication management 1..  Continue supportive care and rehabilitation for acute stroke with hemiparesis dysphagia as necessary without restriction 2.  Continue 50 mg of metoprolol twice per day for further treatment for non-ST elevation myocardial infarction, wide-complex tachycardia, and maintenance of  normal sinus rhythm with history of paroxysmal nonvalvular atrial fibrillation 3.  Continuation of anticoagulation with Eliquis 5 mg twice per day for further risk reduction of deep venous thrombosis and stroke with atrial fibrillation 4.  No further cardiac diagnostics necessary at this time due to no further episodes and or concern of worsening congestive heart failure and/or acute coronary syndrome 5.  Will follow telemetry for need for further adjustments of above  Signed, Serafina Royals M.D. FACC

## 2020-02-12 ENCOUNTER — Inpatient Hospital Stay: Payer: Medicare Other

## 2020-02-12 ENCOUNTER — Inpatient Hospital Stay
Admission: EM | Admit: 2020-02-12 | Discharge: 2020-03-09 | DRG: 871 | Disposition: E | Payer: Medicare Other | Attending: Internal Medicine | Admitting: Internal Medicine

## 2020-02-12 ENCOUNTER — Emergency Department: Payer: Medicare Other

## 2020-02-12 ENCOUNTER — Inpatient Hospital Stay: Payer: Self-pay

## 2020-02-12 ENCOUNTER — Other Ambulatory Visit: Payer: Self-pay

## 2020-02-12 DIAGNOSIS — Z7982 Long term (current) use of aspirin: Secondary | ICD-10-CM

## 2020-02-12 DIAGNOSIS — K219 Gastro-esophageal reflux disease without esophagitis: Secondary | ICD-10-CM | POA: Diagnosis present

## 2020-02-12 DIAGNOSIS — Z79899 Other long term (current) drug therapy: Secondary | ICD-10-CM

## 2020-02-12 DIAGNOSIS — D75838 Other thrombocytosis: Secondary | ICD-10-CM | POA: Diagnosis present

## 2020-02-12 DIAGNOSIS — N179 Acute kidney failure, unspecified: Secondary | ICD-10-CM | POA: Diagnosis present

## 2020-02-12 DIAGNOSIS — G9340 Encephalopathy, unspecified: Secondary | ICD-10-CM

## 2020-02-12 DIAGNOSIS — R52 Pain, unspecified: Secondary | ICD-10-CM | POA: Diagnosis not present

## 2020-02-12 DIAGNOSIS — Z66 Do not resuscitate: Secondary | ICD-10-CM | POA: Diagnosis present

## 2020-02-12 DIAGNOSIS — Z87891 Personal history of nicotine dependence: Secondary | ICD-10-CM | POA: Diagnosis not present

## 2020-02-12 DIAGNOSIS — Z20822 Contact with and (suspected) exposure to covid-19: Secondary | ICD-10-CM | POA: Diagnosis present

## 2020-02-12 DIAGNOSIS — A419 Sepsis, unspecified organism: Secondary | ICD-10-CM | POA: Diagnosis present

## 2020-02-12 DIAGNOSIS — E440 Moderate protein-calorie malnutrition: Secondary | ICD-10-CM | POA: Diagnosis present

## 2020-02-12 DIAGNOSIS — E872 Acidosis: Secondary | ICD-10-CM | POA: Diagnosis present

## 2020-02-12 DIAGNOSIS — Z79890 Hormone replacement therapy (postmenopausal): Secondary | ICD-10-CM

## 2020-02-12 DIAGNOSIS — J690 Pneumonitis due to inhalation of food and vomit: Secondary | ICD-10-CM | POA: Diagnosis present

## 2020-02-12 DIAGNOSIS — Z950 Presence of cardiac pacemaker: Secondary | ICD-10-CM | POA: Diagnosis not present

## 2020-02-12 DIAGNOSIS — R1310 Dysphagia, unspecified: Secondary | ICD-10-CM | POA: Diagnosis not present

## 2020-02-12 DIAGNOSIS — Z7189 Other specified counseling: Secondary | ICD-10-CM | POA: Diagnosis not present

## 2020-02-12 DIAGNOSIS — J189 Pneumonia, unspecified organism: Secondary | ICD-10-CM | POA: Diagnosis present

## 2020-02-12 DIAGNOSIS — R0689 Other abnormalities of breathing: Secondary | ICD-10-CM

## 2020-02-12 DIAGNOSIS — R6520 Severe sepsis without septic shock: Secondary | ICD-10-CM

## 2020-02-12 DIAGNOSIS — K922 Gastrointestinal hemorrhage, unspecified: Secondary | ICD-10-CM

## 2020-02-12 DIAGNOSIS — Z515 Encounter for palliative care: Secondary | ICD-10-CM

## 2020-02-12 DIAGNOSIS — R64 Cachexia: Secondary | ICD-10-CM | POA: Diagnosis present

## 2020-02-12 DIAGNOSIS — Z952 Presence of prosthetic heart valve: Secondary | ICD-10-CM

## 2020-02-12 DIAGNOSIS — Z7901 Long term (current) use of anticoagulants: Secondary | ICD-10-CM

## 2020-02-12 DIAGNOSIS — E785 Hyperlipidemia, unspecified: Secondary | ICD-10-CM | POA: Diagnosis present

## 2020-02-12 DIAGNOSIS — Z8673 Personal history of transient ischemic attack (TIA), and cerebral infarction without residual deficits: Secondary | ICD-10-CM

## 2020-02-12 DIAGNOSIS — Z9189 Other specified personal risk factors, not elsewhere classified: Secondary | ICD-10-CM | POA: Diagnosis not present

## 2020-02-12 DIAGNOSIS — I2510 Atherosclerotic heart disease of native coronary artery without angina pectoris: Secondary | ICD-10-CM | POA: Diagnosis present

## 2020-02-12 DIAGNOSIS — Z86718 Personal history of other venous thrombosis and embolism: Secondary | ICD-10-CM

## 2020-02-12 DIAGNOSIS — I69322 Dysarthria following cerebral infarction: Secondary | ICD-10-CM

## 2020-02-12 DIAGNOSIS — K920 Hematemesis: Secondary | ICD-10-CM | POA: Diagnosis present

## 2020-02-12 DIAGNOSIS — Z951 Presence of aortocoronary bypass graft: Secondary | ICD-10-CM | POA: Diagnosis not present

## 2020-02-12 DIAGNOSIS — E860 Dehydration: Secondary | ICD-10-CM | POA: Diagnosis present

## 2020-02-12 DIAGNOSIS — I69391 Dysphagia following cerebral infarction: Secondary | ICD-10-CM

## 2020-02-12 DIAGNOSIS — E039 Hypothyroidism, unspecified: Secondary | ICD-10-CM | POA: Diagnosis present

## 2020-02-12 DIAGNOSIS — Z95828 Presence of other vascular implants and grafts: Secondary | ICD-10-CM

## 2020-02-12 DIAGNOSIS — Z6823 Body mass index (BMI) 23.0-23.9, adult: Secondary | ICD-10-CM

## 2020-02-12 DIAGNOSIS — G9341 Metabolic encephalopathy: Secondary | ICD-10-CM | POA: Diagnosis present

## 2020-02-12 DIAGNOSIS — E1151 Type 2 diabetes mellitus with diabetic peripheral angiopathy without gangrene: Secondary | ICD-10-CM | POA: Diagnosis present

## 2020-02-12 DIAGNOSIS — I10 Essential (primary) hypertension: Secondary | ICD-10-CM | POA: Diagnosis present

## 2020-02-12 LAB — HEMOGLOBIN AND HEMATOCRIT, BLOOD
HCT: 35.3 % — ABNORMAL LOW (ref 39.0–52.0)
HCT: 32.4 % — ABNORMAL LOW (ref 39.0–52.0)
HCT: 27.9 % — ABNORMAL LOW (ref 39.0–52.0)
Hemoglobin: 10.6 g/dL — ABNORMAL LOW (ref 13.0–17.0)
Hemoglobin: 9.7 g/dL — ABNORMAL LOW (ref 13.0–17.0)
Hemoglobin: 8.2 g/dL — ABNORMAL LOW (ref 13.0–17.0)

## 2020-02-12 LAB — CBC WITH DIFFERENTIAL/PLATELET
Abs Immature Granulocytes: 0.09 10*3/uL — ABNORMAL HIGH (ref 0.00–0.07)
Basophils Absolute: 0 10*3/uL (ref 0.0–0.1)
Basophils Relative: 0 %
Eosinophils Absolute: 0 10*3/uL (ref 0.0–0.5)
Eosinophils Relative: 0 %
HCT: 37.5 % — ABNORMAL LOW (ref 39.0–52.0)
Hemoglobin: 10.8 g/dL — ABNORMAL LOW (ref 13.0–17.0)
Immature Granulocytes: 1 %
Lymphocytes Relative: 6 %
Lymphs Abs: 1.2 10*3/uL (ref 0.7–4.0)
MCH: 23.6 pg — ABNORMAL LOW (ref 26.0–34.0)
MCHC: 28.8 g/dL — ABNORMAL LOW (ref 30.0–36.0)
MCV: 82.1 fL (ref 80.0–100.0)
Monocytes Absolute: 0.8 10*3/uL (ref 0.1–1.0)
Monocytes Relative: 4 %
Neutro Abs: 16.7 10*3/uL — ABNORMAL HIGH (ref 1.7–7.7)
Neutrophils Relative %: 89 %
Platelets: 527 10*3/uL — ABNORMAL HIGH (ref 150–400)
RBC: 4.57 MIL/uL (ref 4.22–5.81)
RDW: 26.5 % — ABNORMAL HIGH (ref 11.5–15.5)
Smear Review: NORMAL
WBC: 18.8 10*3/uL — ABNORMAL HIGH (ref 4.0–10.5)
nRBC: 0 % (ref 0.0–0.2)

## 2020-02-12 LAB — BASIC METABOLIC PANEL
Anion gap: 11 (ref 5–15)
BUN: 19 mg/dL (ref 8–23)
CO2: 24 mmol/L (ref 22–32)
Calcium: 9.6 mg/dL (ref 8.9–10.3)
Chloride: 107 mmol/L (ref 98–111)
Creatinine, Ser: 1.18 mg/dL (ref 0.61–1.24)
GFR, Estimated: >60 mL/min (ref >60)
Glucose, Bld: 176 mg/dL — ABNORMAL HIGH (ref 70–99)
Potassium: 3.6 mmol/L (ref 3.5–5.1)
Sodium: 142 mmol/L (ref 135–145)

## 2020-02-12 LAB — URINALYSIS, COMPLETE (UACMP) WITH MICROSCOPIC
Bacteria, UA: NONE SEEN
Bilirubin Urine: NEGATIVE
Glucose, UA: NEGATIVE mg/dL
Ketones, ur: 5 mg/dL — AB
Leukocytes,Ua: NEGATIVE
Nitrite: NEGATIVE
Protein, ur: >=300 mg/dL — AB
Specific Gravity, Urine: 1.023 (ref 1.005–1.030)
Squamous Epithelial / HPF: NONE SEEN (ref 0–5)
pH: 5 (ref 5.0–8.0)

## 2020-02-12 LAB — COMPREHENSIVE METABOLIC PANEL
ALT: 21 U/L (ref 0–44)
AST: 29 U/L (ref 15–41)
Albumin: 3 g/dL — ABNORMAL LOW (ref 3.5–5.0)
Alkaline Phosphatase: 125 U/L (ref 38–126)
Anion gap: 10 (ref 5–15)
BUN: 19 mg/dL (ref 8–23)
CO2: 24 mmol/L (ref 22–32)
Calcium: 9.7 mg/dL (ref 8.9–10.3)
Chloride: 108 mmol/L (ref 98–111)
Creatinine, Ser: 1.31 mg/dL — ABNORMAL HIGH (ref 0.61–1.24)
GFR, Estimated: 54 mL/min — ABNORMAL LOW (ref >60)
Glucose, Bld: 159 mg/dL — ABNORMAL HIGH (ref 70–99)
Potassium: 3.8 mmol/L (ref 3.5–5.1)
Sodium: 142 mmol/L (ref 135–145)
Total Bilirubin: 1.8 mg/dL — ABNORMAL HIGH (ref 0.3–1.2)
Total Protein: 6.7 g/dL (ref 6.5–8.1)

## 2020-02-12 LAB — TYPE AND SCREEN
ABO/RH(D): AB POS
Antibody Screen: NEGATIVE

## 2020-02-12 LAB — CBG MONITORING, ED
Glucose-Capillary: 122 mg/dL — ABNORMAL HIGH (ref 70–99)
Glucose-Capillary: 115 mg/dL — ABNORMAL HIGH (ref 70–99)

## 2020-02-12 LAB — CBC
HCT: 35.2 % — ABNORMAL LOW (ref 39.0–52.0)
Hemoglobin: 10.3 g/dL — ABNORMAL LOW (ref 13.0–17.0)
MCH: 24 pg — ABNORMAL LOW (ref 26.0–34.0)
MCHC: 29.3 g/dL — ABNORMAL LOW (ref 30.0–36.0)
MCV: 81.9 fL (ref 80.0–100.0)
Platelets: 433 10*3/uL — ABNORMAL HIGH (ref 150–400)
RBC: 4.3 MIL/uL (ref 4.22–5.81)
RDW: 26.3 % — ABNORMAL HIGH (ref 11.5–15.5)
WBC: 16.1 10*3/uL — ABNORMAL HIGH (ref 4.0–10.5)
nRBC: 0.1 % (ref 0.0–0.2)

## 2020-02-12 LAB — RESP PANEL BY RT-PCR (FLU A&B, COVID) ARPGX2
Influenza A by PCR: NEGATIVE
Influenza B by PCR: NEGATIVE
SARS Coronavirus 2 by RT PCR: NEGATIVE

## 2020-02-12 LAB — PROTIME-INR
INR: 1.4 — ABNORMAL HIGH (ref 0.8–1.2)
Prothrombin Time: 16.9 seconds — ABNORMAL HIGH (ref 11.4–15.2)

## 2020-02-12 LAB — LACTIC ACID, PLASMA
Lactic Acid, Venous: 2.2 mmol/L (ref 0.5–1.9)
Lactic Acid, Venous: 1.9 mmol/L (ref 0.5–1.9)

## 2020-02-12 LAB — APTT: aPTT: 36 seconds (ref 24–36)

## 2020-02-12 LAB — STREP PNEUMONIAE URINARY ANTIGEN: Strep Pneumo Urinary Antigen: NEGATIVE

## 2020-02-12 LAB — GLUCOSE, CAPILLARY: Glucose-Capillary: 123 mg/dL — ABNORMAL HIGH (ref 70–99)

## 2020-02-12 MED ORDER — SODIUM CHLORIDE 0.9 % IV SOLN
8.0000 mg/h | INTRAVENOUS | Status: DC
  Administered 2020-02-12 – 2020-02-13 (×3): 8 mg/h via INTRAVENOUS
  Filled 2020-02-12 (×3): qty 80

## 2020-02-12 MED ORDER — TRAZODONE HCL 50 MG PO TABS: 25.0000 mg | ORAL_TABLET | Freq: Every evening | ORAL | Status: DC | PRN

## 2020-02-12 MED ORDER — ONDANSETRON HCL 4 MG/2ML IJ SOLN: 4.0000 mg | Freq: Four times a day (QID) | INTRAMUSCULAR | Status: DC | PRN

## 2020-02-12 MED ORDER — METOPROLOL TARTRATE 50 MG PO TABS
50.0000 mg | ORAL_TABLET | Freq: Two times a day (BID) | ORAL | Status: DC
  Administered 2020-02-12: 50 mg via ORAL
  Filled 2020-02-12: qty 1

## 2020-02-12 MED ORDER — SODIUM CHLORIDE 0.9 % IV SOLN
80.0000 mg | Freq: Once | INTRAVENOUS | Status: AC
  Administered 2020-02-12: 05:00:00 80 mg via INTRAVENOUS
  Filled 2020-02-12: qty 80

## 2020-02-12 MED ORDER — AMLODIPINE BESYLATE 10 MG PO TABS
10.0000 mg | ORAL_TABLET | Freq: Every day | ORAL | Status: DC
  Administered 2020-02-12: 10 mg via ORAL
  Filled 2020-02-12: qty 2

## 2020-02-12 MED ORDER — SODIUM CHLORIDE 0.9% FLUSH
10.0000 mL | Freq: Two times a day (BID) | INTRAVENOUS | Status: DC
  Administered 2020-02-12 – 2020-02-17 (×9): 10 mL

## 2020-02-12 MED ORDER — SODIUM CHLORIDE 0.9 % IV SOLN
3.0000 g | Freq: Four times a day (QID) | INTRAVENOUS | Status: DC
  Administered 2020-02-12 – 2020-02-13 (×5): 3 g via INTRAVENOUS
  Filled 2020-02-12 (×2): qty 8
  Filled 2020-02-12: qty 3
  Filled 2020-02-12: qty 8
  Filled 2020-02-12: qty 0.86
  Filled 2020-02-12: qty 3
  Filled 2020-02-12: qty 8

## 2020-02-12 MED ORDER — ACETAMINOPHEN 650 MG RE SUPP
650.0000 mg | Freq: Once | RECTAL | Status: AC
  Administered 2020-02-12: 650 mg via RECTAL
  Filled 2020-02-12: qty 1

## 2020-02-12 MED ORDER — VANCOMYCIN HCL IN DEXTROSE 1-5 GM/200ML-% IV SOLN
1000.0000 mg | Freq: Once | INTRAVENOUS | Status: AC
  Administered 2020-02-12: 1000 mg via INTRAVENOUS
  Filled 2020-02-12: qty 200

## 2020-02-12 MED ORDER — LEVOTHYROXINE SODIUM 50 MCG PO TABS: 50.0000 ug | ORAL_TABLET | Freq: Every day | ORAL | Status: DC

## 2020-02-12 MED ORDER — SODIUM CHLORIDE 0.9 % IV SOLN: 2.0000 g | INTRAVENOUS | Status: DC

## 2020-02-12 MED ORDER — ROSUVASTATIN CALCIUM 10 MG PO TABS
40.0000 mg | ORAL_TABLET | Freq: Every day | ORAL | Status: DC
  Filled 2020-02-12: qty 2

## 2020-02-12 MED ORDER — LACTATED RINGERS IV BOLUS
1000.0000 mL | Freq: Once | INTRAVENOUS | Status: AC
  Administered 2020-02-12: 1000 mL via INTRAVENOUS

## 2020-02-12 MED ORDER — SODIUM CHLORIDE 0.9% FLUSH: 10.0000 mL | INTRAVENOUS | Status: DC | PRN

## 2020-02-12 MED ORDER — CHLORHEXIDINE GLUCONATE CLOTH 2 % EX PADS
6.0000 | MEDICATED_PAD | Freq: Every day | CUTANEOUS | Status: DC
  Administered 2020-02-13: 6 via TOPICAL
  Filled 2020-02-12: qty 6

## 2020-02-12 MED ORDER — SODIUM CHLORIDE 0.9 % IV SOLN: INTRAVENOUS | Status: DC

## 2020-02-12 MED ORDER — ACETAMINOPHEN 325 MG PO TABS: 650.0000 mg | ORAL_TABLET | Freq: Four times a day (QID) | ORAL | Status: DC | PRN

## 2020-02-12 MED ORDER — SODIUM CHLORIDE 0.9 % IV SOLN
500.0000 mg | INTRAVENOUS | Status: DC
  Administered 2020-02-12 – 2020-02-13 (×2): 500 mg via INTRAVENOUS
  Filled 2020-02-12 (×2): qty 500

## 2020-02-12 MED ORDER — ACETAMINOPHEN 650 MG RE SUPP
650.0000 mg | Freq: Four times a day (QID) | RECTAL | Status: DC | PRN
  Administered 2020-02-15: 650 mg via RECTAL
  Filled 2020-02-12: qty 1

## 2020-02-12 MED ORDER — SODIUM CHLORIDE 0.9 % IV SOLN
1.0000 g | Freq: Once | INTRAVENOUS | Status: AC
  Administered 2020-02-12: 1 g via INTRAVENOUS
  Filled 2020-02-12: qty 1

## 2020-02-12 MED ORDER — ONDANSETRON HCL 4 MG PO TABS: 4.0000 mg | ORAL_TABLET | Freq: Four times a day (QID) | ORAL | Status: DC | PRN

## 2020-02-12 MED ORDER — INSULIN ASPART 100 UNIT/ML ~~LOC~~ SOLN: 0.0000 [IU] | Freq: Three times a day (TID) | SUBCUTANEOUS | Status: DC

## 2020-02-12 MED ORDER — PANTOPRAZOLE SODIUM 40 MG IV SOLR: 40.0000 mg | Freq: Two times a day (BID) | INTRAVENOUS | Status: DC

## 2020-02-12 MED ORDER — ADULT MULTIVITAMIN W/MINERALS CH
1.0000 | ORAL_TABLET | Freq: Every day | ORAL | Status: DC
  Filled 2020-02-12: qty 1

## 2020-02-12 MED ORDER — PANTOPRAZOLE SODIUM 40 MG PO TBEC
40.0000 mg | DELAYED_RELEASE_TABLET | Freq: Two times a day (BID) | ORAL | Status: DC
  Filled 2020-02-12: qty 1

## 2020-02-12 NOTE — ED Notes (Signed)
Pt has removed right forearm line. Additional line placed by this rn. Pt has a flaccid left arm and leg.

## 2020-02-12 NOTE — Progress Notes (Signed)
PMT consult received and chart reviewed. Visited with patient and spoke with son, [name] via telephone to discuss goals of care. Patient and [name] confirm DNR/DNI code status. Continue medical management and current plan of care. Watchful waiting. GI recommendations pending. Son interested in speech therapy evaluation to evaluate swallow. Prepared son for ongoing discussions regarding nutrition/aspiration risk. Will continue to follow this week.   Full consult note to follow.   NO CHARGE  [name], [place], FNP-C Palliative Medicine Team  Phone: [phone] Fax: [phone]

## 2020-02-12 NOTE — ED Notes (Signed)
Pt son updated at this time via phone.

## 2020-02-12 NOTE — Progress Notes (Signed)
Peripherally Inserted Central Catheter Placement  The IV Nurse has discussed with the patient and/or persons authorized to consent for the patient, the purpose of this procedure and the potential benefits and risks involved with this procedure.  The benefits include less needle sticks, lab draws from the catheter, and the patient may be discharged home with the catheter. Risks include, but not limited to, infection, bleeding, blood clot (thrombus formation), and puncture of an artery; nerve damage and irregular heartbeat and possibility to perform a PICC exchange if needed/ordered by physician.  Alternatives to this procedure were also discussed.  Bard Power PICC patient education guide, fact sheet on infection prevention and patient information card has been provided to patient /or left at bedside.  Consent obtained via telephone with son    PICC Placement Documentation  PICC Double Lumen 02/11/2020 PICC Right Brachial 37 cm 0 cm (Active)  Indication for Insertion or Continuance of Line Poor Vasculature-patient has had multiple peripheral attempts or PIVs lasting less than 24 hours 02/16/2020 1200  Exposed Catheter (cm) 0 cm 02/10/2020 1200  Site Assessment Clean;Dry;Intact 02/13/2020 1200  Lumen #1 Status Flushed;Saline locked;Blood return noted 02/25/2020 1200  Lumen #2 Status Flushed;Saline locked;Blood return noted 03/03/2020 1200  Dressing Type Transparent;Securing device 03/06/2020 1200  Dressing Status Clean;Intact;Dry 03/06/2020 1200  Antimicrobial disc in place? Yes 02/26/2020 1200  Safety Lock Not Applicable 81/27/51 7001  Line Care Connections checked and tightened 02/07/2020 1200  Dressing Intervention New dressing 03/05/2020 1200  Dressing Change Due 02/19/20 02/28/2020 1200       Holley Bouche Renee 02/26/2020, 12:09 PM

## 2020-02-12 NOTE — H&P (Signed)
Eric Kennedy   PATIENT NAME: Eric Kennedy    MR#:  [id number]  DATE OF BIRTH:  02-Feb-1935  DATE OF ADMISSION:  02/11/2020  PRIMARY CARE PHYSICIAN: [name], MD   REQUESTING/REFERRING PHYSICIAN: Rudene Re, MD  CHIEF COMPLAINT:   Chief Complaint  Patient presents with  . Fever  . Emesis    HISTORY OF PRESENT ILLNESS:  Eric Kennedy  is a 85 y.o. Caucasian male with a known history of type II diabetes mellitus and hypertension, CVA on aspirin and Eliquis and upper extremity DVT as well as history of GI bleeding from peptic ulcer disease presented to the emergency room with acute onset of hematemesis for 2 episodes without abdominal pain, melena or bright red bleeding per rectum.  The patient was having fever.  EMS.  He has been having heartburn.  He had a recent prolonged admission requiring intubation and ICU stay for hemorrhagic shock from peptic ulcer disease in the setting of acute stroke.  He was unable to provide any history at this time.  Upon presentation to the emergency room heart rate was 103 with respiratory rate of 23 and BP 141/67 with otherwise normal vital signs.  Labs revealed leukocytosis of 18.8 with neutrophilia, mild anemia better than his previous levels.  Lactic acid of 2.2 and later 1.9 and CMP remarkable for creatinine 1.31 and total protein of 6.7 with albumin of 3.  Influenza antigens and COVID-19 PCR came back negative.  Urinalysis showed more than 300 protein.  Portable chest ray showed interval slight worsening of the multifocal airspace opacities that could be due to infectious etiology, edema or atelectasis.  Blood culture was drawn.  EKG showed atrial flutter with variable AV block with a rate of 92, right bundle branch block and left anterior fascicular block anterior inversion anteroseptally.  The patient was given IV cefepime and vancomycin, 2 L bolus of IV lactated Ringer and 80 mg of IV Protonix followed by IV Protonix drip.  He  will be admitted to a progressive unit bed for further evaluation and management PAST MEDICAL HISTORY:   Past Medical History:  Diagnosis Date  . Diabetes mellitus without complication ([place])   . Hypertension     PAST SURGICAL HISTORY:   Past Surgical History:  Procedure Laterality Date  . CARDIAC SURGERY     triple bypass  . ESOPHAGOGASTRODUODENOSCOPY N/A 01/28/2020   Procedure: ESOPHAGOGASTRODUODENOSCOPY (EGD);  Surgeon: [name], [name], MD;  Location: [facility] ENDOSCOPY;  Service: Gastroenterology;  Laterality: N/A;    SOCIAL HISTORY:   Social History   Tobacco Use  . Smoking status: Former Research scientist (life sciences)  . Smokeless tobacco: Never Used  Substance Use Topics  . Alcohol use: Never    FAMILY HISTORY:   Family History  Problem Relation Age of Onset  . Diabetes Mother   . Cancer Brother     DRUG ALLERGIES:  No Known Allergies  REVIEW OF SYSTEMS:   ROS As per history of present illness otherwise unobtainable as the patient is a fairly poor historian.  MEDICATIONS AT HOME:   Prior to Admission medications   Medication Sig Start Date End Date Taking? Authorizing Provider  amLODipine (NORVASC) 10 MG tablet Take 10 mg by mouth daily.     [provider]  apixaban (ELIQUIS) 5 MG TABS tablet Take 2 tablets (10 mg total) by mouth 2 (two) times daily for 1 day. 10 mg po bid for 2 days f/by 5 mg po bid 02/07/20 02/08/20  Max Sane, MD  apixaban (ELIQUIS) 5 MG TABS tablet Take 1 tablet (5 mg total) by mouth 2 (two) times daily. 10 mg po bid for 2 days f/by 5 mg po bid 02/09/20 03/10/20  Max Sane, MD  aspirin EC 81 MG tablet Take 81 mg by mouth daily.     [provider]  levothyroxine (SYNTHROID) 50 MCG tablet Take 50 mcg by mouth daily.     [provider]  metoprolol tartrate (LOPRESSOR) 50 MG tablet Take 1 tablet (50 mg total) by mouth 2 (two) times daily. 02/07/20 03/08/20  Max Sane, MD  Multiple Vitamins-Minerals [facility] COMPLETE SENIOR MULTIVITS) TABS  Take 1 tablet by mouth daily.     [provider]  pantoprazole (PROTONIX) 40 MG tablet Take 1 tablet (40 mg total) by mouth 2 (two) times daily before a meal. 02/07/20 03/08/20  Max Sane, MD  rosuvastatin (CRESTOR) 40 MG tablet Take 40 mg by mouth daily.     [provider]  telmisartan (MICARDIS) 80 MG tablet Take 80 mg by mouth daily.     [provider]      VITAL SIGNS:  Blood pressure (!) 158/63, pulse (!) 105, temperature (!) 100.8 F (38.2 C), resp. rate (!) 33, height 5\' 10"  (1.778 m), weight 74 kg, SpO2 94 %.  PHYSICAL EXAMINATION:  Physical Exam  GENERAL:  85 y.o.-year-old Caucasian male patient lying in the bed with no acute distress.  He was fairly somnolent but arousable. EYES: Pupils equal, round, reactive to light and accommodation. No scleral icterus. Extraocular muscles intact.  HEENT: Head atraumatic, normocephalic. Oropharynx and nasopharynx clear.  NECK:  Supple, no jugular venous distention. No thyroid enlargement, no tenderness.  LUNGS: Normal breath sounds bilaterally, no wheezing, rales,rhonchi or crepitation. No use of accessory muscles of respiration.  CARDIOVASCULAR: Regular rate and rhythm, S1, S2 normal. No murmurs, rubs, or gallops.  ABDOMEN: Soft, nondistended, nontender. Bowel sounds present. No organomegaly or mass.  EXTREMITIES: No pedal edema, cyanosis, or clubbing.  NEUROLOGIC: Cranial nerves II through XII are intact. Muscle strength 5/5 in all extremities. Sensation intact. Gait not checked.  PSYCHIATRIC: The patient is alert and oriented x 3.  Normal affect and good eye contact. SKIN: No obvious rash, lesion, or ulcer.   LABORATORY PANEL:   CBC Recent Labs  Lab 02/07/2020 0150  WBC 18.8*  HGB 10.8*  HCT 37.5*  PLT 527*   ------------------------------------------------------------------------------------------------------------------  Chemistries  Recent Labs  Lab 02/07/2020 0150  NA 142  K 3.8  CL 108  CO2  24  GLUCOSE 159*  BUN 19  CREATININE 1.31*  CALCIUM 9.7  AST 29  ALT 21  ALKPHOS 125  BILITOT 1.8*   ------------------------------------------------------------------------------------------------------------------  Cardiac Enzymes No results for input(s): TROPONINI in the last 168 hours. ------------------------------------------------------------------------------------------------------------------  RADIOLOGY:  DG Chest 1 View  Result Date: 02/09/2020 CLINICAL DATA:  Fever EXAM: CHEST  1 VIEW COMPARISON:  January 27, 2020 FINDINGS: The heart size and mediastinal contours are unchanged with mild cardiomegaly. Aortic knob calcifications are seen. A left-sided pacemaker is seen. There is slight interval worsening in the hazy patchy airspace opacities seen throughout the right lung with stable airspace opacities at the left lung base. No pleural effusion. IMPRESSION: Interval slight worsening in the multifocal airspace opacities which could be due to infectious etiology, edema, or atelectasis Electronically Signed   By: Prudencio Pair M.D.   On: 02/23/2020 02:11      IMPRESSION AND PLAN:   1.  GI  bleeding with hematemesis. -The patient will be admitted to a progressive unit bed. -We will follow serial hemoglobins and hematocrits. -The patient will be continued on IV Protonix drip. -We will hold off his aspirin and Eliquis. -A GI consultation will be obtained. -I notified Dr. Allen Norris about the patient.  2.  Aspiration pneumonia possibly secondary to his vomiting, subsequent sepsis without severe sepsis or septic shock.  I doubt that this is healthcare associated pneumonia.  Sepsis manifested by tachycardia tachypnea and significant leukocytosis -We will place the patient on IV Unasyn and Zithromax at this time. -Mucolytic therapy will be provided. -O2 protocol will be followed.  3.  Mild acute kidney injury. -The patient will be hydrated with IV normal saline and will follow  BMPs. -We will hold off his myocarditis.  4.  Essential hypertension. -We will continue Norvasc and Lopressor and hold ARB given mild acute kidney injury..  5.  Dyslipidemia. -We will continue statin therapy.  6.  GERD with peptic ulcer disease. -The patient will be on IV PPI therapy as mentioned above.  7.  Hypothyroidism. -We will check TSH and continue Synthroid.  8.  DVT prophylaxis. -SCDs. -Medical prophylaxis is currently contraindicated due to GI bleeding.   All the records are reviewed and case discussed with ED provider. The plan of care was discussed in details with the patient (and family). I answered all questions. The patient agreed to proceed with the above mentioned plan. Further management will depend upon hospital course.   CODE STATUS: The patient is DNR/DNI.    Status is: Inpatient  Remains inpatient appropriate because:Ongoing diagnostic testing needed not appropriate for outpatient work up, Unsafe d/c plan, IV treatments appropriate due to intensity of illness or inability to take PO and Inpatient level of care appropriate due to severity of illness   Dispo: The patient is from: Home              Anticipated d/c is to: Home              Anticipated d/c date is: 2 days              Patient currently is not medically stable to d/c.   TOTAL TIME TAKING CARE OF THIS PATIENT: 55 minutes.    Christel Mormon M.D on 02/13/2020 at 3:23 AM  [facility]   From 7 PM-7 AM, contact night-coverage [email or web address]  CC: Primary care physician; [name], MD

## 2020-02-12 NOTE — ED Notes (Addendum)
Critical lactic of 2.2 called from pharmacy. Dr. Alfred Levins notified, no new orders received. Continue to await protonix from pharmacy.

## 2020-02-12 NOTE — ED Notes (Signed)
Pt cleansed of incontinent brown stool and small amount of tea colored urine. Face and hands cleansed of old vomit.

## 2020-02-12 NOTE — ED Notes (Signed)
Pt rolled onto left side and bed elevated to high fowler's position to prevent aspiration. Warm blankets provided. Bed in low andlocked position.

## 2020-02-12 NOTE — ED Notes (Signed)
Pt cleansed of small amount of dark brown emesis on cheek and blankets. Suction set up at bedside. Pt yelling every time he is touched to leave him alone and "ouch".

## 2020-02-12 NOTE — ED Notes (Signed)
Pt transferred over to hospital bed with new linen in place. Pt cleaned and new brief applied. External catheter in place and hooked to suction canister. Monitor cords repositioned for pt safety. Bed alarm on, fall bracelet and non-slip socks in place. Bed at lower position. Lights dimmed. (Safety mit on right hand to help prevent pt from pulling at lines)   Lorrie, RN made aware of above.

## 2020-02-12 NOTE — ED Notes (Signed)
ivf infusing without s/s of infiltration. Pt has difficulty keeping arm straight to ensure ivf infusion, multiple attempts made to ensure compliance.

## 2020-02-12 NOTE — Consult Note (Signed)
Eric Darby, MD [address]  [place], [place] [address]  Main: [phone]  Fax: [phone] Pager: [phone]   Consultation  Referring Provider:     No ref. provider found Primary Care Physician:  [name], MD Primary Gastroenterologist:  Dr. Alice Reichert         Reason for Consultation:     Coffee-ground emesis  Date of Admission:  02/11/2020 Date of Consultation:  02/11/2020         HPI:   Eric Kennedy is a 85 y.o. male with history of diabetes, hypertension, coronary artery disease s/p CABG, history of complete heart block, hypothyroidism, carotid artery stenosis, recent history of acute stroke in 01/2020, hemorrhagic shock from peptic ulcer disease.  Patient presented with fever and coffee-ground emesis at the nursing home.  Patient is started on broad-spectrum antibiotics per sepsis protocol and GI is consulted for evaluation of coffee-ground emesis.  Currently, on pantoprazole drip.  Recently admitted in 01/2020, underwent upper endoscopy for hematemesis, found to have nonbleeding gastric ulcer, and was discharged home on Protonix 40 twice daily.  Patient is also on Eliquis for history of stroke.  Work-up in the ER revealed mild lactic acidosis, neutrophilic leukocytosis as well as hemoglobin 10.8 which has improved compared to prior admission.  Normal BUN/creatinine.  He is also being treated for aspiration pneumonia   NSAIDs: None  Antiplts/Anticoagulants/Anti thrombotics: Aspirin and Eliquis  GI Procedures:  EGD 01/28/2020 Hiatal hernia, nonbleeding gastric ulcer  Past Medical History:  Diagnosis Date  . Diabetes mellitus without complication ([place])   . Hypertension     Past Surgical History:  Procedure Laterality Date  . CARDIAC SURGERY     triple bypass  . ESOPHAGOGASTRODUODENOSCOPY N/A 01/28/2020   Procedure: ESOPHAGOGASTRODUODENOSCOPY (EGD);  Surgeon: [name], [name], MD;  Location: [facility] ENDOSCOPY;  Service: Gastroenterology;   Laterality: N/A;    Current Facility-Administered Medications:  .  0.9 %  sodium chloride infusion, , Intravenous, Continuous, Fritzi Mandes, MD, Last Rate: 75 mL/hr at 02/16/2020 1403, New Bag at 02/13/2020 1403 .  acetaminophen (TYLENOL) tablet 650 mg, 650 mg, Oral, Q6H PRN **OR** acetaminophen (TYLENOL) suppository 650 mg, 650 mg, Rectal, Q6H PRN, Mansy, Jan A, MD .  amLODipine (NORVASC) tablet 10 mg, 10 mg, Oral, Daily, Mansy, Jan A, MD, 10 mg at 02/11/2020 1401 .  Ampicillin-Sulbactam (UNASYN) 3 g in sodium chloride 0.9 % 100 mL IVPB, 3 g, Intravenous, Q6H, Mansy, Jan A, MD, Last Rate: 200 mL/hr at 02/21/2020 1648, 3 g at 02/20/2020 1648 .  azithromycin (ZITHROMAX) 500 mg in sodium chloride 0.9 % 250 mL IVPB, 500 mg, Intravenous, Q24H, Mansy, Arvella Merles, MD, Stopped at 02/08/2020 1048 .  Chlorhexidine Gluconate Cloth 2 % PADS 6 each, 6 each, Topical, Daily, Fritzi Mandes, MD .  insulin aspart (novoLOG) injection 0-9 Units, 0-9 Units, Subcutaneous, TID WC, Mansy, Jan A, MD .  levothyroxine (SYNTHROID) tablet 50 mcg, 50 mcg, Oral, Q0600, Mansy, Jan A, MD .  metoprolol tartrate (LOPRESSOR) tablet 50 mg, 50 mg, Oral, BID, Mansy, Jan A, MD, 50 mg at 02/18/2020 1359 .  multivitamin with minerals tablet 1 tablet, 1 tablet, Oral, Daily, Mansy, Jan A, MD .  ondansetron (ZOFRAN) tablet 4 mg, 4 mg, Oral, Q6H PRN **OR** ondansetron (ZOFRAN) injection 4 mg, 4 mg, Intravenous, Q6H PRN, Mansy, Jan A, MD .  pantoprazole (PROTONIX) 80 mg in sodium chloride 0.9 % 100 mL (0.8 mg/mL) infusion, 8 mg/hr, Intravenous, Continuous, Alfred Levins, [place], MD,  Stopped at 02/23/2020 0943 .  pantoprazole (PROTONIX) EC tablet 40 mg, 40 mg, Oral, BID AC, Mansy, Jan A, MD .  Derrill Memo ON 02/15/2020] pantoprazole (PROTONIX) injection 40 mg, 40 mg, Intravenous, Q12H, [place], [place], MD .  rosuvastatin (CRESTOR) tablet 40 mg, 40 mg, Oral, Daily, Mansy, Jan A, MD .  sodium chloride flush (NS) 0.9 % injection 10-40 mL, 10-40 mL, Intracatheter, Q12H, Fritzi Mandes, MD, 10 mL at 02/24/2020 1406 .  sodium chloride flush (NS) 0.9 % injection 10-40 mL, 10-40 mL, Intracatheter, PRN, Fritzi Mandes, MD .  traZODone (DESYREL) tablet 25 mg, 25 mg, Oral, QHS PRN, Mansy, Jan A, MD  Current Outpatient Medications:  .  amLODipine (NORVASC) 10 MG tablet, Take 10 mg by mouth daily. , Disp: , Rfl:  .  apixaban (ELIQUIS) 5 MG TABS tablet, Take 1 tablet (5 mg total) by mouth 2 (two) times daily. 10 mg po bid for 2 days f/by 5 mg po bid, Disp: 60 tablet, Rfl: 0 .  aspirin EC 81 MG tablet, Take 81 mg by mouth daily. , Disp: , Rfl:  .  levothyroxine (SYNTHROID) 50 MCG tablet, Take 50 mcg by mouth daily. , Disp: , Rfl:  .  metoprolol tartrate (LOPRESSOR) 50 MG tablet, Take 1 tablet (50 mg total) by mouth 2 (two) times daily., Disp: 60 tablet, Rfl: 0 .  Multiple Vitamins-Minerals (PX COMPLETE SENIOR MULTIVITS) TABS, Take 1 tablet by mouth daily. , Disp: , Rfl:  .  pantoprazole (PROTONIX) 40 MG tablet, Take 1 tablet (40 mg total) by mouth 2 (two) times daily before a meal., Disp: 60 tablet, Rfl: 0 .  rosuvastatin (CRESTOR) 40 MG tablet, Take 40 mg by mouth daily. , Disp: , Rfl:  .  telmisartan (MICARDIS) 80 MG tablet, Take 80 mg by mouth daily. , Disp: , Rfl:  .  apixaban (ELIQUIS) 5 MG TABS tablet, Take 2 tablets (10 mg total) by mouth 2 (two) times daily for 1 day. 10 mg po bid for 2 days f/by 5 mg po bid, Disp: 4 tablet, Rfl: 0   Family History  Problem Relation Age of Onset  . Diabetes Mother   . Cancer Brother      Social History   Tobacco Use  . Smoking status: Former Research scientist (life sciences)  . Smokeless tobacco: Never Used  Substance Use Topics  . Alcohol use: Never  . Drug use: Never    Allergies as of 02/19/2020  . (No Known Allergies)    Review of Systems:    All systems reviewed and negative except where noted in HPI.   Physical Exam:  Vital signs in last 24 hours: Temp:  [98.7 F (37.1 C)-100.8 F (38.2 C)] 99.3 F (37.4 C) (12/06 1646) Pulse Rate:  [80-105]  80 (12/06 1630) Resp:  [19-33] 19 (12/06 1630) BP: (127-158)/(48-83) 156/48 (12/06 1630) SpO2:  [90 %-99 %] 97 % (12/06 1630) Weight:  [74 kg] 74 kg (12/06 0131)   General: Ill-appearing, minimally verbal, lethargic Head:  Normocephalic and atraumatic. Eyes:   No icterus.   Conjunctiva pink. PERRLA. Ears:  Normal auditory acuity. Neck:  Supple; no masses or thyroidomegaly Lungs: Respirations even and unlabored. Lungs clear to auscultation bilaterally.   No wheezes, crackles, or rhonchi.  Heart:  Regular rate and rhythm;  Without murmur, clicks, rubs or gallops Abdomen:  Soft, nondistended, nontender. Normal bowel sounds. No appreciable masses or hepatomegaly.  No rebound or guarding.  Rectal:  Not performed. Msk:  Symmetrical without gross deformities.  Extremities:  Without edema, cyanosis or clubbing. Neurologic:  Alert and oriented x1 Skin:  Intact without significant lesions or rashes.  LAB RESULTS: CBC Latest Ref Rng & Units 02/07/2020 03/03/2020 02/10/2020  WBC 4.0 - 10.5 K/uL - - 16.1(H)  Hemoglobin 13.0 - 17.0 g/dL 9.7(L) 10.6(L) 10.3(L)  Hematocrit 39 - 52 % 32.4(L) 35.3(L) 35.2(L)  Platelets 150 - 400 K/uL - - 433(H)    BMET BMP Latest Ref Rng & Units 02/21/2020 02/15/2020 02/07/2020  Glucose 70 - 99 mg/dL 176(H) 159(H) 107(H)  BUN 8 - 23 mg/dL _0 Creatinine 0.61 - 1.24 mg/dL 1.18 1.31(H) 0.95  Sodium 135 - 145 mmol/L 142 142 142  Potassium 3.5 - 5.1 mmol/L 3.6 3.8 3.5  Chloride 98 - 111 mmol/L 107 108 109  CO2 22 - 32 mmol/L _1 Calcium 8.9 - 10.3 mg/dL 9.6 9.7 9.1    LFT Hepatic Function Latest Ref Rng & Units 02/07/2020 01/26/2020  Total Protein 6.5 - 8.1 g/dL 6.7 6.2(L)  Albumin 3.5 - 5.0 g/dL 3.0(L) 3.4(L)  AST 15 - 41 U/L 29 57(H)  ALT 0 - 44 U/L 21 19  Alk Phosphatase 38 - 126 U/L 125 81  Total Bilirubin 0.3 - 1.2 mg/dL 1.8(H) 1.9(H)     STUDIES: DG Chest 1 View  Result Date: 03/01/2020 CLINICAL DATA:  Fever EXAM: CHEST  1 VIEW COMPARISON:   January 27, 2020 FINDINGS: The heart size and mediastinal contours are unchanged with mild cardiomegaly. Aortic knob calcifications are seen. A left-sided pacemaker is seen. There is slight interval worsening in the hazy patchy airspace opacities seen throughout the right lung with stable airspace opacities at the left lung base. No pleural effusion. IMPRESSION: Interval slight worsening in the multifocal airspace opacities which could be due to infectious etiology, edema, or atelectasis Electronically Signed   By: Prudencio Pair M.D.   On: 02/23/2020 02:11   DG Chest Port 1 View  Result Date: 02/27/2020 CLINICAL DATA:  PICC line placement. EXAM: PORTABLE CHEST 1 VIEW COMPARISON:  Earlier film, same date. FINDINGS: The right-sided PICC line tip is in distal SVC near the cavoatrial junction. No complicating features. Stable interstitial and airspace process in the lungs. IMPRESSION: PICC line tip is in the distal SVC near the cavoatrial junction. Electronically Signed   By: Marijo Sanes M.D.   On: 03/06/2020 12:48   [place] EKG SITE RITE  Result Date: 02/24/2020 If Site Rite image not attached, placement could not be confirmed due to current cardiac rhythm.     Impression / Plan:   Eric Kennedy is a 85 y.o. male with history of diabetes, ischemic stroke, right MCA territory on aspirin and Eliquis, history of left upper extremity DVT presented with aspiration pneumonia and coffee-ground emesis, DNR/DNI  Coffee-ground emesis: Hemodynamically insignificant upper GI bleed Known history of gastric ulcer and other differentials include erosive esophagitis or gastritis Hemoglobin is stable at baseline Aspirin and Eliquis have been held in setting of coffee-ground emesis Continue pantoprazole drip for next 24 hours, switch to Protonix 40 mg twice daily indefinitely If patient's hemoglobin remained stable with no recurrent episodes of coffee-ground emesis, do not recommend upper endoscopy given multiple  comorbidities as risks overweigh the benefits Palliative care consult is appropriate N.p.o. for now, pending speech path evaluation  Thank you for involving me in the care of this patient.  GI will follow along with you    LOS: 0 days   Sherri Sear, MD  02/24/2020, 5:13 PM  Note: This dictation was prepared with Dragon dictation along with smaller phrase technology. Any transcriptional errors that result from this process are unintentional.

## 2020-02-12 NOTE — ED Notes (Signed)
Report to lorrie, rn. maxipime continues to infuse with ivf.

## 2020-02-12 NOTE — ED Notes (Signed)
Pharmacy contacted at this time regarding missing protonix medications. Pharmacy to send to ed

## 2020-02-12 NOTE — ED Notes (Signed)
Several RNs have attempt iv access without success. Larene Beach, rn to attempt ultrasound guided iv access.

## 2020-02-12 NOTE — Consult Note (Signed)
Consultation Note Date: 02/08/2020   Patient Name: Eric Kennedy  DOB: Apr 01, 1934  MRN: [id number]  Age / Sex: 85 y.o., male  PCP: [name], MD Referring Physician: Fritzi Mandes, MD  Reason for Consultation: Establishing goals of care  HPI/Patient Profile: 85 y.o. male  with past medical history of HTN, DM type 2, DVT on Eliquis, recent CVA November 2021, GIB from peptic ulcer disease, recent hospitalization requiring ICU and intubation for hemorrhagic shock from peptic ulcer disease following acute stroke admitted on 03/03/2020 with vomiting and coffee ground emesis. Hospital admission for GI bleeding with hematemesis and aspiration pneumonia. Recent EGD revealed gastric ulcer without bleeding and gastritis. GI evaluation pending. Palliative medicine consultation for goals of care.   Clinical Assessment and Goals of Care:  I have reviewed medical records, discussed with care team, and met patient at bedside. Eric Kennedy does wake to voice. He is oriented to name, hospital, and year. Reoriented him to situation. He denies pain or discomfort. He answers simple yes/no questions. He does confirm DNR/DNI code status. Waiting on admission. No family at bedside.   Patient known to PMT from previous admission. Son lives in [place]. Spoke with son, Eric Kennedy via telephone to discuss goals of care.   I introduced Palliative Medicine as specialized medical care for people living with serious illness. It focuses on providing relief from the symptoms and stress of a serious illness. The goal is to improve quality of life for both the patient and the family. He is familiar with PMT from previous admission.   Married but per chart review, wife with mild cognitive delay. Eric Kennedy has a sister but she also lives in [place] and has not been in contact with Eric Kennedy for approximately 10 years.   Discussed events from recent  hospitalization including diagnoses, interventions, plan of care. Minimal progression since discharged to SNF rehab.  Discussed events leading up to admission and course of hospitalization including diagnoses, interventions, plan of care. Discussed concern with aspiration and reviewed SLP notes from last admission.    I attempted to elicit values and goals of care important to the patient and son. Eric Kennedy confirms his father's decision for DNR/DNI code status. Discussed feeding tubes. Eric Kennedy shares he spoke with a previous provider against feeding tube placement with risk for bleeding. Agreed with this and also shared other associated risks including ongoing aspiration, infection, and not impacting quality of life following health decline in the last few weeks-months.   Eric Kennedy shares that when he was visiting [place], he assisted his father with meals and did not notice any coughing/choking. Explained that sometimes individuals can silently aspirate. Eric Kennedy is interested in f/u SLP evaluation. We discussed watchful waiting. Also waiting on GI recommendations.   Answered questions and reassured Eric Kennedy of ongoing palliative support this admission. PMT contact information given.    SUMMARY OF RECOMMENDATIONS    DNR/DNI  Continue current plan of care and medical management.  Son requesting f/u SLP evaluation. Discussed with Dr. Posey Pronto and SLP Belenda Cruise.  Pending GI  recommendations.  PMT will continue to follow this week. Prepared son for concern with ongoing aspiration and future decisions regarding nutritional status/feedings following SLP recommendations.   Code Status/Advance Care Planning:  DNR/DNI: confirmed by patient and son  Symptom Management:   Per attending  Palliative Prophylaxis:   Aspiration, Bowel Regimen, Delirium Protocol, Frequent Pain Assessment, Oral Care and Turn Reposition  Psycho-social/Spiritual:   Desire for further Chaplaincy support: yes  Additional Recommendations:  Caregiving  Support/Resources, Compassionate Geneticist, molecular and Education on Hospice  Prognosis:   Guarded-poor  Discharge Planning: To Be Determined      Primary Diagnoses: Present on Admission:  Aspiration pneumonia ([place])  Pneumonia   I have reviewed the medical record, interviewed the patient and family, and examined the patient. The following aspects are pertinent.  Past Medical History:  Diagnosis Date   Diabetes mellitus without complication ([place])    Hypertension    Social History   Socioeconomic History   Marital status: Married    Spouse name: Not on file   Number of children: Not on file   Years of education: Not on file   Highest education level: Not on file  Occupational History   Not on file  Tobacco Use   Smoking status: Former Smoker   Smokeless tobacco: Never Used  Substance and Sexual Activity   Alcohol use: Never   Drug use: Never   Sexual activity: Not on file  Other Topics Concern   Not on file  Social History Narrative   Not on file   Social Determinants of Health   Financial Resource Strain:    Difficulty of Paying Living Expenses: Not on file  Food Insecurity:    Worried About [place] in the Last Year: Not on file   [employer] of Food in the Last Year: Not on file  Transportation Needs:    Lack of Transportation (Medical): Not on file   Lack of Transportation (Non-Medical): Not on file  Physical Activity:    Days of Exercise per Week: Not on file   Minutes of Exercise per Session: Not on file  Stress:    Feeling of Stress : Not on file  Social Connections:    Frequency of Communication with Friends and Family: Not on file   Frequency of Social Gatherings with Friends and Family: Not on file   Attends Religious Services: Not on file   Active Member of Clubs or Organizations: Not on file   Attends Archivist Meetings: Not on file   Marital Status: Not on file   Family History   Problem Relation Age of Onset   Diabetes Mother    Cancer Brother    Scheduled Meds:  amLODipine  10 mg Oral Daily   Chlorhexidine Gluconate Cloth  6 each Topical Daily   insulin aspart  0-9 Units Subcutaneous TID WC   levothyroxine  50 mcg Oral Q0600   metoprolol tartrate  50 mg Oral BID   multivitamin with minerals  1 tablet Oral Daily   pantoprazole  40 mg Oral BID AC   [START ON 02/15/2020] pantoprazole  40 mg Intravenous Q12H   rosuvastatin  40 mg Oral Daily   sodium chloride flush  10-40 mL Intracatheter Q12H   Continuous Infusions:  sodium chloride 75 mL/hr at 02/20/2020 1403   ampicillin-sulbactam (UNASYN) IV Stopped (02/16/2020 1130)   azithromycin Stopped (02/27/2020 1048)   pantoprozole (PROTONIX) infusion Stopped (02/25/2020 0943)   PRN Meds:.acetaminophen **OR** acetaminophen, ondansetron **OR** ondansetron (  ZOFRAN) IV, sodium chloride flush, traZODone Medications Prior to Admission:  Prior to Admission medications   Medication Sig Start Date End Date Taking? Authorizing Provider  amLODipine (NORVASC) 10 MG tablet Take 10 mg by mouth daily.    Yes [provider]  apixaban (ELIQUIS) 5 MG TABS tablet Take 1 tablet (5 mg total) by mouth 2 (two) times daily. 10 mg po bid for 2 days f/by 5 mg po bid 02/09/20 03/10/20 Yes Max Sane, MD  aspirin EC 81 MG tablet Take 81 mg by mouth daily.    Yes [provider]  levothyroxine (SYNTHROID) 50 MCG tablet Take 50 mcg by mouth daily.    Yes [provider]  metoprolol tartrate (LOPRESSOR) 50 MG tablet Take 1 tablet (50 mg total) by mouth 2 (two) times daily. 02/07/20 03/08/20 Yes Max Sane, MD  Multiple Vitamins-Minerals (PX COMPLETE SENIOR MULTIVITS) TABS Take 1 tablet by mouth daily.    Yes [provider]  pantoprazole (PROTONIX) 40 MG tablet Take 1 tablet (40 mg total) by mouth 2 (two) times daily before a meal. 02/07/20 03/08/20 Yes Max Sane, MD  rosuvastatin (CRESTOR) 40 MG tablet  Take 40 mg by mouth daily.    Yes [provider]  telmisartan (MICARDIS) 80 MG tablet Take 80 mg by mouth daily.    Yes [provider]  apixaban (ELIQUIS) 5 MG TABS tablet Take 2 tablets (10 mg total) by mouth 2 (two) times daily for 1 day. 10 mg po bid for 2 days f/by 5 mg po bid 02/07/20 02/08/20  Max Sane, MD   No Known Allergies Review of Systems  Unable to perform ROS: Acuity of condition    Physical Exam Vitals and nursing note reviewed.  Constitutional:      Appearance: He is cachectic. He is ill-appearing.  HENT:     Head: Normocephalic and atraumatic.  Cardiovascular:     Rate and Rhythm: Normal rate.  Pulmonary:     Effort: No tachypnea, accessory muscle usage or respiratory distress.     Breath sounds: Decreased breath sounds present.  Abdominal:     Tenderness: There is no abdominal tenderness.  Skin:    General: Skin is warm and dry.     Coloration: Skin is pale.  Neurological:     Comments: Oriented to person/place/year  Psychiatric:        Mood and Affect: Mood normal.        Speech: Speech normal.    Vital Signs: BP (!) 149/52    Pulse 97    Temp 98.7 F (37.1 C) (Oral)    Resp (!) 22    Ht '5\' 10"'  (1.778 m)    Wt 74 kg    SpO2 96%    BMI 23.41 kg/m  Pain Scale: 0-10   Pain Score: 0-No pain   SpO2: SpO2: 96 % O2 Device:SpO2: 96 % O2 Flow Rate: .   IO: Intake/output summary:   Intake/Output Summary (Last 24 hours) at 02/10/2020 1600 Last data filed at 02/26/2020 1130 Gross per 24 hour  Intake 2637.18 ml  Output --  Net 2637.18 ml    LBM:   Baseline Weight: Weight: 74 kg Most recent weight: Weight: 74 kg     Palliative Assessment/Data: PPS 30%    Time Total: 60 Greater than 50%  of this time was spent counseling and coordinating care related to the above assessment and plan.  Signed by:  [name], DNP, FNP-C Palliative Medicine Team  Phone:  [phone] Fax: [phone]   Please contact Palliative Medicine Team  phone at [phone] for questions and concerns.  For individual provider: See Shea Evans

## 2020-02-12 NOTE — ED Triage Notes (Signed)
[facility] from [facility] with fever and vomiting. Per ems vomiting was "coffee ground". Pt with dried vomit, brown on fingers and chin.

## 2020-02-12 NOTE — ED Provider Notes (Signed)
[facility] Emergency Department Provider Note  ____________________________________________  Time seen: Approximately 2:46 AM  I have reviewed the triage vital signs and the nursing notes.   HISTORY  Chief Complaint Fever and Emesis  Level 5 caveat:  Portions of the history and physical were unable to be obtained due to acute illness, confusion   HPI Eric Kennedy is a 85 y.o. male with a history of a CVA on aspirin Eliquis, upper extremity DVT, upper GI bleed from peptic ulcer disease, diabetes who presents from his nursing home for fever and hematemesis.   Patient with recent prolonged admission requiring intubation and ICU stay for hemorrhagic shock from peptic ulcer disease in the setting of acute stroke.  Today patient was noted to have coffee-ground emesis at the nursing home which prompted 911 to be called.  Patient found to be febrile by EMS. Patient complaining of burning chest pain. Unable to provide any further history.  Past Medical History:  Diagnosis Date  . Diabetes mellitus without complication ([place])   . Hypertension     Patient Active Problem List   Diagnosis Date Noted  . Malnutrition of moderate degree 02/06/2020  . Cerebral ischemic stroke due to global hypoperfusion with watershed infarct [facility])   . Failure to thrive (child)   . Goals of care, counseling/discussion   . Palliative care by specialist   . DNR (do not resuscitate)   . Acute hypoxemic respiratory failure ([place]) 01/30/2020  . Acute blood loss anemia 01/30/2020  . Acute right MCA stroke ([place]) 01/30/2020  . Upper GI bleed 01/26/2020  . Complete heart block ([place]) 06/16/2018  . Healthcare maintenance 06/06/2018  . Controlled type 2 diabetes mellitus with complication, without long-term current use of insulin ([place]) 12/02/2017  . Hypertension, well controlled 12/02/2017  . Chronic anemia 06/01/2017  . 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 Vitamin B 12 deficiency 11/26/2015  .  Pure hypercholesterolemia 08/27/2015  . Severe aortic valve stenosis 06/18/2015  . Acquired hypothyroidism 02/14/2014  . Adhesive capsulitis of left shoulder 08/25/2013  . 3-vessel CAD 05/16/2013  . Hx of CABG 05/16/2013  . Carotid stenosis 05/16/2013  . Paroxysmal atrial fibrillation ([place]) 05/16/2013    Past Surgical History:  Procedure Laterality Date  . CARDIAC SURGERY     triple bypass  . ESOPHAGOGASTRODUODENOSCOPY N/A 01/28/2020   Procedure: ESOPHAGOGASTRODUODENOSCOPY (EGD);  Surgeon: [name], [name], MD;  Location: [facility] ENDOSCOPY;  Service: Gastroenterology;  Laterality: N/A;    Prior to Admission medications   Medication Sig Start Date End Date Taking? Authorizing Provider  amLODipine (NORVASC) 10 MG tablet Take 10 mg by mouth daily.     [provider]  apixaban (ELIQUIS) 5 MG TABS tablet Take 2 tablets (10 mg total) by mouth 2 (two) times daily for 1 day. 10 mg po bid for 2 days f/by 5 mg po bid 02/07/20 02/08/20  Max Sane, MD  apixaban (ELIQUIS) 5 MG TABS tablet Take 1 tablet (5 mg total) by mouth 2 (two) times daily. 10 mg po bid for 2 days f/by 5 mg po bid 02/09/20 03/10/20  Max Sane, MD  aspirin EC 81 MG tablet Take 81 mg by mouth daily.     [provider]  levothyroxine (SYNTHROID) 50 MCG tablet Take 50 mcg by mouth daily.     [provider]  metoprolol tartrate (LOPRESSOR) 50 MG tablet Take 1 tablet (50 mg total) by mouth 2 (two) times daily. 02/07/20 03/08/20  Max Sane, MD  Multiple Vitamins-Minerals (  PX COMPLETE SENIOR MULTIVITS) TABS Take 1 tablet by mouth daily.     [provider]  pantoprazole (PROTONIX) 40 MG tablet Take 1 tablet (40 mg total) by mouth 2 (two) times daily before a meal. 02/07/20 03/08/20  Max Sane, MD  rosuvastatin (CRESTOR) 40 MG tablet Take 40 mg by mouth daily.     [provider]  telmisartan (MICARDIS) 80 MG tablet Take 80 mg by mouth daily.     [provider]     Allergies Patient has no known allergies.  Family History  Problem Relation Age of Onset  . Diabetes Mother   . Cancer Brother     Social History Social History   Tobacco Use  . Smoking status: Former Research scientist (life sciences)  . Smokeless tobacco: Never Used  Substance Use Topics  . Alcohol use: Never  . Drug use: Never    Review of Systems  Constitutional: + fever. Cardiovascular: + burning chest pain. Respiratory: Negative for shortness of breath. Gastrointestinal: + hematemesis   Level 5 caveat:  Portions of the history and physical were unable to be obtained due to confusion  ____________________________________________   PHYSICAL EXAM:  VITAL SIGNS: ED Triage Vitals  Enc Vitals Group     BP 02/18/2020 0130 (!) 144/83     Pulse Rate 02/20/2020 0130 92     Resp 02/23/2020 0130 (!) 22     Temp 02/22/2020 0130 (!) 100.8 F (38.2 C)     Temp src --      SpO2 03/07/2020 0130 90 %     Weight 02/23/2020 0131 163 lb 2.3 oz (74 kg)     Height 02/27/2020 0131 5\' 10"  (1.778 m)     Head Circumference --      Peak Flow --      Pain Score --      Pain Loc --      Pain Edu? --      Excl. in [place]? --     Constitutional: Ill appearing HEENT:      Head: Normocephalic and atraumatic.         Eyes: Conjunctivae are normal. Sclera is non-icteric.       Mouth/Throat: Mucous membranes are dry.       Neck: Supple with no signs of meningismus. Cardiovascular: Regular rate and rhythm. No murmurs, gallops, or rubs.  Respiratory: Increased work of breathing, tachypneic, satting in the mid 90s, crackles bilaterally Gastrointestinal: Soft, non distended  Genitourinary: Brown stool guaiac Musculoskeletal:  No edema, cyanosis, or erythema of extremities. Neurologic: Normal speech. Face is symmetric.  Skin: Skin is warm, dry and intact. No rash noted. Psychiatric: Mood and affect are normal. Speech and behavior are normal.  ____________________________________________   LABS (all labs ordered are  listed, but only abnormal results are displayed)  Labs Reviewed  LACTIC ACID, PLASMA - Abnormal; Notable for the following components:      Result Value   Lactic Acid, Venous 2.2 (*)    All other components within normal limits  CBC WITH DIFFERENTIAL/PLATELET - Abnormal; Notable for the following components:   WBC 18.8 (*)    Hemoglobin 10.8 (*)    HCT 37.5 (*)    MCH 23.6 (*)    MCHC 28.8 (*)    RDW 26.5 (*)    Platelets 527 (*)    Neutro Abs 16.7 (*)    Abs Immature Granulocytes 0.09 (*)    All other components within normal limits  PROTIME-INR - Abnormal; Notable for the  following components:   Prothrombin Time 16.9 (*)    INR 1.4 (*)    All other components within normal limits  URINALYSIS, COMPLETE (UACMP) WITH MICROSCOPIC - Abnormal; Notable for the following components:   Color, Urine AMBER (*)    APPearance HAZY (*)    Hgb urine dipstick LARGE (*)    Ketones, ur 5 (*)    Protein, ur >=300 (*)    All other components within normal limits  RESP PANEL BY RT-PCR (FLU A&B, COVID) ARPGX2  CULTURE, BLOOD (ROUTINE X 2)  CULTURE, BLOOD (ROUTINE X 2)  APTT  COMPREHENSIVE METABOLIC PANEL  LACTIC ACID, PLASMA  TYPE AND SCREEN   ____________________________________________  EKG  ED ECG REPORT I, Rudene Re, the attending physician, personally viewed and interpreted this ECG.  Sinus rhythm with RBBB, LAFB, prolonged QTC, no concordant ST elevation.  Findings are new when compared to prior from 3 weeks ago. ____________________________________________  RADIOLOGY  I have personally reviewed the images performed during this visit and I agree with the Radiologist's read.   Interpretation by Radiologist:  DG Chest 1 View  Result Date: 02/13/2020 CLINICAL DATA:  Fever EXAM: CHEST  1 VIEW COMPARISON:  January 27, 2020 FINDINGS: The heart size and mediastinal contours are unchanged with mild cardiomegaly. Aortic knob calcifications are seen. A left-sided pacemaker is  seen. There is slight interval worsening in the hazy patchy airspace opacities seen throughout the right lung with stable airspace opacities at the left lung base. No pleural effusion. IMPRESSION: Interval slight worsening in the multifocal airspace opacities which could be due to infectious etiology, edema, or atelectasis Electronically Signed   By: Prudencio Pair M.D.   On: 02/22/2020 02:11     ____________________________________________   PROCEDURES  Procedure(s) performed:yes .1-3 Lead EKG Interpretation Performed by: Rudene Re, MD Authorized by: Rudene Re, MD     Interpretation: abnormal     ECG rate assessment: normal     Rhythm: sinus rhythm     Conduction: abnormal     Critical Care performed: yes  CRITICAL CARE Performed by: Rudene Re  ?  Total critical care time: 50 min  Critical care time was exclusive of separately billable procedures and treating other patients.  Critical care was necessary to treat or prevent imminent or life-threatening deterioration.  Critical care was time spent personally by me on the following activities: development of treatment plan with patient and/or surrogate as well as nursing, discussions with consultants, evaluation of patient's response to treatment, examination of patient, obtaining history from patient or surrogate, ordering and performing treatments and interventions, ordering and review of laboratory studies, ordering and review of radiographic studies, pulse oximetry and re-evaluation of patient's condition.  ____________________________________________   INITIAL IMPRESSION / ASSESSMENT AND PLAN / ED COURSE   85 y.o. male with a history of a CVA on aspirin Eliquis, upper extremity DVT, upper GI bleed from peptic ulcer disease, diabetes who presents from his nursing home for fever and hematemesis.  On arrival to the emergency room patient meets sepsis criteria with fever, tachycardia, tachypnea.   Normotensive and with normal sats.  Crackles bilaterally.  Patient was started on sepsis protocol.  Rectal exam showing brown stool guaiac negative.  Review of most recent hospitalization shows an endoscopy with a history of ulcer disease.  Patient has no history of cirrhosis.  Patient was given Protonix bolus and drip.  Hemoglobin is stable at this time at 10.8 with no indication for transfusion.  Patient is on Eliquis  and aspirin for his acute stroke.  Type and cross active.  Chest x-ray visualized by me concerning for pneumonia, confirmed by radiology.  Covid and flu negative.  Due to recent hospitalization and intubation patient was covered for HCAP with cefepime and vancomycin.  Patient started on IV fluids.  White count of eighteen with left shift also thrombocytosis most likely reactive in the setting of sepsis with a lactic of 2.2.  Patient placed on telemetry for close monitoring.  Will discuss to hospitalist for admission.      _____________________________________________ Please note:  Patient was evaluated in Emergency Department today for the symptoms described in the history of present illness. Patient was evaluated in the context of the global COVID-19 pandemic, which necessitated consideration that the patient might be at risk for infection with the SARS-CoV-2 virus that causes COVID-19. Institutional protocols and algorithms that pertain to the evaluation of patients at risk for COVID-19 are in a state of rapid change based on information released by regulatory bodies including the CDC and federal and state organizations. These policies and algorithms were followed during the patient's care in the ED.  Some ED evaluations and interventions may be delayed as a result of limited staffing during the pandemic.   [place] Controlled Substance Database was reviewed by me. ____________________________________________   FINAL CLINICAL IMPRESSION(S) / ED DIAGNOSES   Final diagnoses:  Sepsis with  encephalopathy without septic shock, due to unspecified organism ([place])  Hematemesis with nausea  HCAP (healthcare-associated pneumonia)      NEW MEDICATIONS STARTED DURING THIS VISIT:  ED Discharge Orders    None       Note:  This document was prepared using Dragon voice recognition software and may include unintentional dictation errors.    Alfred Levins, [place], MD 03/05/2020 [phone]

## 2020-02-12 NOTE — ED Notes (Signed)
Waiting on protonix from pharmacy

## 2020-02-12 NOTE — Progress Notes (Signed)
[place] [facility] [place] NAME: Eric Kennedy    MR#:  [id number]  [place]:  [birth year]  SUBJECTIVE:  patient was admitted from November 19 to December 1 with multiple medical problems discharge to rehab comes in with vomiting/coffee ground/hematemesis. He is on eliquis. Patient is very lethargic during my evaluation. No family in the ER. History is obtained from reviewing notes.  Currently on IV antibiotics, IV Protonix drip  REVIEW OF SYSTEMS:   Review of Systems  Unable to perform ROS: Medical condition   Tolerating Diet: Tolerating PT:   DRUG ALLERGIES:  No Known Allergies  VITALS:  Blood pressure (!) 156/48, pulse 80, temperature 99.3 F (37.4 C), temperature source Oral, resp. rate 19, height 5\' 10"  (1.778 m), weight 74 kg, SpO2 97 %.  PHYSICAL EXAMINATION:   Physical Exam  GENERAL:  85 y.o.-year-old patient lying in the bed with no acute distress. Frail cachectic HEENT: Head atraumatic, normocephalic. Oropharynx and nasopharynx clear.  LUNGS: Normal breath sounds bilaterally, no wheezing, rales, rhonchi. No use of accessory muscles of respiration.  CARDIOVASCULAR: S1, S2 normal. No murmurs, rubs, or gallops.  ABDOMEN: Soft, nontender, nondistended. Bowel sounds present. No organomegaly or mass.  EXTREMITIES: No cyanosis, clubbing or edema b/l.   PICC+ right UE NEUROLOGIC: unable to assess today due to MS PSYCHIATRIC:  patient is lethargic SKIN: per RN  LABORATORY PANEL:  CBC Recent Labs  Lab 03/04/2020 0500 02/08/2020 0546 02/23/2020 1254  WBC 16.1*  --   --   HGB 10.3*   < > 9.7*  HCT 35.2*   < > 32.4*  PLT 433*  --   --    < > = values in this interval not displayed.    Chemistries  Recent Labs  Lab 02/26/2020 0150 02/27/2020 0150 03/08/2020 0500  NA 142   < > 142  K 3.8   < > 3.6  CL 108   < > 107  CO2 24   < > 24  GLUCOSE 159*   < > 176*  BUN 19   < > 19  CREATININE 1.31*   < > 1.18  CALCIUM 9.7   < > 9.6   AST 29  --   --   ALT 21  --   --   ALKPHOS 125  --   --   BILITOT 1.8*  --   --    < > = values in this interval not displayed.   Cardiac Enzymes No results for input(s): TROPONINI in the last 168 hours. RADIOLOGY:  DG Chest 1 View  Result Date: 02/27/2020 CLINICAL DATA:  Fever EXAM: CHEST  1 VIEW COMPARISON:  January 27, 2020 FINDINGS: The heart size and mediastinal contours are unchanged with mild cardiomegaly. Aortic knob calcifications are seen. A left-sided pacemaker is seen. There is slight interval worsening in the hazy patchy airspace opacities seen throughout the right lung with stable airspace opacities at the left lung base. No pleural effusion. IMPRESSION: Interval slight worsening in the multifocal airspace opacities which could be due to infectious etiology, edema, or atelectasis Electronically Signed   By: Prudencio Pair M.D.   On: 02/21/2020 02:11   DG Chest Port 1 View  Result Date: 02/16/2020 CLINICAL DATA:  PICC line placement. EXAM: PORTABLE CHEST 1 VIEW COMPARISON:  Earlier film, same date. FINDINGS: The right-sided PICC line tip is in distal SVC near the cavoatrial junction. No complicating features. Stable interstitial and airspace process in  the lungs. IMPRESSION: PICC line tip is in the distal SVC near the cavoatrial junction. Electronically Signed   By: Marijo Sanes M.D.   On: 02/14/2020 12:48   [place] EKG SITE RITE  Result Date: 02/25/2020 If Site Rite image not attached, placement could not be confirmed due to current cardiac rhythm.  ASSESSMENT AND PLAN:  Eric Kennedy  is a 85 y.o. Caucasian male with a known history of type II diabetes mellitus and hypertension, CVA on aspirin and Eliquis and upper extremity DVT as well as history of GI bleeding from peptic ulcer disease presented to the emergency room with acute onset of hematemesis for 2 episodes without abdominal pain, melena or bright red bleeding per rectum.  The patient was having fever   GI bleeding with  hematemesis. - will follow serial hemoglobins and hematocrits. - continued on IV Protonix drip. -hold off his aspirin and Eliquis. -G.I. consultation with Dr. Marius Ditch. -- Recent EGD in November 2021 showed gastric ulcer without bleeding and gastritis -- patient was continued on PPI and eliquis was started after patient was diagnosed with right MCA stroke during last hospitalization.   Aspiration pneumonia possibly secondary to his vomiting, subsequent sepsis with severe sepsis-- present on admission   Sepsis manifested by tachycardia tachypnea and significant leukocytosis and lactic acid more than 2.0 and acute renal failure - on IV Unasyn and Zithromax at this time. -Mucolytic therapy will be provided. -O2 protocol will be followed. -- Speech therapy to reassess. Patient likely is aspirating in the setting of recent stroke with dysarthria and dysphagia. -- Patient seems to be at a high risk for aspiration.    Mild acute kidney injury. -Received IV fluids IV normal saline. -- Came in with creatinine of 1.31--- down to 1.1    Essential hypertension. - continue Norvasc and Lopressor  -- hold ARB given mild acute kidney injury.  Dyslipidemia. -continue statin therapy.  GERD with peptic ulcer disease.  on IV PPI therapy as mentioned above.  7.  Hypothyroidism. -continue Synthroid.  8.  DVT prophylaxis. -SCDs. -Medical prophylaxis is currently contraindicated due to GI bleeding.  Nutrition Status:  appears fraile, underweight Await dietitian recommendation after seen by SLP      patient has multiple medical problems and comorbidities. Palliative care re-consulted to discuss goals of care with patient and family. He remains DNR DNI.    Procedures:none Family communication : Consults : G.I. CODE STATUS: DNR DNI prior to admission DVT Prophylaxis : SCD  Status is: Inpatient  Remains inpatient appropriate because:IV treatments appropriate due to intensity of illness  or inability to take PO   Dispo: The patient is from: SNF              Anticipated d/c is to: SNF              Anticipated d/c date is: 3 days              Patient currently is not medically stable to d/c.        TOTAL TIME TAKING CARE OF THIS PATIENT: 25 minutes.  >50% time spent on counselling and coordination of care  Note: This dictation was prepared with Dragon dictation along with smaller phrase technology. Any transcriptional errors that result from this process are unintentional.  Fritzi Mandes M.D    [facility]   CC: Primary care physician; [name], MDPatient ID: Eric Kennedy, male   DOB: Nov 28, 1934, 85 y.o.   MRN: [id number]

## 2020-02-12 NOTE — ED Notes (Addendum)
Male purewick placed on pt and connected to suction. Pillow between legs to prevent skin breakdown.

## 2020-02-13 ENCOUNTER — Other Ambulatory Visit: Payer: Self-pay

## 2020-02-13 ENCOUNTER — Encounter: Payer: Self-pay | Admitting: Internal Medicine

## 2020-02-13 DIAGNOSIS — K922 Gastrointestinal hemorrhage, unspecified: Secondary | ICD-10-CM

## 2020-02-13 LAB — GLUCOSE, CAPILLARY
Glucose-Capillary: 116 mg/dL — ABNORMAL HIGH (ref 70–99)
Glucose-Capillary: 105 mg/dL — ABNORMAL HIGH (ref 70–99)

## 2020-02-13 LAB — HEMOGLOBIN AND HEMATOCRIT, BLOOD
HCT: 31.3 % — ABNORMAL LOW (ref 39.0–52.0)
Hemoglobin: 9.1 g/dL — ABNORMAL LOW (ref 13.0–17.0)

## 2020-02-13 LAB — LEGIONELLA PNEUMOPHILA SEROGP 1 UR AG: L. pneumophila Serogp 1 Ur Ag: NEGATIVE

## 2020-02-13 MED ORDER — HYDRALAZINE HCL 20 MG/ML IJ SOLN: 10.0000 mg | Freq: Four times a day (QID) | INTRAMUSCULAR | Status: DC | PRN

## 2020-02-13 MED ORDER — GLYCOPYRROLATE 0.2 MG/ML IJ SOLN
0.2000 mg | INTRAMUSCULAR | Status: DC | PRN
  Filled 2020-02-13: qty 1

## 2020-02-13 MED ORDER — MORPHINE SULFATE (PF) 2 MG/ML IV SOLN
1.0000 mg | INTRAVENOUS | Status: DC | PRN
  Administered 2020-02-15 (×2): 1 mg via INTRAVENOUS
  Administered 2020-02-16: 2 mg via INTRAVENOUS
  Filled 2020-02-13 (×3): qty 1

## 2020-02-13 MED ORDER — MORPHINE SULFATE (CONCENTRATE) 10 MG/0.5ML PO SOLN
5.0000 mg | ORAL | Status: DC | PRN
  Administered 2020-02-14: 10 mg via SUBLINGUAL
  Administered 2020-02-14: 5 mg via SUBLINGUAL
  Filled 2020-02-13 (×2): qty 0.5

## 2020-02-13 MED ORDER — HYDRALAZINE HCL 20 MG/ML IJ SOLN
10.0000 mg | INTRAMUSCULAR | Status: DC | PRN
  Administered 2020-02-13: 10 mg via INTRAVENOUS
  Filled 2020-02-13: qty 1

## 2020-02-13 MED ORDER — CHLORHEXIDINE GLUCONATE CLOTH 2 % EX PADS
6.0000 | MEDICATED_PAD | Freq: Every day | CUTANEOUS | Status: DC
  Administered 2020-02-13: 6 via TOPICAL

## 2020-02-13 MED ORDER — LORAZEPAM 2 MG/ML IJ SOLN: 0.5000 mg | INTRAMUSCULAR | Status: DC | PRN

## 2020-02-13 MED ORDER — BISACODYL 10 MG RE SUPP: 10.0000 mg | Freq: Every day | RECTAL | Status: DC | PRN

## 2020-02-13 NOTE — Progress Notes (Signed)
Patient ID: Eric Kennedy, male   DOB: 04-Dec-1934, 85 y.o.   MRN: [id number]  Spoke with patient's son Eric Kennedy over the phone. He has had a chance to speak with [facility] palliative care nurse practitioner and speech therapist. Eric Kennedy understands patient's prognosis is poor given multiple medical issues and recent hospitalization with acute stroke acute G.I. bleed and respiratory failure. Patient is at a high risk for aspiration per speech evaluation. Currently he is NPO. Overall long term prognosis was poor. Eric Kennedy understands and is requesting comfort measures. Life expectancy likely less than two weeks.  Pt is DNR/DNI. TOC for hospice referral

## 2020-02-13 NOTE — Clinical Social Work Note (Signed)
Per MD, plan for hospice facility. CSW called son to confirm. He was on the phone with his mother so he will call CSW back.  Eric Kennedy, [place]

## 2020-02-13 NOTE — Progress Notes (Incomplete)
[place] [facility]) Hospital Liaison RN note:

## 2020-02-13 NOTE — Evaluation (Signed)
Clinical/Bedside Swallow Evaluation Patient Details  Name: Eric Kennedy MRN: [id number] Date of Birth: [birth year]  Today's Date: 02/13/2020 Time: SLP Start Time (ACUTE ONLY): 0900 SLP Stop Time (ACUTE ONLY): 0915 SLP Time Calculation (min) (ACUTE ONLY): 15 min  Past Medical History:  Past Medical History:  Diagnosis Date  . Diabetes mellitus without complication ([place])   . Hypertension    Past Surgical History:  Past Surgical History:  Procedure Laterality Date  . CARDIAC SURGERY     triple bypass  . ESOPHAGOGASTRODUODENOSCOPY N/A 01/28/2020   Procedure: ESOPHAGOGASTRODUODENOSCOPY (EGD);  Surgeon: [name], [name], MD;  Location: [facility] ENDOSCOPY;  Service: Gastroenterology;  Laterality: N/A;   HPI:  85 y.o. male  with past medical history of HTN, DM type 2, DVT on Eliquis, recent CVA November 2021, GIB from peptic ulcer disease, recent hospitalization requiring ICU and intubation for hemorrhagic shock from peptic ulcer disease following acute stroke admitted on 03/07/2020 with vomiting and coffee ground emesis. Hospital admission for GI bleeding with hematemesis and aspiration pneumonia. Recent EGD revealed gastric ulcer without bleeding and gastritis. GI evaluation pending. Palliative medicine consultation for goals of care.    Assessment / Plan / Recommendation Clinical Impression  Pt presents with significant risks of aspiration d/t lethargy, chronic oropharyngeal dysphagia, and current medical illness. During this bedside assessment, pt was not able to sustain attention to presence of ice to lips and was only able to sustain alertness for ~ 3 seconds despite multi-modal cues from this Probation officer. Give pt's current ability, recommend NPO with ice chips by nursing only when pt is fully alert. this Probation officer spoke with pt's son, Eric Kennedy, via phone to discuss current findings. 30 minutes unbillable time spent.  SLP Visit Diagnosis: Dysphagia, oropharyngeal phase (R13.12)    Aspiration Risk  Severe  aspiration risk;Risk for inadequate nutrition/hydration    Diet Recommendation NPO;Ice chips PRN after oral care   Liquid Administration via: Spoon Medication Administration: Via alternative means    Other  Recommendations     Follow up Recommendations  (Palliative Care/Hospice referral)      Frequency and Duration   N/A         Prognosis Prognosis for Safe Diet Advancement: Guarded Barriers to Reach Goals: Cognitive deficits;Severity of deficits      Swallow Study   General Date of Onset: 02/14/2020 HPI: 85 y.o. male  with past medical history of HTN, DM type 2, DVT on Eliquis, recent CVA November 2021, GIB from peptic ulcer disease, recent hospitalization requiring ICU and intubation for hemorrhagic shock from peptic ulcer disease following acute stroke admitted on 02/22/2020 with vomiting and coffee ground emesis. Hospital admission for GI bleeding with hematemesis and aspiration pneumonia. Recent EGD revealed gastric ulcer without bleeding and gastritis. GI evaluation pending. Palliative medicine consultation for goals of care.  Type of Study: Bedside Swallow Evaluation Previous Swallow Assessment: previous with discharge on puree and thin liquids Diet Prior to this Study: NPO Temperature Spikes Noted: Yes Respiratory Status: Room air History of Recent Intubation: No Behavior/Cognition: Lethargic/Drowsy Oral Care Completed by SLP: Recent completion by staff Oral Cavity - Dentition: Edentulous Patient Positioning: Upright in bed Baseline Vocal Quality: Not observed Volitional Cough: Cognitively unable to elicit Volitional Swallow: Unable to elicit    Oral/Motor/Sensory Function Overall Oral Motor/Sensory Function: Generalized oral weakness   Ice Chips Ice chips: Impaired Presentation: Spoon Oral Phase Impairments: Poor awareness of bolus Pharyngeal Phase Impairments: Suspected delayed Swallow   Thin Liquid Thin Liquid: Not tested    Nectar Thick  Nectar Thick Liquid: Not  tested   Honey Thick Honey Thick Liquid: Not tested   Puree Puree: Not tested   Solid     Solid: Not tested     [place] Pilar B. Rutherford Nail M.S., CCC-SLP, [place] Pathologist Rehabilitation Services Office [phone]  Keshun Berrett 02/13/2020,3:53 PM

## 2020-02-13 NOTE — Progress Notes (Signed)
Daily Progress Note   Patient Name: Eric Kennedy       Date: 02/13/2020 DOB: 07/03/1934  Age: 85 y.o. MRN#: [id number] Attending Physician: Fritzi Mandes, MD Primary Care Physician: [name], MD Admit Date: 02/16/2020  Reason for Consultation/Follow-up: Establishing goals of care  Subjective: Patient lethargic. Will wake to voice and answer simple questions but does not engage in discussion. Does not appear to be in pain or distress.   GOC:  Discussed with Dr. Posey Pronto, RN, and Happi SLP. Appreciate SLP evaluation, recommendations, and contact with son Eric Kennedy).  F/u phone call to son/POA Eric Kennedy). Reviewed diagnoses, interventions, plan of care. Reiterated SLP recommendations. Discussed recommendations against placing feeding tube, explaining complications and unlikelihood this will impact Eric Kennedy quality of life following acute stroke and acute illness. Frankly and compassionately discussed poor prognosis and recommendation for comfort focused care plan and initiation of hospice services.   Reviewed patient's documented desire for natural death uploaded into EMR. Patient has clearly documented his wishes against life-prolonging measures if terminally ill, including artificial nutrition/hydration.   Discussed hospice philosophy and options, emphasizing focus on comfort, quality of life, symptom management, and dignity as he nears EOL. Educated on comfort feeds, understanding high risk for aspiration.  Eric Kennedy understands his father's condition and prognosis. He is considering comfort measures and transition to hospice facility but would like to talk to Dr. Posey Pronto prior to making final decision. Reassured Eric Kennedy that Dr. Posey Pronto will be calling him this afternoon.   Answered questions. PMT contact  information given.   **ADDENDUM: Update from Dr. Posey Pronto. She has spoken with Eric Kennedy. Plan is for transition to comfort measures and hospice facility placement. Eric Kennedy is updating the patient's wife. Comfort orders placed and TOC team notified.     Length of Stay: 1  Current Medications: Scheduled Meds:  . amLODipine  10 mg Oral Daily  . Chlorhexidine Gluconate Cloth  6 each Topical Daily  . Chlorhexidine Gluconate Cloth  6 each Topical Q0600  . insulin aspart  0-9 Units Subcutaneous TID WC  . levothyroxine  50 mcg Oral Q0600  . metoprolol tartrate  50 mg Oral BID  . multivitamin with minerals  1 tablet Oral Daily  . rosuvastatin  40 mg Oral Daily  . sodium chloride flush  10-40 mL Intracatheter Q12H    Continuous  Infusions: . sodium chloride 75 mL/hr at 02/13/20 0327  . ampicillin-sulbactam (UNASYN) IV 3 g (02/13/20 0908)  . pantoprozole (PROTONIX) infusion 8 mg/hr (02/13/20 0732)    PRN Meds: acetaminophen **OR** acetaminophen, ondansetron **OR** ondansetron (ZOFRAN) IV, sodium chloride flush, traZODone  Physical Exam Vitals and nursing note reviewed.  Constitutional:      Appearance: He is ill-appearing.  HENT:     Head: Normocephalic and atraumatic.  Cardiovascular:     Heart sounds: Normal heart sounds.  Pulmonary:     Effort: No tachypnea, accessory muscle usage or respiratory distress.     Breath sounds: Decreased breath sounds present.  Skin:    General: Skin is warm and dry.     Coloration: Skin is pale.  Neurological:     Mental Status: He is lethargic.  Psychiatric:        Attention and Perception: He is inattentive.        Speech: Speech is delayed.            Vital Signs: BP (!) 152/66 (BP Location: Left Arm)   Pulse 98   Temp 99.3 F (37.4 C)   Resp 20   Ht 5\' 10"  (1.778 m)   Wt 74 kg   SpO2 95%   BMI 23.41 kg/m  SpO2: SpO2: 95 % O2 Device: O2 Device: Room Air O2 Flow Rate: O2 Flow Rate (L/min): 2 L/min  Intake/output summary:   Intake/Output  Summary (Last 24 hours) at 02/13/2020 1128 Last data filed at 02/13/2020 0327 Gross per 24 hour  Intake 712.64 ml  Output --  Net 712.64 ml   LBM:   Baseline Weight: Weight: 74 kg Most recent weight: Weight: 74 kg       Palliative Assessment/Data: PPS 20%     Patient Active Problem List   Diagnosis Date Noted  . Aspiration pneumonia ([place]) 03/08/2020  . Pneumonia 03/02/2020  . Sepsis with encephalopathy without septic shock ([place])   . Dysphagia   . Malnutrition of moderate degree 02/06/2020  . Cerebral ischemic stroke due to global hypoperfusion with watershed infarct [facility])   . Failure to thrive (child)   . Goals of care, counseling/discussion   . Palliative care by specialist   . DNR (do not resuscitate)   . Acute hypoxemic respiratory failure ([place]) 01/30/2020  . Acute blood loss anemia 01/30/2020  . Acute right MCA stroke ([place]) 01/30/2020  . Hematemesis with nausea 01/26/2020  . Complete heart block ([place]) 06/16/2018  . Healthcare maintenance 06/06/2018  . Controlled type 2 diabetes mellitus with complication, without long-term current use of insulin ([place]) 12/02/2017  . Hypertension, well controlled 12/02/2017  . Chronic anemia 06/01/2017  . 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 Vitamin B 12 deficiency 11/26/2015  . Pure hypercholesterolemia 08/27/2015  . Severe aortic valve stenosis 06/18/2015  . Acquired hypothyroidism 02/14/2014  . Adhesive capsulitis of left shoulder 08/25/2013  . 3-vessel CAD 05/16/2013  . Hx of CABG 05/16/2013  . Carotid stenosis 05/16/2013  . Paroxysmal atrial fibrillation (HCC) 05/16/2013    Palliative Care Assessment & Plan   Patient Profile: 85 y.o. male  with past medical history of HTN, DM type 2, DVT on Eliquis, recent CVA November 2021, GIB from peptic ulcer disease, recent hospitalization requiring ICU and intubation for hemorrhagic shock from peptic ulcer disease following acute stroke admitted on 02/09/2020 with vomiting and coffee  ground emesis. Hospital admission for GI bleeding with hematemesis and aspiration pneumonia. Recent EGD revealed gastric ulcer without bleeding and gastritis.  GI evaluation pending. Palliative medicine consultation for goals of care.   Assessment: GI bleeding with hematemesis Aspiration pneumonia Severe sepsis High risk for aspiration AKI Recent CVA Recent hemorrhagic shock secondary to peptic ulcer disease  Recommendations/Plan:  Appreciate team collaboration today for [place] discussions with son/POA, Eric Kennedy.   Following conversations, son understands his father's condition and prognosis. Son agreeable with transition to comfort measures only.   Discontinued interventions not aimed at comfort.   Symptom management medications added to [facility].  Comfort sips/bites per patient/family request. Son understands aspiration risk. Aspiration precautions.   Unrestricted visitor access.  Spiritual care visit.   TOC team for residential hospice placement.   Continue frequent oral care and repositioning.    Code Status: DNR/DNI   Code Status Orders  (From admission, onward)         Start     Ordered   02/21/2020 0456  Do not attempt resuscitation (DNR)  Continuous       Question Answer Comment  In the event of cardiac or respiratory ARREST Do not call a "code blue"   In the event of cardiac or respiratory ARREST Do not perform Intubation, CPR, defibrillation or ACLS   In the event of cardiac or respiratory ARREST Use medication by any route, position, wound care, and other measures to relive pain and suffering. May use oxygen, suction and manual treatment of airway obstruction as needed for comfort.   Comments spoke with son and wife witnessed by RN sandra      02/22/2020 0456        Code Status History    Date Active Date Inactive Code Status Order ID Comments User Context   01/27/2020 1533 02/07/2020 2133 DNR [id number]  Ottie Glazier, MD Inpatient   01/26/2020 1738 01/27/2020 1533  Full Code [id number]  Ottie Glazier, MD ED   Advance Care Planning Activity       Prognosis:  <2 weeks  Discharge Planning:  Hospice facility  Care plan was discussed with Dr. Posey Pronto, SLP, RN, son, hospice liaison   Thank you for allowing the Palliative Medicine Team to assist in the care of this patient.   Total Time 40 Prolonged Time Billed  no      Greater than 50%  of this time was spent counseling and coordinating care related to the above assessment and plan.  [name], DNP, FNP-C Palliative Medicine Team  Phone: [phone] Fax: [phone]  Please contact Palliative Medicine Team phone at [phone] for questions and concerns.

## 2020-02-13 NOTE — Progress Notes (Addendum)
[facility] [place] [facility]) Hospital Liaison RN note:  Received request from [name], NP for family interest in [place]. Chart reviewed and eligibility has been approved. Spoke with son, [name], over the phone to confirm interests, initiate education related to hospice philosophy and explain services.   Unfortunately[facility] not able to offer a bed today. [name] and hospital care team are aware. Registration paperwork will be completed by [name] via docusign when room becomes available. [place] Liaison will follow for room availability.  Please call with any hospice related questions or concerns.  Thank you for the opportunity to participate in this patient's care.  Zandra Abts, RN [facility] Liaison [phone]

## 2020-02-13 NOTE — Progress Notes (Signed)
[facility] [place] NAME: Eric Kennedy    MR#:  [id number]  [place]:  1934-05-09  SUBJECTIVE:  patient was admitted from November 19 to December 1 with multiple medical problems discharge to rehab comes in with vomiting/coffee ground/hematemesis. He is on eliquis. Patient is very lethargic during my evaluation. No family in the room. History is obtained from reviewing notes.  Currently on IV antibiotics, IV Protonix drip evaluated by speech therapy earlier. Per RN she is afraid to give any oral meds.  REVIEW OF SYSTEMS:   Review of Systems  Unable to perform ROS: Medical condition   Tolerating Diet: Tolerating PT:   DRUG ALLERGIES:  No Known Allergies  VITALS:  Blood pressure (!) 152/66, pulse 98, temperature 99.3 F (37.4 C), resp. rate 20, height 5\' 10"  (1.778 m), weight 74 kg, SpO2 95 %.  PHYSICAL EXAMINATION:   Physical Exam  GENERAL:  85 y.o.-year-old patient lying in the bed with no acute distress. Frail cachectic, chronically ill HEENT: Head atraumatic, normocephalic. Oropharynx and nasopharynx clear.  LUNGS: Normal breath sounds bilaterally, no wheezing, rales, rhonchi. No use of accessory muscles of respiration.  CARDIOVASCULAR: S1, S2 normal. No murmurs, rubs, or gallops.  ABDOMEN: Soft, nontender, nondistended. Bowel sounds present. No organomegaly or mass.  EXTREMITIES: No cyanosis, clubbing or edema b/l.   PICC+ right UE NEUROLOGIC: unable to assess today due to MS PSYCHIATRIC:  patient is lethargic SKIN: per RN  LABORATORY PANEL:  CBC Recent Labs  Lab 02/07/2020 0500 02/23/2020 0546 02/13/20 0401  WBC 16.1*  --   --   HGB 10.3*   < > 9.1*  HCT 35.2*   < > 31.3*  PLT 433*  --   --    < > = values in this interval not displayed.    Chemistries  Recent Labs  Lab 02/28/2020 0150 02/14/2020 0150 02/25/2020 0500  NA 142   < > 142  K 3.8   < > 3.6  CL 108   < > 107  CO2 24   < > 24  GLUCOSE 159*   < > 176*   BUN 19   < > 19  CREATININE 1.31*   < > 1.18  CALCIUM 9.7   < > 9.6  AST 29  --   --   ALT 21  --   --   ALKPHOS 125  --   --   BILITOT 1.8*  --   --    < > = values in this interval not displayed.   Cardiac Enzymes No results for input(s): TROPONINI in the last 168 hours. RADIOLOGY:  DG Chest 1 View  Result Date: 03/01/2020 CLINICAL DATA:  Fever EXAM: CHEST  1 VIEW COMPARISON:  January 27, 2020 FINDINGS: The heart size and mediastinal contours are unchanged with mild cardiomegaly. Aortic knob calcifications are seen. A left-sided pacemaker is seen. There is slight interval worsening in the hazy patchy airspace opacities seen throughout the right lung with stable airspace opacities at the left lung base. No pleural effusion. IMPRESSION: Interval slight worsening in the multifocal airspace opacities which could be due to infectious etiology, edema, or atelectasis Electronically Signed   By: Prudencio Pair M.D.   On: 02/16/2020 02:11   DG Chest Port 1 View  Result Date: 02/15/2020 CLINICAL DATA:  PICC line placement. EXAM: PORTABLE CHEST 1 VIEW COMPARISON:  Earlier film, same date. FINDINGS: The right-sided PICC line tip is in distal  SVC near the cavoatrial junction. No complicating features. Stable interstitial and airspace process in the lungs. IMPRESSION: PICC line tip is in the distal SVC near the cavoatrial junction. Electronically Signed   By: Marijo Sanes M.D.   On: 03/06/2020 12:48   [place] EKG SITE RITE  Result Date: 02/10/2020 If Site Rite image not attached, placement could not be confirmed due to current cardiac rhythm.  ASSESSMENT AND PLAN:  Eric Kennedy  is a 85 y.o. Caucasian male with a known history of type II diabetes mellitus and hypertension, CVA on aspirin and Eliquis and upper extremity DVT as well as history of GI bleeding from peptic ulcer disease presented to the emergency room with acute onset of hematemesis for 2 episodes without abdominal pain, melena or bright red  bleeding per rectum.  The patient was having fever   GI bleeding with hematemesis. - will follow serial hemoglobins and hematocrits. - continued on IV Protonix drip. -hold off his aspirin and Eliquis. -G.I. consultation with Dr. Marius Ditch. -- Recent EGD in November 2021 showed gastric ulcer without bleeding and gastritis -- patient was continued on PPI and eliquis was started after patient was diagnosed with right MCA stroke during last hospitalization.   Aspiration pneumonia possibly secondary to his vomiting, subsequent sepsis with severe sepsis-- present on admission   Sepsis manifested by tachycardia tachypnea and significant leukocytosis and lactic acid more than 2.0 and acute renal failure - on IV Unasyn  -O2 wean to room air  -- Speech therapy -- evaluation noted. Patient is aspirating. He is having significant issues swallowing given his recent CVA and multiple medical issues. Will keep patient NPO.  -- Patient  at a high risk for aspiration. -- Per palliative care discussion with son [name]-- no artificial feeding -- patient has overall very poor poor prognosis. Will discussed with son today    Mild acute kidney injury. -Received IV normal saline. -- Came in with creatinine of 1.31--- down to 1.1    Essential hypertension. -  Norvasc and Lopressor -- on hold due to NPO status -- hold ARB given mild acute kidney injury.  Dyslipidemia. - statin therapy-- on hold  GERD with peptic ulcer disease.  on IV PPI therapy as mentioned above.  7.  Hypothyroidism. -On Synthroid   8.  DVT prophylaxis. -SCDs. -Medical prophylaxis is currently contraindicated due to GI bleeding.  Nutrition Status:  appears fraile, underweight severely malnourished-- per my eval       patient has multiple medical problems and comorbidities.  Palliative care consulted to discuss goals of care with son [name]. He remains DNR DNI.    Procedures:none Family communication :will call son  [name] Consults : G.I. CODE STATUS: DNR DNI prior to admission DVT Prophylaxis : SCD  Status is: Inpatient  Remains inpatient appropriate because:IV treatments appropriate due to intensity of illness or inability to take PO   Dispo: The patient is from: SNF              Anticipated d/c is to: SNF              Anticipated d/c date is: 3 days              Patient currently is not medically stable to d/c.        TOTAL TIME TAKING CARE OF THIS PATIENT: 25 minutes.  >50% time spent on counselling and coordination of care  Note: This dictation was prepared with Dragon dictation along with smaller phrase technology. Any  transcriptional errors that result from this process are unintentional.  Fritzi Mandes M.D    [facility]   CC: Primary care physician; [name], MDPatient ID: Eric Kennedy, male   DOB: 05/15/1934, 85 y.o.   MRN: [id number]

## 2020-02-13 NOTE — TOC Initial Note (Signed)
Transition of Care [facility]) - Initial/Assessment Note    Patient Details  Name: Eric Kennedy MRN: [id number] Date of Birth: 31-May-1934  Transition of [facility]) CM/SW Contact:    Candie Chroman, LCSW Phone Number: 02/13/2020, 3:42 PM  Clinical Narrative: Received call back from son. He confirmed interest in hospice facility in [place]. Made referral to Flo Shanks, RN from [facility] No bed available today.                 Expected Discharge Plan: [place] Barriers to Discharge: Hospice Bed not available   Patient Goals and CMS Choice        Expected Discharge Plan and Services Expected Discharge Plan: [place] Choice: Hospice Living arrangements for the past 2 months: [place], [place]                                      Prior Living Arrangements/Services Living arrangements for the past 2 months: [place], [place] Lives with:: Spouse Patient language and need for interpreter reviewed:: Yes Do you feel safe going back to the place where you live?: Yes      Need for Family Participation in Patient Care: Yes (Comment) Care giver support system in place?: Yes (comment)   Criminal Activity/Legal Involvement Pertinent to Current Situation/Hospitalization: No - Comment as needed  Activities of Daily Living Home Assistive Devices/Equipment: None ADL Screening (condition at time of admission) Patient's cognitive ability adequate to safely complete daily activities?: No Is the patient deaf or have difficulty hearing?: No Does the patient have difficulty seeing, even when wearing glasses/contacts?: No Does the patient have difficulty concentrating, remembering, or making decisions?: No Patient able to express need for assistance with ADLs?: No Does the patient have difficulty dressing or bathing?: Yes Independently performs ADLs?: No Does the patient have  difficulty walking or climbing stairs?: Yes Weakness of Legs: Both Weakness of Arms/Hands: Both  Permission Sought/Granted Permission sought to share information with : Facility Sport and exercise psychologist, Family Supports    Share Information with NAME: Younis Mathey  Permission granted to share info w AGENCY: [place] granted to share info w Relationship: Son  Permission granted to share info w Contact Information: [phone]  Emotional Assessment Appearance:: Appears stated age Attitude/Demeanor/Rapport: Unable to Assess Affect (typically observed): Unable to Assess   Alcohol / Substance Use: Not Applicable Psych Involvement: No (comment)  Admission diagnosis:  Aspiration pneumonia ([place]) [J69.0] Pneumonia [J18.9] HCAP (healthcare-associated pneumonia) [J18.9] UGIB (upper gastrointestinal bleed) [K92.2] Status post peripherally inserted central catheter (PICC) central line placement [Z95.828] Hematemesis with nausea [K92.0] Sepsis with encephalopathy without septic shock, due to unspecified organism ([place]) [A41.9, R65.20, G93.40] Patient Active Problem List   Diagnosis Date Noted  . UGIB (upper gastrointestinal bleed)   . Aspiration pneumonia ([place]) 03/05/2020  . Pneumonia 02/15/2020  . Sepsis with encephalopathy without septic shock ([place])   . Dysphagia   . Malnutrition of moderate degree 02/06/2020  . Cerebral ischemic stroke due to global hypoperfusion with watershed infarct [facility])   . Failure to thrive (child)   . Goals of care, counseling/discussion   . Palliative care by specialist   . DNR (do not resuscitate)   . Acute hypoxemic respiratory failure ([place]) 01/30/2020  . Acute blood loss anemia 01/30/2020  . Acute right MCA stroke ([place]) 01/30/2020  .  Hematemesis with nausea 01/26/2020  . Complete heart block ([place]) 06/16/2018  . Healthcare maintenance 06/06/2018  . Controlled type 2 diabetes mellitus with complication, without long-term current  use of insulin ([place]) 12/02/2017  . Hypertension, well controlled 12/02/2017  . Chronic anemia 06/01/2017  . 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 Vitamin B 12 deficiency 11/26/2015  . Pure hypercholesterolemia 08/27/2015  . Severe aortic valve stenosis 06/18/2015  . Acquired hypothyroidism 02/14/2014  . Adhesive capsulitis of left shoulder 08/25/2013  . 3-vessel CAD 05/16/2013  . Hx of CABG 05/16/2013  . Carotid stenosis 05/16/2013  . Paroxysmal atrial fibrillation ([place]) 05/16/2013   PCP:  [name], MD Pharmacy:   [facility], [place] [address] Phone: [phone] Fax: [phone]  [place], [place] [address] [place] [address] Phone: [phone] Fax: [phone]     Social Determinants of Health (SDOH) Interventions    Readmission Risk Interventions No flowsheet data found.

## 2020-02-13 NOTE — Progress Notes (Signed)
[facility] hospital liaison note:  Patient has a pending referral for [facility] outpatient services to follow at [facility]. TOC [place] made aware. Will follow for disposition. Flo Shanks BSN, RN, [facility] [phone]

## 2020-02-13 NOTE — Progress Notes (Addendum)
Initial Nutrition Assessment  DOCUMENTATION CODES:   Non-severe (moderate) malnutrition in context of chronic illness  INTERVENTION:   RD will monitor for GOC   NUTRITION DIAGNOSIS:   Moderate Malnutrition related to chronic illness as evidenced by moderate fat depletion, moderate muscle depletion, severe muscle depletion.  GOAL:   Patient will meet greater than or equal to 90% of their needs  MONITOR:   Diet advancement, Labs, Weight trends, Skin, I & O's  REASON FOR ASSESSMENT:   Consult Assessment of nutrition requirement/status  ASSESSMENT:   85 year old male with PMHx of HTN, DM,CAD, CABG, complete heart block, A-fib, aortic stenosis, carotid artery stenosis, hypothyroidism, recent admission for acute right MCA stroke complicated by left hemiplegia, dysarthria and dysphagia, acute hypoxic respiratory failure requiring intubation 11/19-11/22, acute blood loss anemia and hemorrhagic shock due to upper GI bleed s/p EGD 11/21 that showed gastric ulcer without bleeding and gastritis, acute left upper extremity DVT, AKI on CKD stage III, adult FTT. Pt now re-admitted with hematemesis and aspiration PNA  Visited pt's room today. Pt lethargic and unable to provide any nutrition related history. Pt is able to shake is head "no" that he is not hungry. Pt is familiar to nutrition department from a recent previous admit. Pt with variable oral intake during his last admit; pt  eating anywhere from 0-100% of meals. Pt does enjoy drinking Ensure and eating Magic Cups. Pt currently NPO per SLP recommendations as pt is aspirating. Family does not wish to have a feeding tube placed. Pt's weight is stable from his last admit. GOC pending    Addendum 2:49pm: pt now transitioning to comfort care  Medications reviewed and include: insulin, NaCl @75ml /hr, unasyn, protonix   Labs reviewed: Hgb 9.1(L), Hct 31.3(L)  NUTRITION - FOCUSED PHYSICAL EXAM:    Most Recent Value  Orbital Region Severe  depletion  Upper Arm Region Moderate depletion  Thoracic and Lumbar Region No depletion  Buccal Region No depletion  Temple Region Severe depletion  Clavicle Bone Region Moderate depletion  Clavicle and Acromion Bone Region Moderate depletion  Scapular Bone Region Unable to assess  Dorsal Hand Moderate depletion  Patellar Region Moderate depletion  Anterior Thigh Region Moderate depletion  Posterior Calf Region Moderate depletion  Edema (RD Assessment) Moderate  Hair Reviewed  Eyes Reviewed  Mouth Reviewed  Skin Reviewed  Nails Reviewed     Diet Order:   Diet Order            Diet NPO time specified  Diet effective now                EDUCATION NEEDS:   No education needs have been identified at this time  Skin:  Skin Assessment: Reviewed RN Assessment (ecchymosis)  Last BM:  pta  Height:   Ht Readings from Last 1 Encounters:  02/23/2020 5\' 10"  (1.778 m)    Weight:   Wt Readings from Last 1 Encounters:  02/13/20 74.1 kg    Ideal Body Weight:  75.4 kg  BMI:  Body mass index is 23.44 kg/m.  Estimated Nutritional Needs:   Kcal:  1900-2200kcal/day  Protein:  95-110g/day  Fluid:  >1.9L/day  Koleen Distance MS, RD, LDN Please refer to [facility] for RD and/or RD on-call/weekend/after hours pager

## 2020-02-14 DIAGNOSIS — E440 Moderate protein-calorie malnutrition: Secondary | ICD-10-CM

## 2020-02-14 DIAGNOSIS — Z8673 Personal history of transient ischemic attack (TIA), and cerebral infarction without residual deficits: Secondary | ICD-10-CM

## 2020-02-14 NOTE — Progress Notes (Signed)
[facility]) Hospital Liaison RN note:  Visited with patient in room. He was resting in bed. Spouse and brother and sister in law at bedside. Spoke with son, [name] over the phone to update him and let him know that unfortunately, [facility] not [facility] a bed to offer today. Hospital care team is aware. [place] Liaison will continue to follow for room availability.   Please call with any hospice related questions or concerns.  Thank you for the opportunity to participate in this patient's care.  Zandra Abts, RN [facility] Liaison [phone]

## 2020-02-14 NOTE — Progress Notes (Signed)
Patient ID: Eric Kennedy, male   DOB: 04-22-1934, 85 y.o.   MRN: [id number] [facility] PROGRESS NOTE  Eric Kennedy NWG:[id number] DOB: 1934/10/22 DOA: 02/27/2020 PCP: [name], MD  HPI/Subjective: Patient was able to answer some yes or no questions.  Unable to elaborate much.  Denied any pain.  Objective: Vitals:   02/13/20 1142 02/13/20 1954  BP: (!) 170/66 (!) 144/57  Pulse: 92 96  Resp: 18 16  Temp: 98.1 F (36.7 C) 98.3 F (36.8 C)  SpO2: 100% 94%   No intake or output data in the 24 hours ending 02/14/20 0807 Filed Weights   03/02/2020 0131 02/13/20 1433  Weight: 74 kg 74.1 kg    ROS: Review of Systems  Respiratory: Negative for shortness of breath.   Cardiovascular: Negative for chest pain.  Gastrointestinal: Negative for abdominal pain.   Exam: Physical Exam HENT:     Head: Normocephalic.     Mouth/Throat:     Pharynx: No oropharyngeal exudate.  Eyes:     General: Lids are normal.     Conjunctiva/sclera: Conjunctivae normal.     Pupils: Pupils are equal, round, and reactive to light.  Cardiovascular:     Rate and Rhythm: Normal rate and regular rhythm.     Heart sounds: Normal heart sounds, S1 normal and S2 normal.  Pulmonary:     Breath sounds: Normal breath sounds. No decreased breath sounds, wheezing, rhonchi or rales.  Abdominal:     General: Abdomen is flat.     Palpations: Abdomen is soft.     Tenderness: There is no abdominal tenderness.  Musculoskeletal:     Right lower leg: No swelling.     Left lower leg: No swelling.  Skin:    General: Skin is warm.     Findings: No rash.  Neurological:     Mental Status: He is alert.     Comments: Answered a few yes and no questions but does not elaborate.       Data Reviewed: Basic Metabolic Panel: Recent Labs  Lab 02/22/2020 0150 02/11/2020 0500  NA 142 142  K 3.8 3.6  CL 108 107  CO2 24 24  GLUCOSE 159* 176*  BUN 19 19  CREATININE 1.31* 1.18  CALCIUM 9.7 9.6   Liver Function  Tests: Recent Labs  Lab 03/02/2020 0150  AST 29  ALT 21  ALKPHOS 125  BILITOT 1.8*  PROT 6.7  ALBUMIN 3.0*   CBC: Recent Labs  Lab 03/01/2020 0150 02/14/2020 0150 02/28/2020 0500 03/02/2020 0546 02/23/2020 1254 02/15/2020 1928 02/13/20 0401  WBC 18.8*  --  16.1*  --   --   --   --   NEUTROABS 16.7*  --   --   --   --   --   --   HGB 10.8*   < > 10.3* 10.6* 9.7* 8.2* 9.1*  HCT 37.5*   < > 35.2* 35.3* 32.4* 27.9* 31.3*  MCV 82.1  --  81.9  --   --   --   --   PLT 527*  --  433*  --   --   --   --    < > = values in this interval not displayed.    CBG: Recent Labs  Lab 02/29/2020 1309 02/20/2020 1720 02/10/2020 2210 02/13/20 0737 02/13/20 1144  GLUCAP 122* 115* 123* 116* 105*    Recent Results (from the past 240 hour(s))  SARS Coronavirus 2 by RT PCR (hospital order, performed in [facility] hospital lab) Nasopharyngeal Nasopharyngeal Swab     Status: None   Collection Time: 02/07/20 11:55 AM   Specimen: Nasopharyngeal Swab  Result Value Ref Range Status   SARS Coronavirus 2 NEGATIVE NEGATIVE Final    Comment: (NOTE) SARS-CoV-2 target nucleic acids are NOT DETECTED.  The SARS-CoV-2 RNA is generally detectable in upper and lower respiratory specimens during the acute phase of infection. The lowest concentration of SARS-CoV-2 viral copies this assay can detect is 250 copies / mL. A negative result does not preclude SARS-CoV-2 infection and should not be used as the sole basis for treatment or other patient management decisions.  A negative result may occur with improper specimen collection / handling, submission of specimen other than nasopharyngeal swab, presence of viral mutation(s) within the areas targeted by this assay, and inadequate number of viral copies (<250 copies / mL). A negative result must be combined with clinical observations, patient history, and epidemiological information.  Fact Sheet for Patients:   StrictlyIdeas.no  Fact Sheet for  Healthcare Providers: BankingDealers.co.za  This test is not yet approved or  cleared by the [place] FDA and has been authorized for detection and/or diagnosis of SARS-CoV-2 by FDA under an Emergency Use Authorization (EUA).  This EUA will remain in effect (meaning this test can be used) for the duration of the COVID-19 declaration under Section 564(b)(1) of the Act, 21 U.S.C. section 360bbb-3(b)(1), unless the authorization is terminated or revoked sooner.  Performed at [facility], [address] [place], [place] [address]   Culture, blood (Routine x 2)     Status: None (Preliminary result)   Collection Time: 02/16/2020  1:50 AM   Specimen: BLOOD  Result Value Ref Range Status   Specimen Description BLOOD BLOOD RIGHT ARM  Final   Special Requests   Final    BOTTLES DRAWN AEROBIC AND ANAEROBIC Blood Culture adequate volume   Culture   Final    NO GROWTH 2 DAYS Performed at [facility], [address] [place], [place] [address]    Report Status PENDING  Incomplete  Resp Panel by RT-PCR (Flu A&B, Covid) Nasopharyngeal Swab     Status: None   Collection Time: 02/07/2020  1:50 AM   Specimen: Nasopharyngeal Swab; Nasopharyngeal(NP) swabs in vial transport medium  Result Value Ref Range Status   SARS Coronavirus 2 by RT PCR NEGATIVE NEGATIVE Final    Comment: (NOTE) SARS-CoV-2 target nucleic acids are NOT DETECTED.  The SARS-CoV-2 RNA is generally detectable in upper respiratory specimens during the acute phase of infection. The lowest concentration of SARS-CoV-2 viral copies this assay can detect is 138 copies/mL. A negative result does not preclude SARS-Cov-2 infection and should not be used as the sole basis for treatment or other patient management decisions. A negative result may occur with  improper specimen collection/handling, submission of specimen other than nasopharyngeal swab, presence of viral mutation(s) within  the areas targeted by this assay, and inadequate number of viral copies(<138 copies/mL). A negative result must be combined with clinical observations, patient history, and epidemiological information. The expected result is Negative.  Fact Sheet for Patients:  EntrepreneurPulse.com.au  Fact Sheet for Healthcare Providers:  IncredibleEmployment.be  This test is no t yet approved or cleared by the [place] FDA and  has been authorized for detection and/or diagnosis of SARS-CoV-2 by FDA under an Emergency Use Authorization (EUA). This EUA will remain  in effect (meaning this test can be used) for the duration of the COVID-19  declaration under Section 564(b)(1) of the Act, 21 U.S.C.section 360bbb-3(b)(1), unless the authorization is terminated  or revoked sooner.       Influenza A by PCR NEGATIVE NEGATIVE Final   Influenza B by PCR NEGATIVE NEGATIVE Final    Comment: (NOTE) The Xpert Xpress SARS-CoV-2/FLU/RSV plus assay is intended as an aid in the diagnosis of influenza from Nasopharyngeal swab specimens and should not be used as a sole basis for treatment. Nasal washings and aspirates are unacceptable for Xpert Xpress SARS-CoV-2/FLU/RSV testing.  Fact Sheet for Patients: EntrepreneurPulse.com.au  Fact Sheet for Healthcare Providers: IncredibleEmployment.be  This test is not yet approved or cleared by the [place] FDA and has been authorized for detection and/or diagnosis of SARS-CoV-2 by FDA under an Emergency Use Authorization (EUA). This EUA will remain in effect (meaning this test can be used) for the duration of the COVID-19 declaration under Section 564(b)(1) of the Act, 21 U.S.C. section 360bbb-3(b)(1), unless the authorization is terminated or revoked.  Performed at [facility], [address] [place], [place] [address]   Culture, blood (Routine X 2) w Reflex to ID Panel      Status: None (Preliminary result)   Collection Time: 02/13/20 11:28 AM   Specimen: BLOOD  Result Value Ref Range Status   Specimen Description BLOOD LEFT HAND  Final   Special Requests   Final    BOTTLES DRAWN AEROBIC ONLY Blood Culture results may not be optimal due to an inadequate volume of blood received in culture bottles   Culture   Final    NO GROWTH < 24 HOURS Performed at [facility], [address], [place], [place] [address]    Report Status PENDING  Incomplete     Studies: DG Chest Port 1 View  Result Date: 02/18/2020 CLINICAL DATA:  PICC line placement. EXAM: PORTABLE CHEST 1 VIEW COMPARISON:  Earlier film, same date. FINDINGS: The right-sided PICC line tip is in distal SVC near the cavoatrial junction. No complicating features. Stable interstitial and airspace process in the lungs. IMPRESSION: PICC line tip is in the distal SVC near the cavoatrial junction. Electronically Signed   By: Marijo Sanes M.D.   On: 02/10/2020 12:48    Scheduled Meds: . sodium chloride flush  10-40 mL Intracatheter Q12H   Assessment/Plan:  1. End-of-life care.  Comfort care measures awaiting hospice home bed  2. severe sepsis present on admission with aspiration pneumonia and acute kidney injury 3. Recent stroke 4. Recent GI bleed with hematemesis 5. Acute kidney injury 6. Essential hypertension 7. Hypothyroidism unspecified 8. Moderate protein calorie malnutrition 9. Patient failed swallow evaluation  Patient made comfort care measures yesterday and awaiting hospice home bed.  As needed medications for comfort.    Code Status:     Code Status Orders  (From admission, onward)         Start     Ordered   03/01/2020 0456  Do not attempt resuscitation (DNR)  Continuous       Question Answer Comment  In the event of cardiac or respiratory ARREST Do not call a "code blue"   In the event of cardiac or respiratory ARREST Do not perform Intubation, CPR, defibrillation or  ACLS   In the event of cardiac or respiratory ARREST Use medication by any route, position, wound care, and other measures to relive pain and suffering. May use oxygen, suction and manual treatment of airway obstruction as needed for comfort.   Comments spoke with son and  wife witnessed by RN sandra      02/09/2020 0456        Code Status History    Date Active Date Inactive Code Status Order ID Comments User Context   01/27/2020 1533 02/07/2020 2133 DNR [id number]  Ottie Glazier, MD Inpatient   01/26/2020 1738 01/27/2020 1533 Full Code [id number]  Ottie Glazier, MD ED   Advance Care Planning Activity     Family Communication: Spoke with son on the phone Disposition Plan: Status is: Inpatient  Dispo: The patient is from: Home              Anticipated d/c is to: Hospice home when bed available              Anticipated d/c date is: Once hospice home bed becomes available I will discharge to hospice home              Patient currently receiving comfort care measures here in the hospital until hospice home bed opens up  Time spent: 26 minutes  [place]

## 2020-02-15 DIAGNOSIS — Z8673 Personal history of transient ischemic attack (TIA), and cerebral infarction without residual deficits: Secondary | ICD-10-CM

## 2020-02-15 MED ORDER — MORPHINE SULFATE (CONCENTRATE) 10 MG/0.5ML PO SOLN
10.0000 mg | Freq: Four times a day (QID) | ORAL | Status: DC
  Administered 2020-02-15 – 2020-02-17 (×9): 10 mg via SUBLINGUAL
  Filled 2020-02-15 (×9): qty 0.5

## 2020-02-15 NOTE — Care Management Important Message (Signed)
Important Message  Patient Details  Name: Eric Kennedy MRN: [id number] Date of Birth: 1934-12-16   Medicare Important Message Given:  Yes     Dannette Barbara 02/15/2020, 1:16 PM

## 2020-02-15 NOTE — Progress Notes (Signed)
Daily Progress Note   Patient Name: Eric Kennedy       Date: 02/15/2020 DOB: Jan 04, 1935  Age: 85 y.o. MRN#: [id number] Attending Physician: Loletha Grayer, MD Primary Care Physician: [name], MD Admit Date: 02/10/2020  Reason for Consultation/Follow-up: Establishing goals of care  Subjective: Patient appears mildly uncomfortable with tachypnea and intermittent moaning. He is lethargic and does not open eyes to voice or follow commands.   Discussed with SW and RN. RN recently gave IV Morphine. Discussed plan to schedule Roxanol for pain/dyspnea.   No family at bedside.   Length of Stay: 3  Current Medications: Scheduled Meds:  . morphine CONCENTRATE  10 mg Sublingual Q6H  . sodium chloride flush  10-40 mL Intracatheter Q12H    Continuous Infusions:   PRN Meds: acetaminophen **OR** acetaminophen, bisacodyl, glycopyrrolate, LORazepam, morphine injection, morphine CONCENTRATE, ondansetron **OR** ondansetron (ZOFRAN) IV, sodium chloride flush  Physical Exam Vitals and nursing note reviewed.  Constitutional:      Appearance: He is ill-appearing.  HENT:     Head: Normocephalic and atraumatic.  Cardiovascular:     Heart sounds: Normal heart sounds.  Pulmonary:     Effort: Tachypnea present. No accessory muscle usage or respiratory distress.     Breath sounds: Decreased breath sounds present.     Comments: Tachypnea. RN recently gave IV morphine. Will schedule SL morphine.  Skin:    General: Skin is warm and dry.     Coloration: Skin is pale.  Neurological:     Mental Status: He is lethargic.  Psychiatric:        Attention and Perception: He is inattentive.        Speech: Speech is delayed.            Vital Signs: BP 131/62 (BP Location: Left Arm)   Pulse (!)  110   Temp 98.7 F (37.1 C) (Oral)   Resp (!) 30   Ht 5\' 10"  (1.778 m)   Wt 74.1 kg   SpO2 97%   BMI 23.44 kg/m  SpO2: SpO2: 97 % O2 Device: O2 Device: Nasal Cannula O2 Flow Rate: O2 Flow Rate (L/min): 1 L/min  Intake/output summary:   Intake/Output Summary (Last 24 hours) at 02/15/2020 1013 Last data filed at 02/15/2020 4196 Gross per 24 hour  Intake --  Output 500  ml  Net -500 ml   LBM: Last BM Date:  (12/8) Baseline Weight: Weight: 74 kg Most recent weight: Weight: 74.1 kg       Palliative Assessment/Data: PPS 20%     Patient Active Problem List   Diagnosis Date Noted  . Hx of completed stroke   . UGIB (upper gastrointestinal bleed)   . Aspiration pneumonia ([place]) 02/08/2020  . Pneumonia 02/08/2020  . Severe sepsis ([place])   . Dysphagia   . Moderate malnutrition ([place]) 02/06/2020  . Cerebral ischemic stroke due to global hypoperfusion with watershed infarct [facility])   . Failure to thrive (child)   . End of life care   . Palliative care by specialist   . DNR (do not resuscitate)   . Acute hypoxemic respiratory failure ([place]) 01/30/2020  . Acute blood loss anemia 01/30/2020  . Acute right MCA stroke ([place]) 01/30/2020  . Hematemesis with nausea 01/26/2020  . Complete heart block ([place]) 06/16/2018  . Healthcare maintenance 06/06/2018  . Controlled type 2 diabetes mellitus with complication, without long-term current use of insulin ([place]) 12/02/2017  . Hypertension, well controlled 12/02/2017  . Chronic anemia 06/01/2017  . 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 Vitamin B 12 deficiency 11/26/2015  . Pure hypercholesterolemia 08/27/2015  . Severe aortic valve stenosis 06/18/2015  . Acquired hypothyroidism 02/14/2014  . Adhesive capsulitis of left shoulder 08/25/2013  . 3-vessel CAD 05/16/2013  . Hx of CABG 05/16/2013  . Carotid stenosis 05/16/2013  . Paroxysmal atrial fibrillation (HCC) 05/16/2013    Palliative Care Assessment & Plan   Patient Profile: 85 y.o. male   with past medical history of HTN, DM type 2, DVT on Eliquis, recent CVA November 2021, GIB from peptic ulcer disease, recent hospitalization requiring ICU and intubation for hemorrhagic shock from peptic ulcer disease following acute stroke admitted on 02/10/2020 with vomiting and coffee ground emesis. Hospital admission for GI bleeding with hematemesis and aspiration pneumonia. Recent EGD revealed gastric ulcer without bleeding and gastritis. GI evaluation pending. Palliative medicine consultation for goals of care.   Assessment: GI bleeding with hematemesis Aspiration pneumonia Severe sepsis High risk for aspiration AKI Recent CVA Recent hemorrhagic shock secondary to peptic ulcer disease  Recommendations/Plan:  Appreciate team collaboration today for [place] discussions with son/POA, [name] on 02/13/20.  Following conversations, son understands his father's condition and prognosis. Son agreeable with transition to comfort measures only.   Discontinued interventions not aimed at comfort.   Symptom management medications added to [facility]. Scheduled Roxanol.   Comfort sips/bites per patient/family request. Son understands aspiration risk. Aspiration precautions.   Unrestricted visitor access.  Spiritual care visit.   TOC team for residential hospice placement. Waiting on bed availability.   Continue frequent oral care and repositioning.   Code Status: DNR/DNI   Code Status Orders  (From admission, onward)         Start     Ordered   03/02/2020 0456  Do not attempt resuscitation (DNR)  Continuous       Question Answer Comment  In the event of cardiac or respiratory ARREST Do not call a "code blue"   In the event of cardiac or respiratory ARREST Do not perform Intubation, CPR, defibrillation or ACLS   In the event of cardiac or respiratory ARREST Use medication by any route, position, wound care, and other measures to relive pain and suffering. May use oxygen, suction and manual treatment  of airway obstruction as needed for comfort.   Comments spoke with son and  wife witnessed by RN sandra      02/18/2020 0456        Code Status History    Date Active Date Inactive Code Status Order ID Comments User Context   01/27/2020 1533 02/07/2020 2133 DNR [id number]  Ottie Glazier, MD Inpatient   01/26/2020 1738 01/27/2020 1533 Full Code [id number]  Ottie Glazier, MD ED   Advance Care Planning Activity       Prognosis:  <2 weeks  Discharge Planning:  Hospice facility  Care plan was discussed with RN, SW, hospice liaison. No family at bedside.    Thank you for allowing the Palliative Medicine Team to assist in the care of this patient.   Total Time 20 Prolonged Time Billed  no    Greater than 50% of this time was spent counseling and coordinating care related to the above assessment and plan.   [name], DNP, FNP-C Palliative Medicine Team  Phone: [phone] Fax: [phone]  Please contact Palliative Medicine Team phone at [phone] for questions and concerns.

## 2020-02-15 NOTE — Progress Notes (Signed)
Patient ID: Eric Kennedy, male   DOB: 1934/03/27, 85 y.o.   MRN: [id number] [facility] PROGRESS NOTE  Eric Kennedy MGQ:[id number] DOB: Aug 14, 1934 DOA: 02/21/2020 PCP: [name], MD  HPI/Subjective: Patient awakened and answered a question of 2 but went right back to sleep.  When I asked him if he had any pain he stated "no".  Initially came in on 02/10/2020 with fever and vomiting.  Objective: Vitals:   02/15/20 1146 02/15/20 1147  BP:  (!) 112/52  Pulse:  (!) 113  Resp:  (!) 30  Temp: (!) 101.1 F (38.4 C)   SpO2:  96%    Intake/Output Summary (Last 24 hours) at 02/15/2020 1157 Last data filed at 02/15/2020 2671 Gross per 24 hour  Intake --  Output 500 ml  Net -500 ml   Filed Weights   03/07/2020 0131 02/13/20 1433  Weight: 74 kg 74.1 kg    ROS: Review of Systems  Cardiovascular: Negative for chest pain.  Gastrointestinal: Negative for abdominal pain.  Musculoskeletal: Negative for joint pain.   Exam: Physical Exam HENT:     Head: Normocephalic.     Mouth/Throat:     Pharynx: No oropharyngeal exudate.  Eyes:     General: Lids are normal.     Conjunctiva/sclera: Conjunctivae normal.     Pupils: Pupils are equal, round, and reactive to light.  Cardiovascular:     Rate and Rhythm: Normal rate and regular rhythm.     Heart sounds: Normal heart sounds, S1 normal and S2 normal.  Pulmonary:     Breath sounds: No decreased breath sounds, wheezing, rhonchi or rales.  Abdominal:     Palpations: Abdomen is soft.     Tenderness: There is no abdominal tenderness.  Musculoskeletal:     Right ankle: No swelling.     Left ankle: No swelling.  Skin:    General: Skin is warm.     Findings: No rash.  Neurological:     Mental Status: He is lethargic.       Data Reviewed: Basic Metabolic Panel: Recent Labs  Lab 02/21/2020 0150 02/13/2020 0500  NA 142 142  K 3.8 3.6  CL 108 107  CO2 24 24  GLUCOSE 159* 176*  BUN 19 19  CREATININE 1.31* 1.18  CALCIUM 9.7  9.6   Liver Function Tests: Recent Labs  Lab 02/19/2020 0150  AST 29  ALT 21  ALKPHOS 125  BILITOT 1.8*  PROT 6.7  ALBUMIN 3.0*   CBC: Recent Labs  Lab 02/22/2020 0150 02/28/2020 0500 02/28/2020 0546 03/07/2020 1254 02/13/2020 1928 02/13/20 0401  WBC 18.8* 16.1*  --   --   --   --   NEUTROABS 16.7*  --   --   --   --   --   HGB 10.8* 10.3* 10.6* 9.7* 8.2* 9.1*  HCT 37.5* 35.2* 35.3* 32.4* 27.9* 31.3*  MCV 82.1 81.9  --   --   --   --   PLT 527* 433*  --   --   --   --     CBG: Recent Labs  Lab 03/01/2020 1309 02/29/2020 1720 02/13/2020 2210 02/13/20 0737 02/13/20 1144  GLUCAP 122* 115* 123* 116* 105*    Recent Results (from the past 240 hour(s))  SARS Coronavirus 2 by RT PCR (hospital order, performed in [facility] hospital lab) Nasopharyngeal Nasopharyngeal Swab     Status: None   Collection Time: 02/07/20 11:55 AM   Specimen: Nasopharyngeal Swab  Result Value  Ref Range Status   SARS Coronavirus 2 NEGATIVE NEGATIVE Final    Comment: (NOTE) SARS-CoV-2 target nucleic acids are NOT DETECTED.  The SARS-CoV-2 RNA is generally detectable in upper and lower respiratory specimens during the acute phase of infection. The lowest concentration of SARS-CoV-2 viral copies this assay can detect is 250 copies / mL. A negative result does not preclude SARS-CoV-2 infection and should not be used as the sole basis for treatment or other patient management decisions.  A negative result may occur with improper specimen collection / handling, submission of specimen other than nasopharyngeal swab, presence of viral mutation(s) within the areas targeted by this assay, and inadequate number of viral copies (<250 copies / mL). A negative result must be combined with clinical observations, patient history, and epidemiological information.  Fact Sheet for Patients:   StrictlyIdeas.no  Fact Sheet for Healthcare  Providers: BankingDealers.co.za  This test is not yet approved or  cleared by the [place] FDA and has been authorized for detection and/or diagnosis of SARS-CoV-2 by FDA under an Emergency Use Authorization (EUA).  This EUA will remain in effect (meaning this test can be used) for the duration of the COVID-19 declaration under Section 564(b)(1) of the Act, 21 U.S.C. section 360bbb-3(b)(1), unless the authorization is terminated or revoked sooner.  Performed at [facility], [address] [place], [place] [address]   Culture, blood (Routine x 2)     Status: None (Preliminary result)   Collection Time: 02/28/2020  1:50 AM   Specimen: BLOOD  Result Value Ref Range Status   Specimen Description BLOOD BLOOD RIGHT ARM  Final   Special Requests   Final    BOTTLES DRAWN AEROBIC AND ANAEROBIC Blood Culture adequate volume   Culture   Final    NO GROWTH 3 DAYS Performed at [facility], [address] [place], [place] [address]    Report Status PENDING  Incomplete  Resp Panel by RT-PCR (Flu A&B, Covid) Nasopharyngeal Swab     Status: None   Collection Time: 02/23/2020  1:50 AM   Specimen: Nasopharyngeal Swab; Nasopharyngeal(NP) swabs in vial transport medium  Result Value Ref Range Status   SARS Coronavirus 2 by RT PCR NEGATIVE NEGATIVE Final    Comment: (NOTE) SARS-CoV-2 target nucleic acids are NOT DETECTED.  The SARS-CoV-2 RNA is generally detectable in upper respiratory specimens during the acute phase of infection. The lowest concentration of SARS-CoV-2 viral copies this assay can detect is 138 copies/mL. A negative result does not preclude SARS-Cov-2 infection and should not be used as the sole basis for treatment or other patient management decisions. A negative result may occur with  improper specimen collection/handling, submission of specimen other than nasopharyngeal swab, presence of viral mutation(s) within the areas targeted  by this assay, and inadequate number of viral copies(<138 copies/mL). A negative result must be combined with clinical observations, patient history, and epidemiological information. The expected result is Negative.  Fact Sheet for Patients:  EntrepreneurPulse.com.au  Fact Sheet for Healthcare Providers:  IncredibleEmployment.be  This test is no t yet approved or cleared by the [place] FDA and  has been authorized for detection and/or diagnosis of SARS-CoV-2 by FDA under an Emergency Use Authorization (EUA). This EUA will remain  in effect (meaning this test can be used) for the duration of the COVID-19 declaration under Section 564(b)(1) of the Act, 21 U.S.C.section 360bbb-3(b)(1), unless the authorization is terminated  or revoked sooner.       Influenza A  by PCR NEGATIVE NEGATIVE Final   Influenza B by PCR NEGATIVE NEGATIVE Final    Comment: (NOTE) The Xpert Xpress SARS-CoV-2/FLU/RSV plus assay is intended as an aid in the diagnosis of influenza from Nasopharyngeal swab specimens and should not be used as a sole basis for treatment. Nasal washings and aspirates are unacceptable for Xpert Xpress SARS-CoV-2/FLU/RSV testing.  Fact Sheet for Patients: EntrepreneurPulse.com.au  Fact Sheet for Healthcare Providers: IncredibleEmployment.be  This test is not yet approved or cleared by the [place] FDA and has been authorized for detection and/or diagnosis of SARS-CoV-2 by FDA under an Emergency Use Authorization (EUA). This EUA will remain in effect (meaning this test can be used) for the duration of the COVID-19 declaration under Section 564(b)(1) of the Act, 21 U.S.C. section 360bbb-3(b)(1), unless the authorization is terminated or revoked.  Performed at [facility], [address] [place], [place] [address]   Culture, blood (Routine X 2) w Reflex to ID Panel     Status: None  (Preliminary result)   Collection Time: 02/13/20 11:28 AM   Specimen: BLOOD  Result Value Ref Range Status   Specimen Description BLOOD LEFT HAND  Final   Special Requests   Final    BOTTLES DRAWN AEROBIC ONLY Blood Culture results may not be optimal due to an inadequate volume of blood received in culture bottles   Culture   Final    NO GROWTH 2 DAYS Performed at [facility], [address], [place], [place] [address]    Report Status PENDING  Incomplete     Scheduled Meds: . morphine CONCENTRATE  10 mg Sublingual Q6H  . sodium chloride flush  10-40 mL Intracatheter Q12H    Assessment/Plan:  1. End-of-life care.  Continue comfort care measures.  Still awaiting hospice home bed availability. 2. Severe sepsis, present on admission with aspiration pneumonia and acute metabolic encephalopathy. 3. Recent stroke.  Patient failed swallow evaluation. 4. Recent GI bleed with hematemesis 5. Initial dehydration.  Creatinine 1.31.  Improved to 1.18 with initial hydration.No further labs. 6. Moderate protein calorie malnutrition 7. Patient failed swallow evaluation.     Code Status:     Code Status Orders  (From admission, onward)         Start     Ordered   02/21/2020 0456  Do not attempt resuscitation (DNR)  Continuous       Question Answer Comment  In the event of cardiac or respiratory ARREST Do not call a "code blue"   In the event of cardiac or respiratory ARREST Do not perform Intubation, CPR, defibrillation or ACLS   In the event of cardiac or respiratory ARREST Use medication by any route, position, wound care, and other measures to relive pain and suffering. May use oxygen, suction and manual treatment of airway obstruction as needed for comfort.   Comments spoke with son and wife witnessed by RN sandra      02/23/2020 0456        Code Status History    Date Active Date Inactive Code Status Order ID Comments User Context   01/27/2020 1533 02/07/2020 2133 DNR  [id number]  Ottie Glazier, MD Inpatient   01/26/2020 1738 01/27/2020 1533 Full Code [id number]  Ottie Glazier, MD ED   Advance Care Planning Activity     Family Communication: Spoke with son on the phone Disposition Plan: Status is: Inpatient  Dispo: The patient is from: Home  Anticipated d/c is to: Hospice home              Anticipated d/c date is: Hospice home when bed available.  Still waiting for hospice home bed.              Patient currently can go to hospice home whenever bed becomes available.  Time spent: 26 minutes  [place]

## 2020-02-15 NOTE — Progress Notes (Signed)
Cooling pad placed under patients back at this time.

## 2020-02-15 NOTE — Progress Notes (Signed)
Temp 102.3 axillary. Patient was medicated with PRN Tylenol suppository earlier in shift, not currently due for repeat dose. MD notified via secure chat. Cold rag placed to patients forehead and neck for comfort.

## 2020-02-15 NOTE — Progress Notes (Signed)
[facility]) Hospital Liaison RN note:  Visited with patient in room. He seems to be resting comfortably. Spoke with son, [name] over the phone to update him and let him know that the [facility] does not have a bed to offer today. [place] Scrape, TOC is aware. [place] Liaison will continue to follow for room availability.  Please call with any hospice related questions or concerns.  Thank you for the opportunity to participate in this patient's care.  Zandra Abts, RN [facility] Liaison [phone]

## 2020-02-16 DIAGNOSIS — R52 Pain, unspecified: Secondary | ICD-10-CM

## 2020-02-16 NOTE — Progress Notes (Signed)
Daily Progress Note   Patient Name: Eric Kennedy       Date: 02/16/2020 DOB: Aug 20, 1934  Age: 85 y.o. MRN#: [id number] Attending Physician: Loletha Grayer, MD Primary Care Physician: [name], MD Admit Date: 03/05/2020  Reason for Consultation/Follow-up: Establishing goals of care  Subjective: Patient appears mildly uncomfortable with intermittent moaning. RN just gave IV morphine. Will continue scheduled Roxanol. Cooling blanket placed for fevers along with prn PR tylenol.   No family at bedside. Discussed with RN.   Length of Stay: 4  Current Medications: Scheduled Meds:  . morphine CONCENTRATE  10 mg Sublingual Q6H  . sodium chloride flush  10-40 mL Intracatheter Q12H    Continuous Infusions:   PRN Meds: acetaminophen **OR** acetaminophen, bisacodyl, glycopyrrolate, LORazepam, morphine injection, morphine CONCENTRATE, ondansetron **OR** ondansetron (ZOFRAN) IV, sodium chloride flush  Physical Exam Vitals and nursing note reviewed.  Constitutional:      Appearance: He is ill-appearing.     Comments: moaning  HENT:     Head: Normocephalic and atraumatic.  Cardiovascular:     Heart sounds: Normal heart sounds.  Pulmonary:     Effort: No tachypnea, accessory muscle usage or respiratory distress.     Breath sounds: Decreased breath sounds present.  Skin:    General: Skin is warm and dry.     Coloration: Skin is pale.  Neurological:     Mental Status: He is lethargic.     Comments: Moaning. RN gave IV morphine.  Psychiatric:        Attention and Perception: He is inattentive.        Speech: Speech is delayed.            Vital Signs: BP (!) 99/48 (BP Location: Left Arm)   Pulse 98   Temp 97.6 F (36.4 C) (Oral)   Resp (!) 22   Ht 5\' 10"  (1.778 m)   Wt  74.1 kg   SpO2 100%   BMI 23.44 kg/m  SpO2: SpO2: 100 % O2 Device: O2 Device: Nasal Cannula O2 Flow Rate: O2 Flow Rate (L/min): 1 L/min  Intake/output summary:   Intake/Output Summary (Last 24 hours) at 02/16/2020 0918 Last data filed at 02/15/2020 2256 Gross per 24 hour  Intake 10 ml  Output --  Net 10 ml   LBM: Last BM Date: 02/14/20  Baseline Weight: Weight: 74 kg Most recent weight: Weight: 74.1 kg       Palliative Assessment/Data: PPS 20%     Patient Active Problem List   Diagnosis Date Noted  . Hx of completed stroke   . UGIB (upper gastrointestinal bleed)   . Aspiration pneumonia ([place]) 02/19/2020  . Pneumonia 03/02/2020  . Severe sepsis ([place])   . Dysphagia   . Moderate malnutrition ([place]) 02/06/2020  . Cerebral ischemic stroke due to global hypoperfusion with watershed infarct [facility])   . Failure to thrive (child)   . End of life care   . Palliative care by specialist   . DNR (do not resuscitate)   . Acute hypoxemic respiratory failure ([place]) 01/30/2020  . Acute blood loss anemia 01/30/2020  . Acute right MCA stroke ([place]) 01/30/2020  . Hematemesis with nausea 01/26/2020  . Complete heart block ([place]) 06/16/2018  . Healthcare maintenance 06/06/2018  . Controlled type 2 diabetes mellitus with complication, without long-term current use of insulin ([place]) 12/02/2017  . Hypertension, well controlled 12/02/2017  . Chronic anemia 06/01/2017  . 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 Vitamin B 12 deficiency 11/26/2015  . Pure hypercholesterolemia 08/27/2015  . Severe aortic valve stenosis 06/18/2015  . Acquired hypothyroidism 02/14/2014  . Adhesive capsulitis of left shoulder 08/25/2013  . 3-vessel CAD 05/16/2013  . Hx of CABG 05/16/2013  . Carotid stenosis 05/16/2013  . Paroxysmal atrial fibrillation ([facility]) 05/16/2013    Palliative Care Assessment & Plan   Patient Profile: 85 y.o. male  with past medical history of HTN, DM type 2, DVT on Eliquis, recent CVA  November 2021, GIB from peptic ulcer disease, recent hospitalization requiring ICU and intubation for hemorrhagic shock from peptic ulcer disease following acute stroke admitted on 03/06/2020 with vomiting and coffee ground emesis. Hospital admission for GI bleeding with hematemesis and aspiration pneumonia. Recent EGD revealed gastric ulcer without bleeding and gastritis. GI evaluation pending. Palliative medicine consultation for goals of care.   Assessment: GI bleeding with hematemesis Aspiration pneumonia Severe sepsis High risk for aspiration AKI Recent CVA Recent hemorrhagic shock secondary to peptic ulcer disease  Recommendations/Plan:  Appreciate team collaboration today for [place] discussions with son/POA, [name] on 02/13/20.  Following conversations, son understands his father's condition and prognosis. Son agreeable with transition to comfort measures only.   Discontinued interventions not aimed at comfort.   Symptom management medications added to [facility]. Scheduled Roxanol.   Comfort sips/bites per patient/family request. Son understands aspiration risk. Aspiration precautions.   Unrestricted visitor access.  Spiritual care visit.   TOC team for residential hospice placement. Waiting on bed availability.   Continue frequent oral care and repositioning.   Code Status: DNR/DNI   Code Status Orders  (From admission, onward)         Start     Ordered   02/23/2020 0456  Do not attempt resuscitation (DNR)  Continuous       Question Answer Comment  In the event of cardiac or respiratory ARREST Do not call a "code blue"   In the event of cardiac or respiratory ARREST Do not perform Intubation, CPR, defibrillation or ACLS   In the event of cardiac or respiratory ARREST Use medication by any route, position, wound care, and other measures to relive pain and suffering. May use oxygen, suction and manual treatment of airway obstruction as needed for comfort.   Comments spoke with son  and wife witnessed by RN sandra      03/08/2020 [id number]  Code Status History    Date Active Date Inactive Code Status Order ID Comments User Context   01/27/2020 1533 02/07/2020 2133 DNR [id number]  Ottie Glazier, MD Inpatient   01/26/2020 1738 01/27/2020 1533 Full Code [id number]  Ottie Glazier, MD ED   Advance Care Planning Activity       Prognosis:  <2 weeks  Discharge Planning:  Hospice facility  Care plan was discussed with RN, no family at bedside. Will call son today   Thank you for allowing the Palliative Medicine Team to assist in the care of this patient.   Total Time 15 Prolonged Time Billed  no    Greater than 50% of this time was spent counseling and coordinating care related to the above assessment and plan.   [name], DNP, FNP-C Palliative Medicine Team  Phone: [phone] Fax: [phone]  Please contact Palliative Medicine Team phone at [phone] for questions and concerns.

## 2020-02-16 NOTE — Progress Notes (Addendum)
[facility] [facility]) Hospital Liaison RN note:  Visited with patient and spouse, [name] in room. Unfortunately, Hospice Home does not have a bed to offer today. Hospital care team is aware. [place] Liaison will follow for room availability. There may be a bed available tomorrow and the Hospice Home. The Hospice Home will reach out to weekend [facility] to arrange transport. Son, [name] will docusign consents.  Please call with any hospice related questions or concerns.  [facility] Liaison [phone]

## 2020-02-16 NOTE — Progress Notes (Signed)
Patient ID: Eric Kennedy, male   DOB: [birth year], 85 y.o.   MRN: [id number] [facility] PROGRESS NOTE  Inez Rosato [id number]:[id number] DOB: 07/01/1934 DOA: 03/05/2020 PCP: [name], MD  HPI/Subjective: Patient was moaning when I came into the room.  I asked him if he was in any pain and he said no.  When I palpated his abdomen he did say that it hurt.  Objective: Vitals:   02/16/20 0819 02/16/20 1116  BP: (!) 99/48 (!) 97/46  Pulse: 98 95  Resp: (!) 22 20  Temp: 97.6 F (36.4 C) 98.5 F (36.9 C)  SpO2: 100% 97%    Intake/Output Summary (Last 24 hours) at 02/16/2020 1428 Last data filed at 02/15/2020 2256 Gross per 24 hour  Intake 10 ml  Output --  Net 10 ml   Filed Weights   02/11/2020 0131 02/13/20 1433  Weight: 74 kg 74.1 kg    ROS: Review of Systems  Respiratory: Negative for shortness of breath.   Cardiovascular: Negative for chest pain.  Gastrointestinal: Positive for abdominal pain.   Exam: Physical Exam HENT:     Head: Normocephalic.     Mouth/Throat:     Pharynx: No oropharyngeal exudate.     Comments: Dry Eyes:     General: Lids are normal.     Conjunctiva/sclera: Conjunctivae normal.  Cardiovascular:     Rate and Rhythm: Normal rate and regular rhythm.     Heart sounds: Normal heart sounds, S1 normal and S2 normal.  Pulmonary:     Breath sounds: Examination of the right-lower field reveals decreased breath sounds. Examination of the left-lower field reveals decreased breath sounds. Decreased breath sounds present. No wheezing, rhonchi or rales.  Abdominal:     Palpations: Abdomen is soft.     Tenderness: There is generalized abdominal tenderness.  Musculoskeletal:     Right lower leg: No swelling.     Left lower leg: No swelling.  Skin:    General: Skin is warm.     Findings: No rash.  Neurological:     Mental Status: He is lethargic.        Scheduled Meds: . morphine CONCENTRATE  10 mg Sublingual Q6H  . sodium chloride flush   10-40 mL Intracatheter Q12H    Assessment/Plan:  1. End-of-life care.  Awaiting hospice home bed availability.  Unfortunately no hospice home bed available today.  Continue comfort care measures.  I asked the nurse to give some pain medication this morning after I palpated his abdomen. 2. Severe sepsis, present on admission with aspiration pneumonia and acute metabolic encephalopathy.  Patient also had fever last night.  Antibiotics stopped. 3. Recent stroke.  Patient failed swallow evaluation. 4. Recent GI bleed with hematemesis 5. Dehydration. 6. Moderate protein calorie malnutrition 7. Failed swallow evaluation     Code Status:     Code Status Orders  (From admission, onward)         Start     Ordered   02/21/2020 0456  Do not attempt resuscitation (DNR)  Continuous       Question Answer Comment  In the event of cardiac or respiratory ARREST Do not call a "code blue"   In the event of cardiac or respiratory ARREST Do not perform Intubation, CPR, defibrillation or ACLS   In the event of cardiac or respiratory ARREST Use medication by any route, position, wound care, and other measures to relive pain and suffering. May use oxygen, suction and manual treatment of airway obstruction  as needed for comfort.   Comments spoke with son and wife witnessed by RN sandra      02/22/2020 0456        Code Status History    Date Active Date Inactive Code Status Order ID Comments User Context   01/27/2020 1533 02/07/2020 2133 DNR [id number]  Ottie Glazier, MD Inpatient   01/26/2020 1738 01/27/2020 1533 Full Code [id number]  Ottie Glazier, MD ED   Advance Care Planning Activity     Family Communication: Spoke with wife at the bedside Disposition Plan: Status is: Inpatient  Dispo: The patient is from: Home              Anticipated d/c is to: Hospice home              Anticipated d/c date is: To be determined based on hospice home bed availability              Patient currently comfort  care measures here in the hospital  Time spent: 28 minutes  [place]

## 2020-02-17 DIAGNOSIS — R0689 Other abnormalities of breathing: Secondary | ICD-10-CM

## 2020-02-17 LAB — CULTURE, BLOOD (ROUTINE X 2)
Culture: NO GROWTH
Special Requests: ADEQUATE

## 2020-02-18 LAB — CULTURE, BLOOD (ROUTINE X 2): Culture: NO GROWTH

## 2020-03-09 NOTE — Progress Notes (Signed)
Patient ID: Eric Kennedy, male   DOB: 10-22-1934, 86 y.o.   MRN: [id number] [facility] PROGRESS NOTE  Eric Kennedy GUR:[id number] DOB: 10/04/1934 DOA: 03/02/2020 PCP: [name], MD  HPI/Subjective: Patient unresponsive.  Patient with agonal breathing with periods of apnea.  Objective: Vitals:   02/16/20 2005 03/12/2020 0759  BP: (!) 93/40 (!) 68/44  Pulse: 95 (!) 110  Resp: 16 (!) 23  Temp:  99 F (37.2 C)  SpO2: 98% 95%    Intake/Output Summary (Last 24 hours) at 03/12/2020 0858 Last data filed at Mar 12, 2020 0544 Gross per 24 hour  Intake --  Output 50 ml  Net -50 ml   Filed Weights   02/18/2020 0131 02/13/20 1433  Weight: 74 kg 74.1 kg    ROS: Review of Systems  Unable to perform ROS: Acuity of condition   Exam: Physical Exam HENT:     Head: Normocephalic.     Mouth/Throat:     Comments: Mouth dry Eyes:     General: Lids are normal.     Conjunctiva/sclera: Conjunctivae normal.     Comments: Pupils pinpoint  Cardiovascular:     Rate and Rhythm: Normal rate and regular rhythm.     Heart sounds: Normal heart sounds, S1 normal and S2 normal.  Pulmonary:     Breath sounds: Examination of the right-lower field reveals decreased breath sounds. Examination of the left-lower field reveals decreased breath sounds. Decreased breath sounds present. No wheezing, rhonchi or rales.     Comments: Agonal breathing with periods of apnea. Abdominal:     Palpations: Abdomen is soft.     Tenderness: There is no abdominal tenderness.  Musculoskeletal:     Right ankle: No swelling.     Left ankle: No swelling.  Skin:    General: Skin is warm.     Findings: No rash.  Neurological:     Mental Status: He is unresponsive.       Data Reviewed: Basic Metabolic Panel: Recent Labs  Lab 02/25/2020 0150 02/24/2020 0500  NA 142 142  K 3.8 3.6  CL 108 107  CO2 24 24  GLUCOSE 159* 176*  BUN 19 19  CREATININE 1.31* 1.18  CALCIUM 9.7 9.6   Liver Function  Tests: Recent Labs  Lab 02/10/2020 0150  AST 29  ALT 21  ALKPHOS 125  BILITOT 1.8*  PROT 6.7  ALBUMIN 3.0*   CBC: Recent Labs  Lab 02/09/2020 0150 03/05/2020 0500 03/03/2020 0546 02/09/2020 1254 02/07/2020 1928 02/13/20 0401  WBC 18.8* 16.1*  --   --   --   --   NEUTROABS 16.7*  --   --   --   --   --   HGB 10.8* 10.3* 10.6* 9.7* 8.2* 9.1*  HCT 37.5* 35.2* 35.3* 32.4* 27.9* 31.3*  MCV 82.1 81.9  --   --   --   --   PLT 527* 433*  --   --   --   --     CBG: Recent Labs  Lab 03/08/2020 1309 03/08/2020 1720 02/11/2020 2210 02/13/20 0737 02/13/20 1144  GLUCAP 122* 115* 123* 116* 105*    Recent Results (from the past 240 hour(s))  SARS Coronavirus 2 by RT PCR (hospital order, performed in [facility] hospital lab) Nasopharyngeal Nasopharyngeal Swab     Status: None   Collection Time: 02/07/20 11:55 AM   Specimen: Nasopharyngeal Swab  Result Value Ref Range Status   SARS Coronavirus 2 NEGATIVE NEGATIVE Final    Comment: (  NOTE) SARS-CoV-2 target nucleic acids are NOT DETECTED.  The SARS-CoV-2 RNA is generally detectable in upper and lower respiratory specimens during the acute phase of infection. The lowest concentration of SARS-CoV-2 viral copies this assay can detect is 250 copies / mL. A negative result does not preclude SARS-CoV-2 infection and should not be used as the sole basis for treatment or other patient management decisions.  A negative result may occur with improper specimen collection / handling, submission of specimen other than nasopharyngeal swab, presence of viral mutation(s) within the areas targeted by this assay, and inadequate number of viral copies (<250 copies / mL). A negative result must be combined with clinical observations, patient history, and epidemiological information.  Fact Sheet for Patients:   StrictlyIdeas.no  Fact Sheet for Healthcare Providers: BankingDealers.co.za  This test is not yet  approved or  cleared by the [place] FDA and has been authorized for detection and/or diagnosis of SARS-CoV-2 by FDA under an Emergency Use Authorization (EUA).  This EUA will remain in effect (meaning this test can be used) for the duration of the COVID-19 declaration under Section 564(b)(1) of the Act, 21 U.S.C. section 360bbb-3(b)(1), unless the authorization is terminated or revoked sooner.  Performed at [facility], [address] [place], [place] [address]   Culture, blood (Routine x 2)     Status: None   Collection Time: 03/08/2020  1:50 AM   Specimen: BLOOD  Result Value Ref Range Status   Specimen Description BLOOD BLOOD RIGHT ARM  Final   Special Requests   Final    BOTTLES DRAWN AEROBIC AND ANAEROBIC Blood Culture adequate volume   Culture   Final    NO GROWTH 5 DAYS Performed at [facility], [address] [place], [place] [address]    Report Status February 28, 2020 FINAL  Final  Resp Panel by RT-PCR (Flu A&B, Covid) Nasopharyngeal Swab     Status: None   Collection Time: 02/27/2020  1:50 AM   Specimen: Nasopharyngeal Swab; Nasopharyngeal(NP) swabs in vial transport medium  Result Value Ref Range Status   SARS Coronavirus 2 by RT PCR NEGATIVE NEGATIVE Final    Comment: (NOTE) SARS-CoV-2 target nucleic acids are NOT DETECTED.  The SARS-CoV-2 RNA is generally detectable in upper respiratory specimens during the acute phase of infection. The lowest concentration of SARS-CoV-2 viral copies this assay can detect is 138 copies/mL. A negative result does not preclude SARS-Cov-2 infection and should not be used as the sole basis for treatment or other patient management decisions. A negative result may occur with  improper specimen collection/handling, submission of specimen other than nasopharyngeal swab, presence of viral mutation(s) within the areas targeted by this assay, and inadequate number of viral copies(<138 copies/mL). A negative result must  be combined with clinical observations, patient history, and epidemiological information. The expected result is Negative.  Fact Sheet for Patients:  EntrepreneurPulse.com.au  Fact Sheet for Healthcare Providers:  IncredibleEmployment.be  This test is no t yet approved or cleared by the [place] FDA and  has been authorized for detection and/or diagnosis of SARS-CoV-2 by FDA under an Emergency Use Authorization (EUA). This EUA will remain  in effect (meaning this test can be used) for the duration of the COVID-19 declaration under Section 564(b)(1) of the Act, 21 U.S.C.section 360bbb-3(b)(1), unless the authorization is terminated  or revoked sooner.       Influenza A by PCR NEGATIVE NEGATIVE Final   Influenza B by PCR NEGATIVE NEGATIVE Final  Comment: (NOTE) The Xpert Xpress SARS-CoV-2/FLU/RSV plus assay is intended as an aid in the diagnosis of influenza from Nasopharyngeal swab specimens and should not be used as a sole basis for treatment. Nasal washings and aspirates are unacceptable for Xpert Xpress SARS-CoV-2/FLU/RSV testing.  Fact Sheet for Patients: EntrepreneurPulse.com.au  Fact Sheet for Healthcare Providers: IncredibleEmployment.be  This test is not yet approved or cleared by the [place] FDA and has been authorized for detection and/or diagnosis of SARS-CoV-2 by FDA under an Emergency Use Authorization (EUA). This EUA will remain in effect (meaning this test can be used) for the duration of the COVID-19 declaration under Section 564(b)(1) of the Act, 21 U.S.C. section 360bbb-3(b)(1), unless the authorization is terminated or revoked.  Performed at [facility], [address] [place], [place] [address]   Culture, blood (Routine X 2) w Reflex to ID Panel     Status: None (Preliminary result)   Collection Time: 02/13/20 11:28 AM   Specimen: BLOOD  Result Value Ref  Range Status   Specimen Description BLOOD LEFT HAND  Final   Special Requests   Final    BOTTLES DRAWN AEROBIC ONLY Blood Culture results may not be optimal due to an inadequate volume of blood received in culture bottles   Culture   Final    NO GROWTH 4 DAYS Performed at [facility], [address], [place], [place] [address]    Report Status PENDING  Incomplete      Scheduled Meds: . morphine CONCENTRATE  10 mg Sublingual Q6H  . sodium chloride flush  10-40 mL Intracatheter Q12H    Assessment/Plan:  1. End-of-life care.  With change in respiratory status with agonal type of breathing, periods of apnea and hypotension.  I will cancel hospice home transfer today and keep here for comfort care measures. 2. Severe sepsis, present on admission with aspiration pneumonia and acute metabolic encephalopathy. 3. Recent stroke and failed swallow evaluation 4. Recent GI bleed with hematemesis 5. Dehydration 6. Moderate protein calorie malnutrition      Code Status:     Code Status Orders  (From admission, onward)         Start     Ordered   02/25/2020 0456  Do not attempt resuscitation (DNR)  Continuous       Question Answer Comment  In the event of cardiac or respiratory ARREST Do not call a "code blue"   In the event of cardiac or respiratory ARREST Do not perform Intubation, CPR, defibrillation or ACLS   In the event of cardiac or respiratory ARREST Use medication by any route, position, wound care, and other measures to relive pain and suffering. May use oxygen, suction and manual treatment of airway obstruction as needed for comfort.   Comments spoke with son and wife witnessed by RN sandra      03/02/2020 0456        Code Status History    Date Active Date Inactive Code Status Order ID Comments User Context   01/27/2020 1533 02/07/2020 2133 DNR [id number]  Ottie Glazier, MD Inpatient   01/26/2020 1738 01/27/2020 1533 Full Code [id number]  Ottie Glazier, MD ED    Advance Care Planning Activity     Family Communication: Spoke with the patient's son on the phone.  He will call the patient's wife and see if she wants to visit. Disposition Plan: Status is: Inpatient  Dispo: The patient is from: Home  Anticipated d/c is to: Comfort care measures here in the hospital              Anticipated d/c date is: Comfort care measures here in the hospital              Patient currently receiving comfort care measures here in the hospital  Time spent: 25 minutes, case discussed with nursing staff and transitional care team  [facility]  [facility]

## 2020-03-09 NOTE — Death Summary Note (Signed)
DEATH SUMMARY   Patient Details  Name: Eric Kennedy MRN: [id number] DOB: 1934/08/01  Admission/Discharge Information   Admit Date:  02-19-2020  Date of Death: Date of Death: 02/24/20  Time of Death: Time of Death: 1123/06/05  Length of Stay: 5  Referring Physician: [name], MD   Reason(s) for Hospitalization  Fever and vomiting.  Found to have sepsis from aspiration pneumonia and hematemesis.  Diagnoses  Preliminary cause of death:  Secondary Diagnoses (including complications and co-morbidities):  Active Problems:   Hematemesis with nausea   End of life care   Moderate malnutrition (HCC)   Aspiration pneumonia (HCC)   Pneumonia   Severe sepsis (HCC)   UGIB (upper gastrointestinal bleed)   Hx of completed stroke   Agonal respiration   Brief Hospital Course (including significant findings, care, treatment, and services provided and events leading to death)  Eric Kennedy is a 86 y.o. year old male who recently discharged from the hospital on 02/07/2020 after a hospital stay with acute blood loss anemia and upper GI bleed and acute stroke.  He presented back to the hospital with fever and vomiting.  Admitted with sepsis secondary to aspiration pneumonia and also had hematemesis.  The patient was made comfort care measures on 02/13/2020.  He had failed the swallow evaluation with speech therapy.  Comfort care measures were done here in the hospital. He was on the wait list to go to the hospice home and actually had a bed for 2020-02-24.  The morning of Feb 24, 2020 he had a change in his status and had started agonal breathing with periods of apnea.  His blood pressure was also very low this morning.  I canceled the discharge to hospice home and continued comfort care measures here in the hospital.  I spoke with his son on the phone and he spoke with the patient's wife and she was able to come in and be with him at the time of death on 2020/02/24 at 11:25 AM.    Pertinent Labs and  Studies  Significant Diagnostic Studies DG Chest 1 View  Result Date: 2020/02/19 CLINICAL DATA:  Fever EXAM: CHEST  1 VIEW COMPARISON:  January 27, 2020 FINDINGS: The heart size and mediastinal contours are unchanged with mild cardiomegaly. Aortic knob calcifications are seen. A left-sided pacemaker is seen. There is slight interval worsening in the hazy patchy airspace opacities seen throughout the right lung with stable airspace opacities at the left lung base. No pleural effusion. IMPRESSION: Interval slight worsening in the multifocal airspace opacities which could be due to infectious etiology, edema, or atelectasis Electronically Signed   By: Prudencio Pair M.D.   On: 02-19-2020 02:11   DG Chest 1 View  Result Date: 01/26/2020 CLINICAL DATA:  86 year old male found down. EXAM: CHEST  1 VIEW COMPARISON:  None. FINDINGS: Portable AP supine view at 1644 hours. Prior sternotomy, cardiac valve replacement. Left chest dual lead cardiac pacemaker. Cardiac size and mediastinal contours are within normal limits. Mildly low lung volumes. Asymmetric left lateral and costophrenic angle pleural density. Mild associated patchy peripheral opacity in the left lung. Mild streaky opacity in the left mid lung more resembling atelectasis or scarring. Allowing for portable technique the right lung is clear. No pneumothorax is evident. No rib fracture is identified. Grossly intact other visible osseous structures. Negative visible bowel gas pattern. IMPRESSION: 1. Suspicion of a small left pleural effusion, and patchy nonspecific opacity in the left mid lung. No adjacent rib fracture is identified radiographically. 2.  No other acute cardiopulmonary abnormality. Electronically Signed   By: Genevie Ann M.D.   On: 01/26/2020 17:18   DG Pelvis 1-2 Views  Result Date: 01/26/2020 CLINICAL DATA:  86 year old male found down. EXAM: PELVIS - 1-2 VIEW COMPARISON:  None. FINDINGS: Portable AP supine view at 1643 hours. Femoral heads  are normally located. Hip joint spaces are normal for age. The pelvis appears intact. Grossly intact proximal femurs. Calcified femoral artery atherosclerosis. Oval circumscribed probable calcified phlebolith in the right central pelvis. Negative visible bowel gas pattern. IMPRESSION: No acute fracture or dislocation identified about the pelvis. Electronically Signed   By: Genevie Ann M.D.   On: 01/26/2020 17:19   DG Abdomen 1 View  Result Date: 01/26/2020 CLINICAL DATA:  Evaluate OG tube EXAM: ABDOMEN - 1 VIEW COMPARISON:  None. FINDINGS: The OG tube terminates in left upper quadrant, within the stomach. Is difficult to clearly see the side port but I believe the side port is likely in the stomach. IMPRESSION: The OG tube appears to be in good position. Electronically Signed   By: Dorise Bullion III M.D   On: 01/26/2020 19:34   CT HEAD WO CONTRAST  Result Date: 01/27/2020 CLINICAL DATA:  86 year old male found down. Left side weakness with evidence of acute to subacute infarcts in the right MCA territory on presentation head CT 1540 hours 01/26/2020. EXAM: CT HEAD WITHOUT CONTRAST TECHNIQUE: Contiguous axial images were obtained from the base of the skull through the vertex without intravenous contrast. COMPARISON:  Head CTs at 15:40 and 2040 hours yesterday. FINDINGS: Brain: Multifocal evolving cytotoxic edema in the posterior right hemisphere and along the right MCA/PCA watershed area. Areas involved include the superior perirolandic cortex (series 2 images 25 through 27), right parietal lobe, lateral right occipital lobe. No associated acute hemorrhage or intracranial mass effect. Stable gray-white matter differentiation elsewhere since early yesterday. No ventriculomegaly. Vascular: Calcified atherosclerosis at the skull base. No suspicious intracranial vascular hyperdensity. Skull: No acute osseous abnormality identified. Sinuses/Orbits: Stable. Chronic right sphenoid sinusitis. Trace mastoid fluid.  Other: No acute orbit or scalp soft tissue finding. IMPRESSION: 1. Evolving cortical infarcts in the posterior Right MCA and also the Right MCA/PCA watershed area. No associated hemorrhage or mass effect. 2. Stable CT appearance of the brain elsewhere. Electronically Signed   By: Genevie Ann M.D.   On: 01/27/2020 12:08   CT HEAD WO CONTRAST  Result Date: 01/26/2020 CLINICAL DATA:  Found down EXAM: CT HEAD WITHOUT CONTRAST TECHNIQUE: Contiguous axial images were obtained from the base of the skull through the vertex without intravenous contrast. COMPARISON:  01/26/2020 FINDINGS: Brain: Again noted over the small areas of potential acute to subacute infarcts within the right frontal, right parietal lobes. No new suspicious areas for acute infarction. There is atrophy and chronic small vessel disease changes. No hydrocephalus. No hemorrhage. Vascular: No hyperdense vessel or unexpected calcification. Skull: No acute calvarial abnormality. Sinuses/Orbits: Mucosal thickening in the right sphenoid sinus. No acute findings. Other: None IMPRESSION: Stable small low-density areas within the right frontal and parietal lobes which could reflect small acute or subacute infarcts, unchanged since prior study. No hemorrhage. Electronically Signed   By: Rolm Baptise M.D.   On: 01/26/2020 20:49   US Carotid Bilateral  Result Date: 01/27/2020 CLINICAL DATA:  Stroke symptoms, hyperlipidemia and diabetes EXAM: BILATERAL CAROTID DUPLEX ULTRASOUND TECHNIQUE: Pearline Cables scale imaging, color Doppler and duplex ultrasound were performed of bilateral carotid and vertebral arteries in the neck. COMPARISON:  None. FINDINGS: Criteria:  Quantification of carotid stenosis is based on velocity parameters that correlate the residual internal carotid diameter with NASCET-based stenosis levels, using the diameter of the distal internal carotid lumen as the denominator for stenosis measurement. The following velocity measurements were obtained: RIGHT  ICA: 207/24 cm/sec CCA: 833/82 cm/sec SYSTOLIC ICA/CCA RATIO:  2.0 ECA: 179 cm/sec LEFT ICA: 255/25 cm/sec CCA: 50/53 cm/sec SYSTOLIC ICA/CCA RATIO:  2.6 ECA: 280 cm/sec RIGHT CAROTID ARTERY: Intimal thickening and mixed echogenicity heterogeneous carotid bifurcation atherosclerosis. Mid ICA velocity elevation measures 207/24 centimeters/second with mild turbulent flow and spectral broadening. Right ICA stenosis estimated at 50-69% by ultrasound criteria. RIGHT VERTEBRAL ARTERY:  Normal antegrade flow LEFT CAROTID ARTERY: Extensive calcific atherosclerosis of the left carotid bifurcation. Left proximal ICA velocity elevation measures up to 255/25 centimeters/second with turbulent flow and spectral broadening. Left ICA stenosis meets criteria for a greater than 70% stenosis. LEFT VERTEBRAL ARTERY:  Not visualized, suspect occluded IMPRESSION: Right ICA stenosis estimated at 50-69% Left ICA stenosis estimated greater than 70% Patent antegrade flow in the right vertebral artery Nonvisualized left vertebral artery, suspect occluded Electronically Signed   By: Jerilynn Mages.  Shick M.D.   On: 01/27/2020 09:18   US Venous Img Upper Uni Left (DVT)  Result Date: 02/01/2020 CLINICAL DATA:  Swelling x2 days EXAM: LEFT UPPER EXTREMITY VENOUS DOPPLER ULTRASOUND TECHNIQUE: Gray-scale sonography with graded compression, as well as color Doppler and duplex ultrasound were performed to evaluate the upper extremity deep venous system from the level of the subclavian vein and including the jugular, axillary, basilic, radial, ulnar and upper cephalic vein. Spectral Doppler was utilized to evaluate flow at rest and with distal augmentation maneuvers. COMPARISON:  None. FINDINGS: Contralateral Subclavian Vein: Respiratory phasicity is normal and symmetric with the symptomatic side. No evidence of thrombus. Normal compressibility. Internal Jugular Vein: No evidence of thrombus. Normal compressibility, respiratory phasicity and response to  augmentation. Subclavian Vein: Hypoechoic partially occlusive thrombus in the peripheral aspect, with continued color Doppler flow signal through this segment. Axillary Vein: Eccentric thrombus resulting in incomplete compressibility, with some continued color Doppler flow signal. Cephalic Vein: No evidence of thrombus. Normal compressibility, respiratory phasicity and response to augmentation. Portions obscured due to overlying dressing. Basilic Vein: Hypoechoic thrombus resulting in incomplete segmental compressibility. Persistent antegrade color flow signal elsewhere. Brachial Veins: Partially occlusive thrombus Radial Veins: No evidence of thrombus. Normal compressibility, respiratory phasicity and response to augmentation. Ulnar Veins: No evidence of thrombus. Normal compressibility, respiratory phasicity and response to augmentation. Venous Reflux:  None visualized. Other Findings:  None visualized. IMPRESSION: 1. POSITIVE for partially-occlusive left upper extremity DVT involving brachial, axillary, and subclavian veins. 2. Superficial thrombophlebitis involving basilic vein. Electronically Signed   By: Lucrezia Europe M.D.   On: 02/01/2020 10:22   DG Chest Port 1 View  Result Date: 02/13/2020 CLINICAL DATA:  PICC line placement. EXAM: PORTABLE CHEST 1 VIEW COMPARISON:  Earlier film, same date. FINDINGS: The right-sided PICC line tip is in distal SVC near the cavoatrial junction. No complicating features. Stable interstitial and airspace process in the lungs. IMPRESSION: PICC line tip is in the distal SVC near the cavoatrial junction. Electronically Signed   By: Marijo Sanes M.D.   On: 03/07/2020 12:48   DG Chest Port 1 View  Result Date: 01/27/2020 CLINICAL DATA:  Respiratory failure. EXAM: PORTABLE CHEST 1 VIEW COMPARISON:  01/26/2020 and prior radiographs FINDINGS: An endotracheal tube with tip 3 cm above the carina and OG tube entering the stomach again noted. Pulmonary vascular congestion  is again  noted. Bilateral LOWER lung opacities have slightly increased from the prior study. No pneumothorax or large pleural effusion noted. Cardiac surgical changes and LEFT-sided pacemaker again noted. IMPRESSION: Slightly increased bilateral LOWER lung opacities/atelectasis without other significant change. Electronically Signed   By: Margarette Canada M.D.   On: 01/27/2020 07:31   DG Chest Portable 1 View  Result Date: 01/26/2020 CLINICAL DATA:  Evaluate OG tube an ETT placement. EXAM: PORTABLE CHEST 1 VIEW COMPARISON:  January 26, 2020 FINDINGS: An ET tube is been placed in the interval. The distal tip appears to be 3 cm above the carina in good position. The distal tip of the NG tube is in the region of the stomach. The side port is not well seen on this study. The cardiomediastinal silhouette is stable. Probable pulmonary venous congestion. Opacity in the periphery of the left mid lower lung is stable. No other acute abnormalities. No pneumothorax. Small left effusion. IMPRESSION: 1. The distal tip of the NG tube is in the stomach. The side port is difficult to see on this study. The ETT is in good position. 2. Persistent opacity in the periphery of the left mid lower lung with a small left effusion. Electronically Signed   By: Dorise Bullion III M.D   On: 01/26/2020 19:33   DG Swallowing Func-Speech Pathology  Result Date: 01/31/2020 Objective Swallowing Evaluation: Type of Study: MBS-Modified Barium Swallow Study  Patient Details Name: Eric Kennedy MRN: [id number] Date of Birth: 07-15-1934 Today's Date: 01/31/2020 Time: SLP Start Time (ACUTE ONLY): 0825 -SLP Stop Time (ACUTE ONLY): 0850 SLP Time Calculation (min) (ACUTE ONLY): 25 min Past Medical History: Past Medical History: Diagnosis Date . Diabetes mellitus without complication ([place])  . Hypertension  Past Surgical History: Past Surgical History: Procedure Laterality Date . CARDIAC SURGERY    triple bypass . ESOPHAGOGASTRODUODENOSCOPY N/A 01/28/2020   Procedure: ESOPHAGOGASTRODUODENOSCOPY (EGD);  Surgeon: [name], [name], MD;  Location: [facility] ENDOSCOPY;  Service: Gastroenterology;  Laterality: N/A; HPI: Patient is a 86 y.o male with history of coronary artery disease, coronary artery bypass grafting, complete heart block, atrial fibrillation aortic stenosis carotid artery stenosis, history of hypothyroidism and diabetes type 2, adhesive capsulitis of the left shoulder, chronic microcytic anemia history of choroidal hemangioma, dyslipidemia, chronic B12 deficiency admitted to ED on 11/19 d/t mechanical fall in garage. Per wife, patient with noted physical and cognitive decline for past 6 months. CT showed infarcts in R frontal, parietal, and BG areas with additional upper GI bleed dx. D/t acute respiratory failure, patient intubated on 11/19 and recently extubated 11/22. SLP to complete BSE to evaluate patient's oropharyngeal swallowing function and ability to resume PO diet.  Subjective: pt pleasant, not very interaction Assessment / Plan / Recommendation CHL IP CLINICAL IMPRESSIONS 01/31/2020 Clinical Impression Severe oral phase dysphagia and milder pharyngeal phase dysphagia when consuming nectar thick liquids, thin liquids via straw and puree. Pt's oral phase is c/b weak lingual manipulation and reduced posterior propulsion of bolus. This results in delayed oral transit, decreased bolus cohesion, premature spillage of boluses and piecemeal swallowing. Trace oral residue remains post swallow. Pt's pharyngeal phase is c/b swallow initiation at the vallecula with nectar thick liquids and to the level of the pyriform sinuses with thin liquids. Penetration and aspiration were not observed during the study. More advanced solids were not presented d/t severity of oral phase deficits. Recommend initiating a dysphagia 1 diet with thin liquids (straws ok) and medicine crushed in puree. ST to continue following pt.  SLP Visit Diagnosis Dysphagia, oropharyngeal phase  (R13.12) Attention and concentration deficit following -- Frontal lobe and executive function deficit following -- Impact on safety and function Mild aspiration risk   CHL IP TREATMENT RECOMMENDATION 01/31/2020 Treatment Recommendations Therapy as outlined in treatment plan below   Prognosis 01/31/2020 Prognosis for Safe Diet Advancement Fair Barriers to Reach Goals Cognitive deficits;Time post onset;Severity of deficits Barriers/Prognosis Comment -- CHL IP DIET RECOMMENDATION 01/31/2020 SLP Diet Recommendations Dysphagia 1 (Puree) solids;Thin liquid Liquid Administration via Straw;Cup Medication Administration Crushed with puree Compensations Minimize environmental distractions;Slow rate;Small sips/bites Postural Changes Seated upright at 90 degrees   CHL IP OTHER RECOMMENDATIONS 01/31/2020 Recommended Consults -- Oral Care Recommendations Oral care BID Other Recommendations --   CHL IP FOLLOW UP RECOMMENDATIONS 01/31/2020 Follow up Recommendations (No Data)   CHL IP FREQUENCY AND DURATION 01/31/2020 Speech Therapy Frequency (ACUTE ONLY) min 3x week Treatment Duration 2 weeks      CHL IP ORAL PHASE 01/31/2020 Oral Phase Impaired Oral - Pudding Teaspoon -- Oral - Pudding Cup -- Oral - Honey Teaspoon -- Oral - Honey Cup -- Oral - Nectar Teaspoon Weak lingual manipulation;Incomplete tongue to palate contact;Reduced posterior propulsion;Holding of bolus;Piecemeal swallowing;Delayed oral transit;Decreased bolus cohesion;Premature spillage;Lingual/palatal residue Oral - Nectar Cup Weak lingual manipulation;Incomplete tongue to palate contact;Reduced posterior propulsion;Holding of bolus;Lingual/palatal residue;Piecemeal swallowing;Delayed oral transit;Decreased bolus cohesion;Premature spillage Oral - Nectar Straw -- Oral - Thin Teaspoon Weak lingual manipulation;Incomplete tongue to palate contact;Reduced posterior propulsion;Holding of bolus;Lingual/palatal residue;Piecemeal swallowing;Delayed oral transit;Decreased  bolus cohesion;Premature spillage Oral - Thin Cup -- Oral - Thin Straw Weak lingual manipulation;Incomplete tongue to palate contact;Reduced posterior propulsion;Holding of bolus;Piecemeal swallowing;Delayed oral transit;Decreased bolus cohesion;Premature spillage;Lingual/palatal residue Oral - Puree Weak lingual manipulation;Incomplete tongue to palate contact;Reduced posterior propulsion;Holding of bolus;Lingual/palatal residue;Piecemeal swallowing;Delayed oral transit;Decreased bolus cohesion;Premature spillage Oral - Mech Soft -- Oral - Regular -- Oral - Multi-Consistency -- Oral - Pill -- Oral Phase - Comment severe impairments  CHL IP PHARYNGEAL PHASE 01/31/2020 Pharyngeal Phase Impaired Pharyngeal- Pudding Teaspoon -- Pharyngeal -- Pharyngeal- Pudding Cup -- Pharyngeal -- Pharyngeal- Honey Teaspoon -- Pharyngeal -- Pharyngeal- Honey Cup -- Pharyngeal -- Pharyngeal- Nectar Teaspoon Delayed swallow initiation-vallecula;Reduced tongue base retraction Pharyngeal -- Pharyngeal- Nectar Cup Delayed swallow initiation-vallecula;Reduced tongue base retraction Pharyngeal -- Pharyngeal- Nectar Straw -- Pharyngeal -- Pharyngeal- Thin Teaspoon Delayed swallow initiation-pyriform sinuses;Reduced tongue base retraction Pharyngeal -- Pharyngeal- Thin Cup -- Pharyngeal -- Pharyngeal- Thin Straw Delayed swallow initiation-pyriform sinuses;Reduced tongue base retraction Pharyngeal -- Pharyngeal- Puree Delayed swallow initiation-vallecula;Reduced tongue base retraction Pharyngeal -- Pharyngeal- Mechanical Soft -- Pharyngeal -- Pharyngeal- Regular -- Pharyngeal -- Pharyngeal- Multi-consistency -- Pharyngeal -- Pharyngeal- Pill -- Pharyngeal -- Pharyngeal Comment pharyngeal phase is slow and is without penetration or aspiration  CHL IP CERVICAL ESOPHAGEAL PHASE 01/31/2020 Cervical Esophageal Phase WFL Pudding Teaspoon -- Pudding Cup -- Honey Teaspoon -- Honey Cup -- Nectar Teaspoon -- Nectar Cup -- Nectar Straw -- Thin Teaspoon --  Thin Cup -- Thin Straw -- Puree -- Mechanical Soft -- Regular -- Multi-consistency -- Pill -- Cervical Esophageal Comment -- Happi B. Rutherford Nail M.S., CCC-SLP, [facility] Speech-Language Pathologist Rehabilitation Services Office [phone] [name] 01/31/2020, 10:14 AM              ECHOCARDIOGRAM COMPLETE  Result Date: 01/28/2020    ECHOCARDIOGRAM REPORT   Patient Name:   Eric Kennedy Date of Exam: 01/27/2020 Medical Rec #:  [id number]   Height:       68.0 in Accession #:    [id number]  Weight:  160.7 lb Date of Birth:  Aug 20, 1934   BSA:          1.862 m Patient Age:    53 years    BP:           116/49 mmHg Patient Gender: M           HR:           60 bpm. Exam Location:  [facility] Procedure: 2D Echo, Cardiac Doppler and Color Doppler Indications:     Stroke 434.91  History:         Patient has no prior history of Echocardiogram examinations.                  Prior CABG; Risk Factors:Hypertension and Diabetes.  Sonographer:     Sherrie Sport RDCS (AE) Referring Phys:  [id number] Lorenza Chick Diagnosing Phys: Serafina Royals MD  Sonographer Comments: Echo performed with patient supine and on artificial respirator and suboptimal apical window. IMPRESSIONS  1. Left ventricular ejection fraction, by estimation, is 50 to 55%. The left ventricle has low normal function. The left ventricle has no regional wall motion abnormalities. Left ventricular diastolic parameters were normal.  2. Right ventricular systolic function is normal. The right ventricular size is normal.  3. Left atrial size was moderately dilated.  4. 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  5. The aortic valve is calcified. Aortic valve regurgitation is trivial. Moderate aortic valve stenosis. FINDINGS  Left Ventricle: Left ventricular ejection fraction, by estimation, is 50 to 55%. The left ventricle has low normal function. The left ventricle has no regional wall motion abnormalities. The left ventricular  internal cavity size was normal in size. There is no left ventricular hypertrophy. Left ventricular diastolic parameters were normal. Right Ventricle: The right ventricular size is normal. No increase in right ventricular wall thickness. Right ventricular systolic function is normal. Left Atrium: Left atrial size was moderately dilated. Right Atrium: Right atrial size was normal in size. Pericardium: There is no evidence of pericardial effusion. 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 Mild mitral valve stenosis. MV peak gradient, 19.2 mmHg. The mean mitral valve gradient is 8.0 mmHg. Tricuspid Valve: The tricuspid valve is normal in structure. Tricuspid valve regurgitation is mild. Aortic Valve: The aortic valve is calcified. Aortic valve regurgitation is trivial. Moderate aortic stenosis is present. Aortic valve mean gradient measures 26.0 mmHg. Aortic valve peak gradient measures 44.3 mmHg. Aortic valve area, by VTI measures 0.60  cm. Pulmonic Valve: The pulmonic valve was normal in structure. Pulmonic valve regurgitation is not visualized. Aorta: The aortic root and ascending aorta are structurally normal, with no evidence of dilitation. IAS/Shunts: No atrial level shunt detected by color flow Doppler.  LEFT VENTRICLE PLAX 2D LVIDd:         4.30 cm LVIDs:         2.53 cm LV PW:         0.98 cm LV IVS:        0.62 cm LVOT diam:     2.00 cm LV SV:         45 LV SV Index:   24 LVOT Area:     3.14 cm  RIGHT VENTRICLE RV Basal diam:  5.72 cm RV S prime:     9.68 cm/s TAPSE (M-mode): 3.6 cm LEFT ATRIUM              Index  RIGHT ATRIUM           Index LA diam:        5.10 cm  2.74 cm/m  RA Area:     24.20 cm LA Vol (A2C):   147.0 ml 78.93 ml/m RA Volume:   82.10 ml  44.08 ml/m LA Vol (A4C):   100.0 ml 53.70 ml/m LA Biplane Vol: 121.0 ml 64.97 ml/m  AORTIC VALVE AV Area (Vmax):    0.52 cm AV Area (Vmean):   0.54 cm AV Area (VTI):     0.60 cm AV Vmax:           332.75  cm/s AV Vmean:          234.750 cm/s AV VTI:            0.739 m AV Peak Grad:      44.3 mmHg AV Mean Grad:      26.0 mmHg LVOT Vmax:         55.40 cm/s LVOT Vmean:        40.500 cm/s LVOT VTI:          0.142 m LVOT/AV VTI ratio: 0.19  AORTA Ao Root diam: 2.00 cm MITRAL VALVE                TRICUSPID VALVE MV Area (PHT): 2.37 cm     TR Peak grad:   49.6 mmHg MV Peak grad:  19.2 mmHg    TR Vmax:        352.00 cm/s MV Mean grad:  8.0 mmHg MV Vmax:       2.19 m/s     SHUNTS MV Vmean:      131.0 cm/s   Systemic VTI:  0.14 m MV Decel Time: 320 msec     Systemic Diam: 2.00 cm MV E velocity: 169.00 cm/s MV A velocity: 164.00 cm/s MV E/A ratio:  1.03 Serafina Royals MD Electronically signed by Serafina Royals MD Signature Date/Time: 01/28/2020/6:32:58 AM    Final    CT HEAD CODE STROKE WO CONTRAST  Result Date: 01/26/2020 CLINICAL DATA:  Code stroke. Left-sided collect and upper extremity weakness. EXAM: CT HEAD WITHOUT CONTRAST TECHNIQUE: Contiguous axial images were obtained from the base of the skull through the vertex without intravenous contrast. COMPARISON:  01/03/2018 head and neck CTA FINDINGS: The study is mildly motion degraded despite repeat imaging. Brain: There are small cortical infarcts in the posterior right frontal lobe (precentral gyrus extending into the centrum semiovale) and right parietal lobe which are new from the CT and of indeterminate age though may be acute or subacute. There is also a new age indeterminate lacunar infarct in the right caudate head. No acute intracranial hemorrhage, mass, midline shift, or extra-axial fluid collection is identified. There is mild cerebral atrophy. Hypodensities in the cerebral white matter bilaterally are nonspecific but compatible with mild chronic small vessel ischemic disease. Vascular: Calcified atherosclerosis at the skull base. No hyperdense vessel. Skull: No fracture or suspicious osseous lesion. Sinuses/Orbits: Chronic right sphenoid sinusitis. Small  chronic right mastoid effusion. Bilateral cataract extraction. Other: None. ASPECTS [facility] Stroke Program Early CT Score) - Ganglionic level infarction (caudate, lentiform nuclei, internal capsule, insula, M1-M3 cortex): 6 - Supraganglionic infarction (M4-M6 cortex): 2 Total score (0-10 with 10 being normal): 8 IMPRESSION: 1. Small, potentially acute or subacute infarcts in the right frontal lobe, right parietal lobe, and right basal ganglia. No hemorrhage. 2. ASPECTS is 8. 3. Mild chronic small vessel ischemic disease. These results were  called by telephone at the time of interpretation on 01/26/2020 at 3:56 pm to Dr. Vladimir Crofts, who verbally acknowledged these results. Electronically Signed   By: Logan Bores M.D.   On: 01/26/2020 15:57   [place] EKG SITE RITE  Result Date: 03/04/2020 If Site Rite image not attached, placement could not be confirmed due to current cardiac rhythm.   Microbiology Recent Results (from the past 240 hour(s))  Culture, blood (Routine x 2)     Status: None   Collection Time: 02/15/2020  1:50 AM   Specimen: BLOOD  Result Value Ref Range Status   Specimen Description BLOOD BLOOD RIGHT ARM  Final   Special Requests   Final    BOTTLES DRAWN AEROBIC AND ANAEROBIC Blood Culture adequate volume   Culture   Final    NO GROWTH 5 DAYS Performed at [facility], [address] [place], [place] [address]    Report Status 03/17/20 FINAL  Final  Resp Panel by RT-PCR (Flu A&B, Covid) Nasopharyngeal Swab     Status: None   Collection Time: 02/15/2020  1:50 AM   Specimen: Nasopharyngeal Swab; Nasopharyngeal(NP) swabs in vial transport medium  Result Value Ref Range Status   SARS Coronavirus 2 by RT PCR NEGATIVE NEGATIVE Final    Comment: (NOTE) SARS-CoV-2 target nucleic acids are NOT DETECTED.  The SARS-CoV-2 RNA is generally detectable in upper respiratory specimens during the acute phase of infection. The lowest concentration of SARS-CoV-2 viral copies this assay  can detect is 138 copies/mL. A negative result does not preclude SARS-Cov-2 infection and should not be used as the sole basis for treatment or other patient management decisions. A negative result may occur with  improper specimen collection/handling, submission of specimen other than nasopharyngeal swab, presence of viral mutation(s) within the areas targeted by this assay, and inadequate number of viral copies(<138 copies/mL). A negative result must be combined with clinical observations, patient history, and epidemiological information. The expected result is Negative.  Fact Sheet for Patients:  EntrepreneurPulse.com.au  Fact Sheet for Healthcare Providers:  IncredibleEmployment.be  This test is no t yet approved or cleared by the [place] FDA and  has been authorized for detection and/or diagnosis of SARS-CoV-2 by FDA under an Emergency Use Authorization (EUA). This EUA will remain  in effect (meaning this test can be used) for the duration of the COVID-19 declaration under Section 564(b)(1) of the Act, 21 U.S.C.section 360bbb-3(b)(1), unless the authorization is terminated  or revoked sooner.       Influenza A by PCR NEGATIVE NEGATIVE Final   Influenza B by PCR NEGATIVE NEGATIVE Final    Comment: (NOTE) The Xpert Xpress SARS-CoV-2/FLU/RSV plus assay is intended as an aid in the diagnosis of influenza from Nasopharyngeal swab specimens and should not be used as a sole basis for treatment. Nasal washings and aspirates are unacceptable for Xpert Xpress SARS-CoV-2/FLU/RSV testing.  Fact Sheet for Patients: EntrepreneurPulse.com.au  Fact Sheet for Healthcare Providers: IncredibleEmployment.be  This test is not yet approved or cleared by the [place] FDA and has been authorized for detection and/or diagnosis of SARS-CoV-2 by FDA under an Emergency Use Authorization (EUA). This EUA will  remain in effect (meaning this test can be used) for the duration of the COVID-19 declaration under Section 564(b)(1) of the Act, 21 U.S.C. section 360bbb-3(b)(1), unless the authorization is terminated or revoked.  Performed at [facility], [address] [place], [place] [address]   Culture, blood (Routine X 2) w Reflex to  ID Panel     Status: None (Preliminary result)   Collection Time: 02/13/20 11:28 AM   Specimen: BLOOD  Result Value Ref Range Status   Specimen Description BLOOD LEFT HAND  Final   Special Requests   Final    BOTTLES DRAWN AEROBIC ONLY Blood Culture results may not be optimal due to an inadequate volume of blood received in culture bottles   Culture   Final    NO GROWTH 4 DAYS Performed at [facility], [address] [place], [place] [address]    Report Status PENDING  Incomplete    Lab Basic Metabolic Panel: Recent Labs  Lab 02/28/2020 0150 02/15/2020 0500  NA 142 142  K 3.8 3.6  CL 108 107  CO2 24 24  GLUCOSE 159* 176*  BUN 19 19  CREATININE 1.31* 1.18  CALCIUM 9.7 9.6   Liver Function Tests: Recent Labs  Lab 02/13/2020 0150  AST 29  ALT 21  ALKPHOS 125  BILITOT 1.8*  PROT 6.7  ALBUMIN 3.0*   CBC: Recent Labs  Lab 02/15/2020 0150 02/24/2020 0500 02/24/2020 0546 02/25/2020 1254 02/16/2020 1928 02/13/20 0401  WBC 18.8* 16.1*  --   --   --   --   NEUTROABS 16.7*  --   --   --   --   --   HGB 10.8* 10.3* 10.6* 9.7* 8.2* 9.1*  HCT 37.5* 35.2* 35.3* 32.4* 27.9* 31.3*  MCV 82.1 81.9  --   --   --   --   PLT 527* 433*  --   --   --   --    Sepsis Labs: Recent Labs  Lab 03/07/2020 0150 02/10/2020 0500 02/07/2020 0556  WBC 18.8* 16.1*  --   LATICACIDVEN 2.2*  --  1.9     Eric Kennedy 03/09/2020, 12:18 PM

## 2020-03-09 NOTE — TOC Transition Note (Signed)
Transition of Care [facility]) - CM/SW Discharge Note   Patient Details  Name: Eric Kennedy MRN: [id number] Date of Birth: 1934/07/01  Transition [facility] CM/SW Contact:  Meriel Flavors, LCSW Phone Number: 03/10/2020, 9:15 AM   Clinical Narrative:    CSW received notice from attending patient is not stable to transition to hospice home. Attending requested CSW notify hospice home patient will not be coming today. CSW called, spoke with Jackelyn Poling and provided update.     Barriers to Discharge: Hospice Bed not available   Patient Goals and CMS Choice        Discharge Placement                       Discharge Plan and Services     Post Acute Care Choice: Hospice                               Social Determinants of Health (SDOH) Interventions     Readmission Risk Interventions No flowsheet data found.

## 2020-03-09 DEATH — deceased

## 2021-07-26 IMAGING — CT CT HEAD W/O CM
3 of 4 series · 14 of 47 positions shown, 16 images · non-contrast
Comparison: Head CTs at [DATE] and 1515 hours yesterday.

CLINICAL DATA: 85-year-old male found down. Left side weakness with
evidence of acute to subacute infarcts in the right MCA territory on
presentation head CT 3415 hours 01/26/2020.

EXAM:
CT HEAD WITHOUT CONTRAST
TECHNIQUE: Contiguous axial images were obtained from the base of the skull
through the vertex without intravenous contrast.

[Series 4: coronal soft tissue · coronal · 0.33mm/px · 3 of 72 slices shown]
[im 26/72  brain]
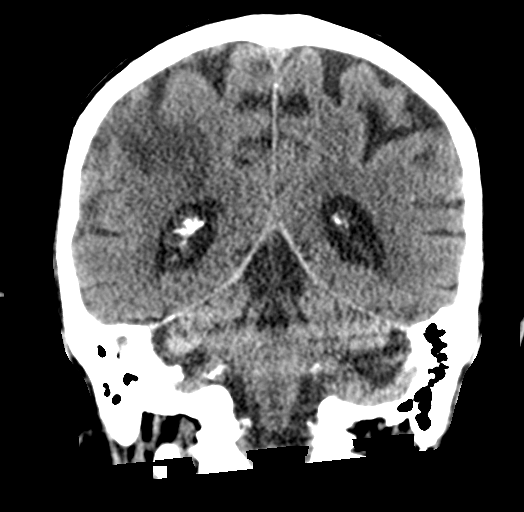
[im 33/72  brain]
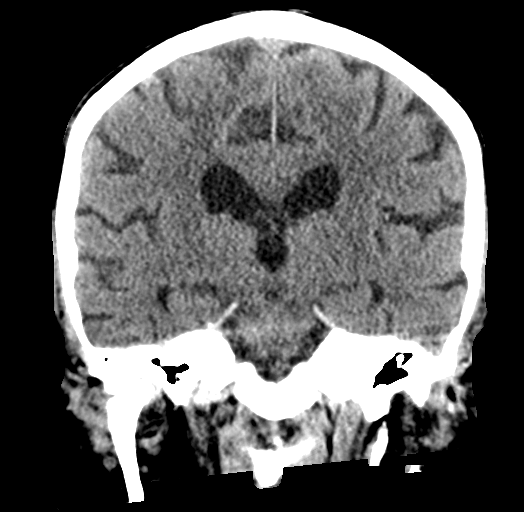
[im 39/72  brain]
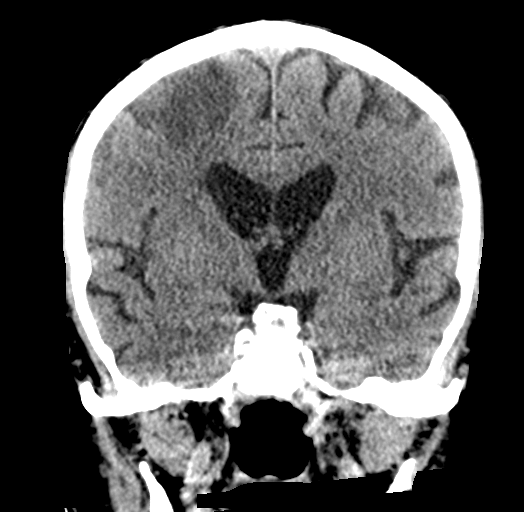

[Series 5: sagittal soft tissue · sagittal · 0.33mm/px · 3 of 58 slices shown]
[im 20/58  brain]
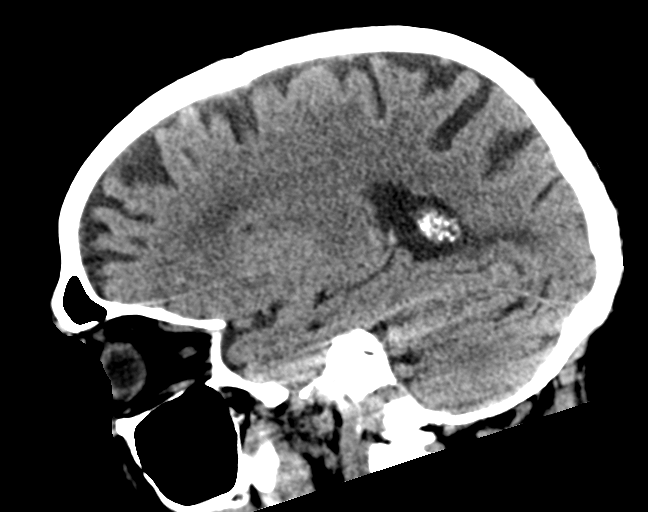
[im 29/58  brain]
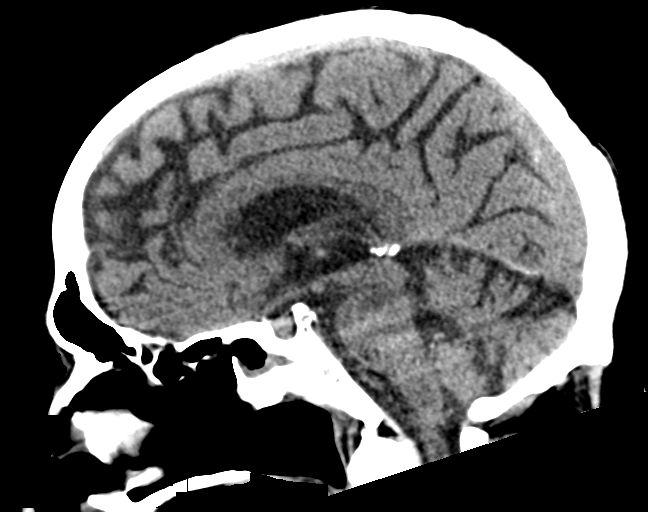
[im 39/58  brain]
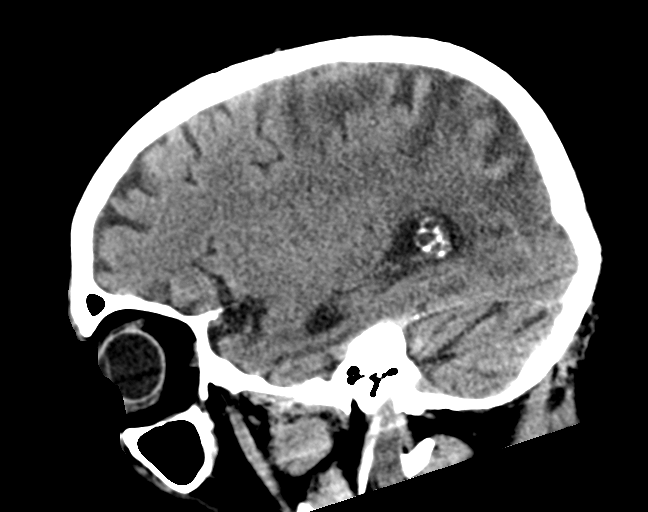

[Series 6: axial soft tissue · axial · 0.34mm/px · z∈[-147,-31]mm · 8 of 57 slices shown, 10 images]
[im 7/57  brain]
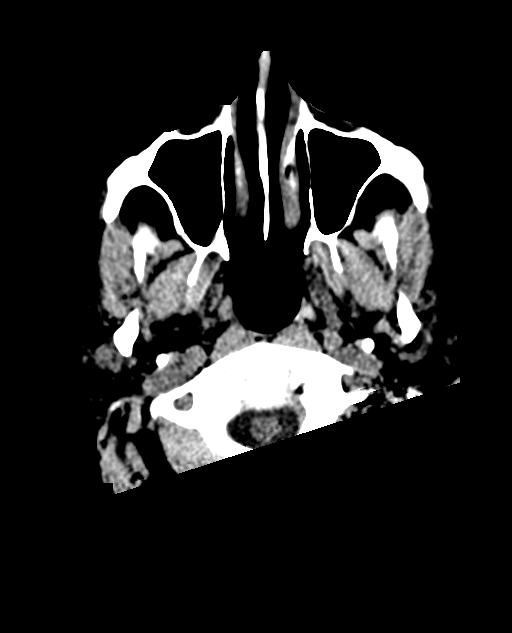
[im 7/57  bone]
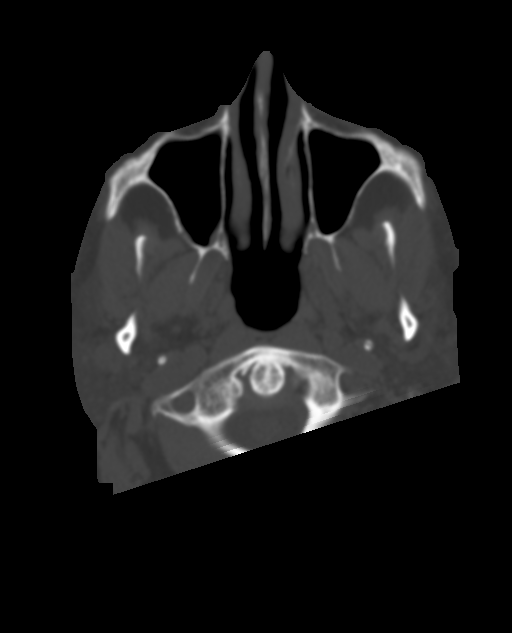
[im 13/57  brain]
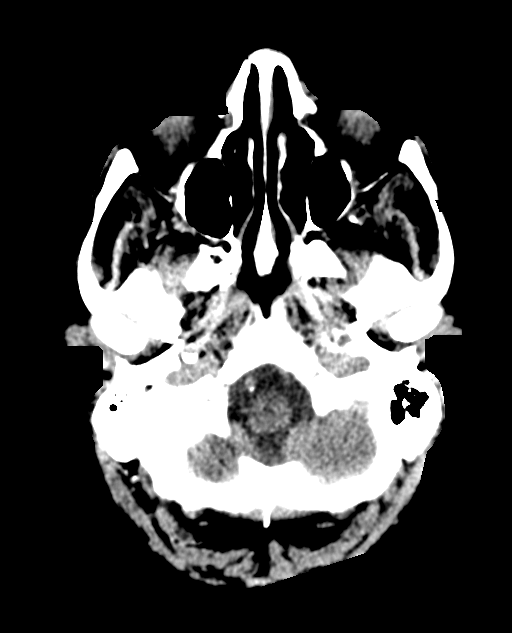
[im 19/57  brain]
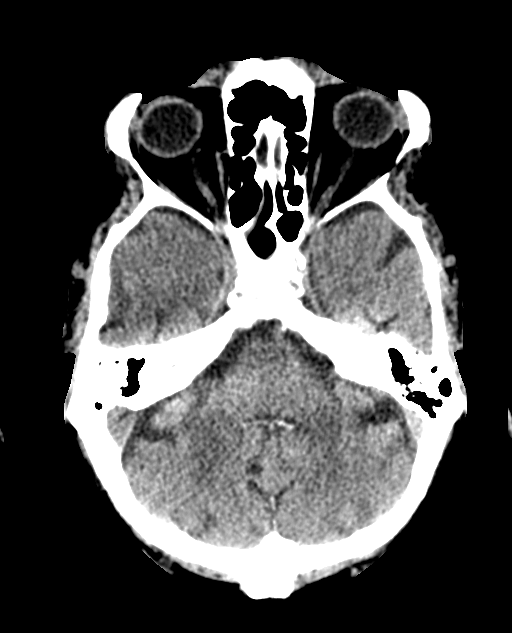
[im 25/57  brain]
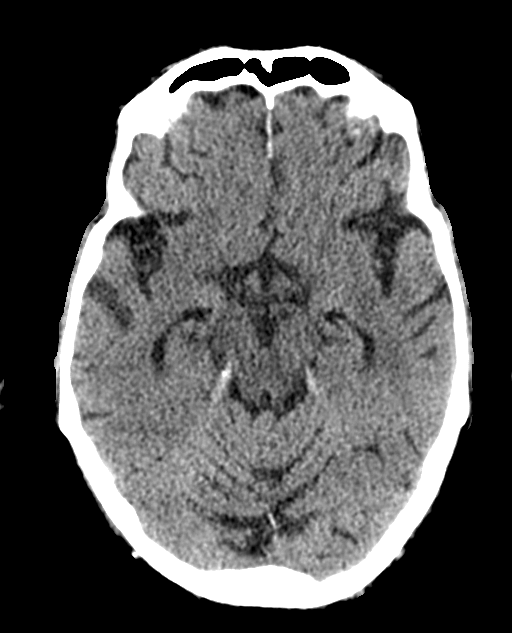
[im 32/57  brain]
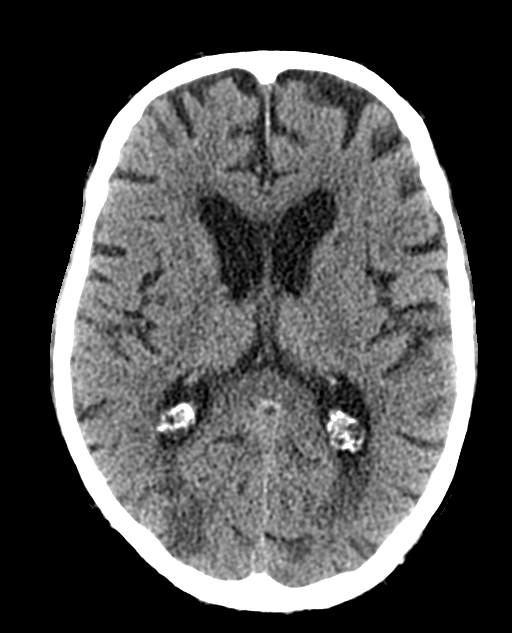
[im 32/57  bone]
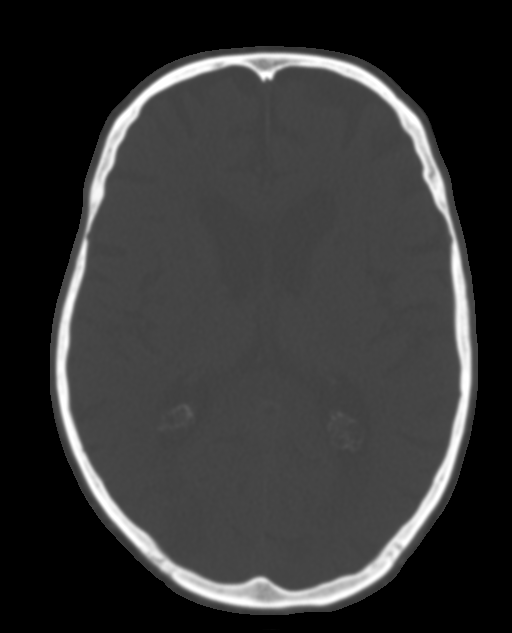
[im 38/57  brain]
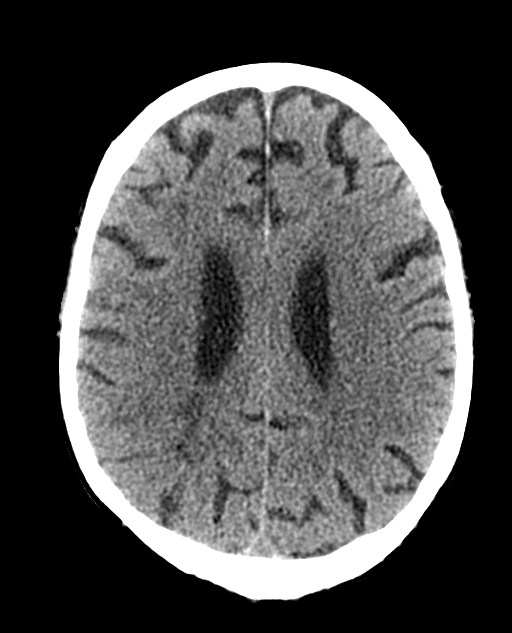
[im 44/57  brain]
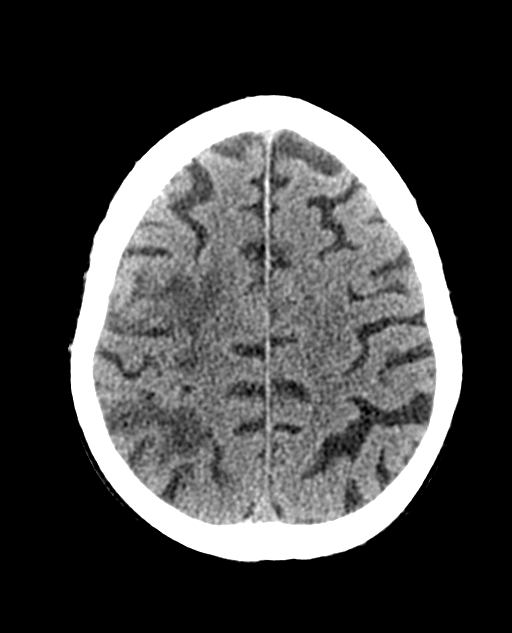
[im 50/57  brain]
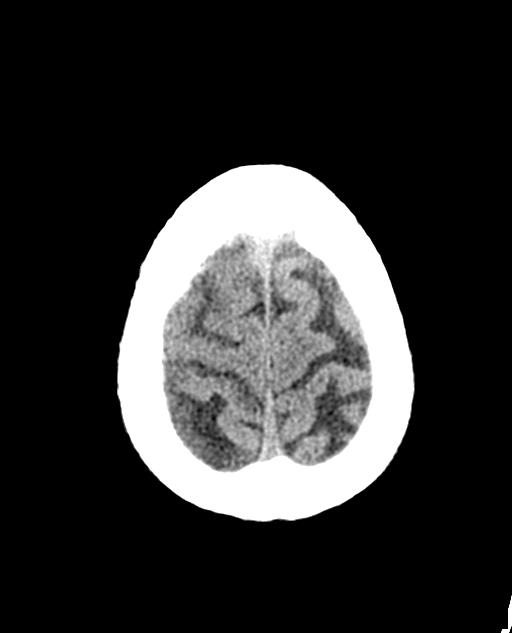

[14 of 47 positions shown; findings below may reference images not displayed]

FINDINGS: Brain: Multifocal evolving cytotoxic edema in the posterior right
hemisphere and along the right MCA/PCA watershed area. Areas
involved include the superior perirolandic cortex (series 2 images
25 through 27), right parietal lobe, lateral right occipital lobe.

No associated acute hemorrhage or intracranial mass effect. Stable
gray-white matter differentiation elsewhere since early yesterday.
No ventriculomegaly.

Vascular: Calcified atherosclerosis at the skull base. No suspicious
intracranial vascular hyperdensity.

Skull: No acute osseous abnormality identified.

Sinuses/Orbits: Stable. Chronic right sphenoid sinusitis. Trace
mastoid fluid.

Other: No acute orbit or scalp soft tissue finding.
IMPRESSION: 1. Evolving cortical infarcts in the posterior Right MCA and also
the Right MCA/PCA watershed area. No associated hemorrhage or mass
effect.

2. Stable CT appearance of the brain elsewhere.

## 2021-07-31 IMAGING — US US EXTREM  UP VENOUS*L*
1 series · 13 of 24 positions shown · non-contrast
Comparison: None.

CLINICAL DATA: Swelling x2 days



[Series 1: us venous img upper uni left (dvt) · portal-venous · 13 of 54 slices shown]
[im 1/54]
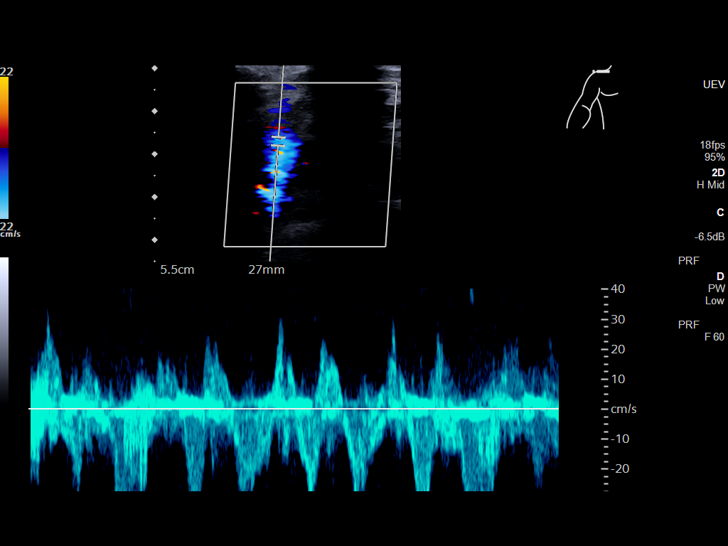
[im 5/54]
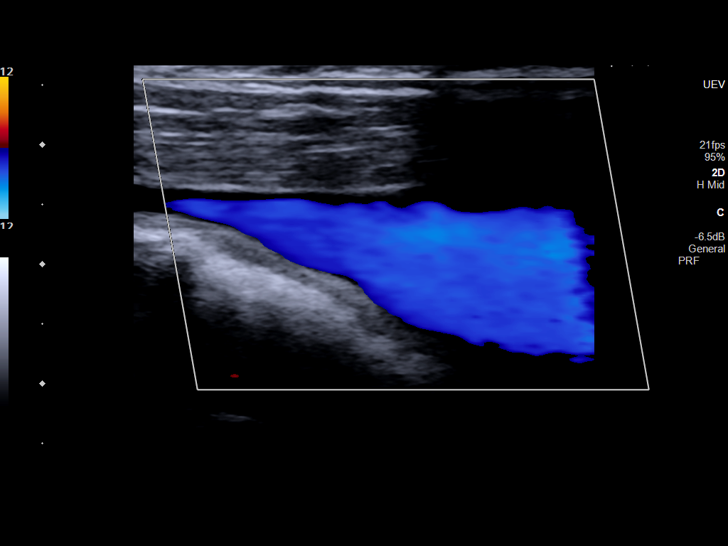
[im 10/54]
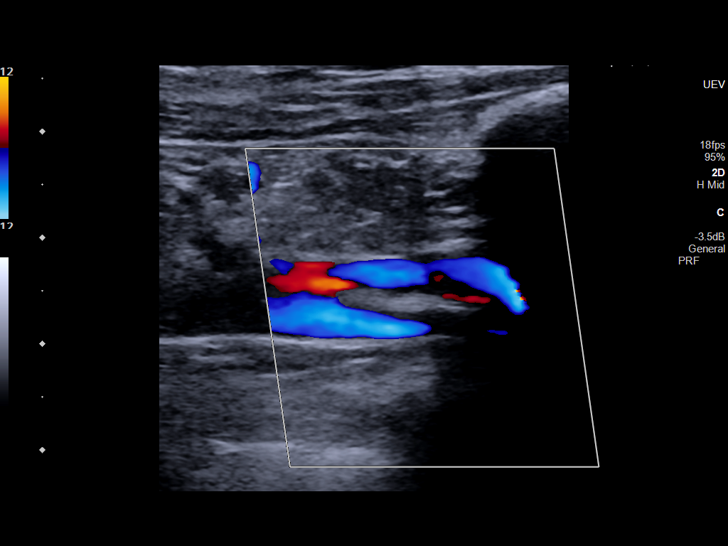
[im 14/54]
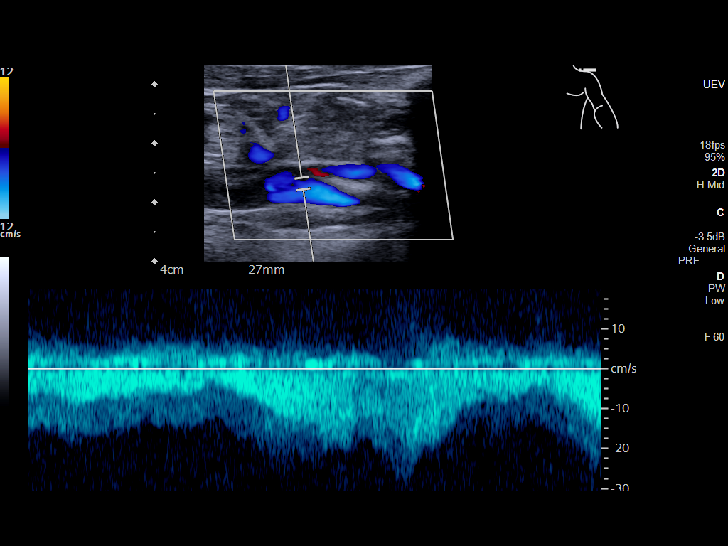
[im 19/54]
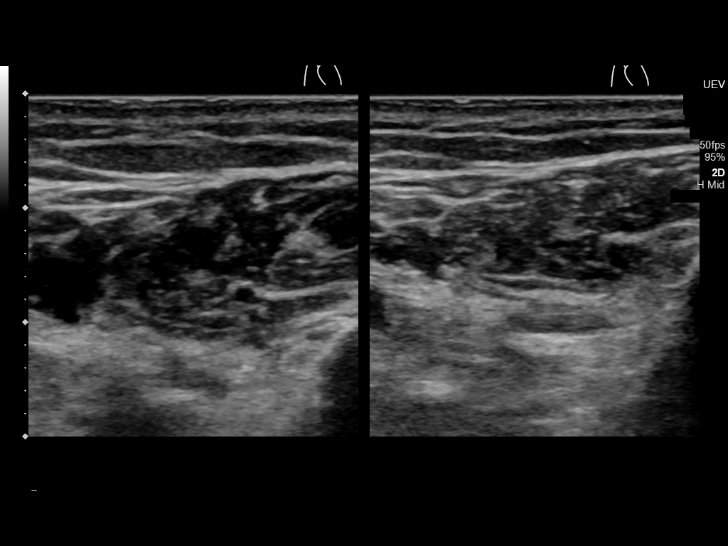
[im 24/54]
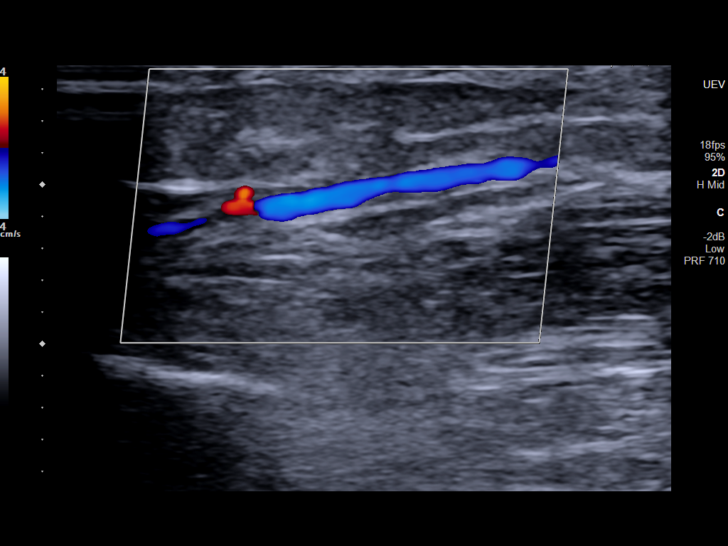
[im 28/54]
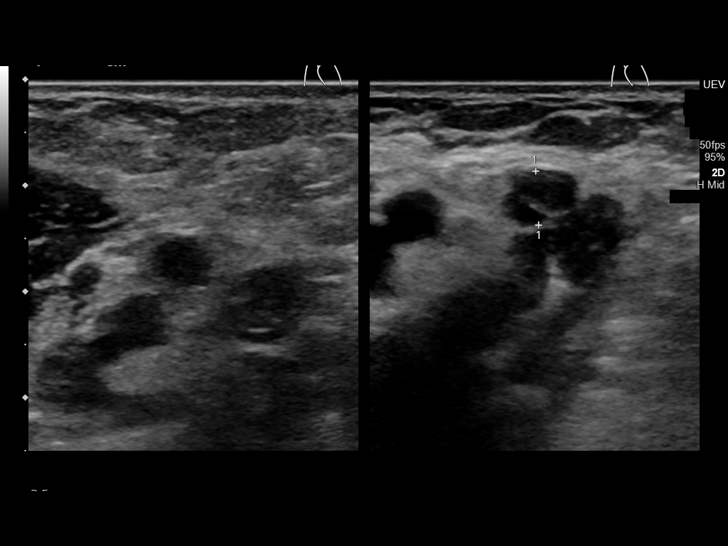
[im 30/54]
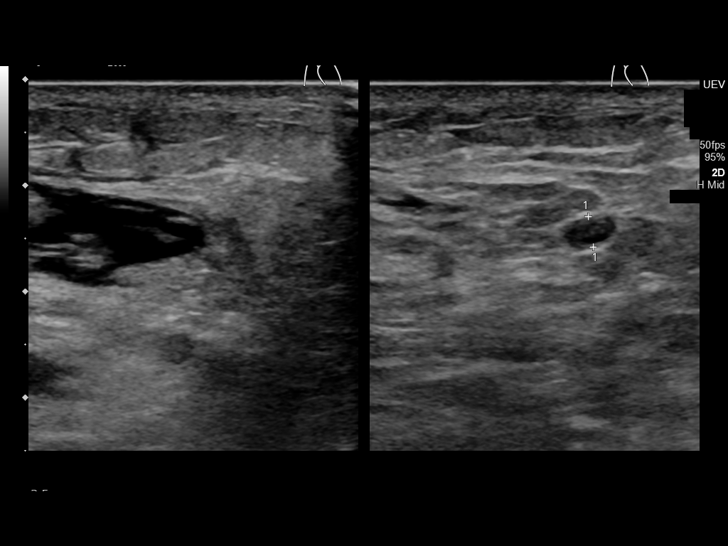
[im 35/54]
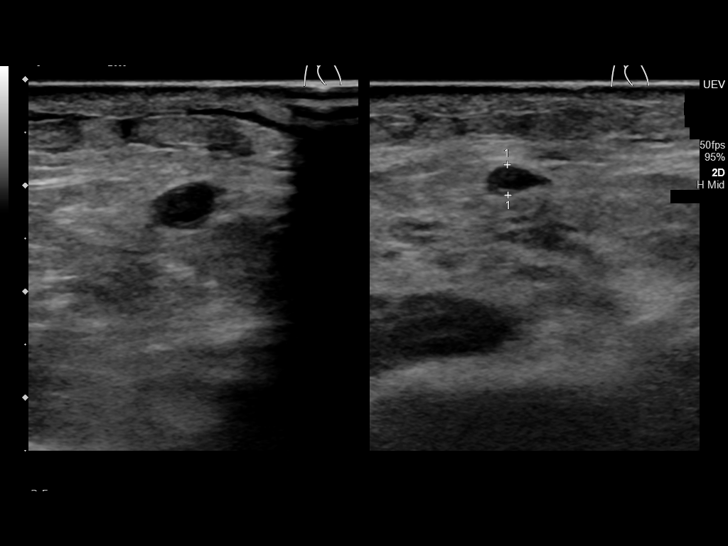
[im 40/54]
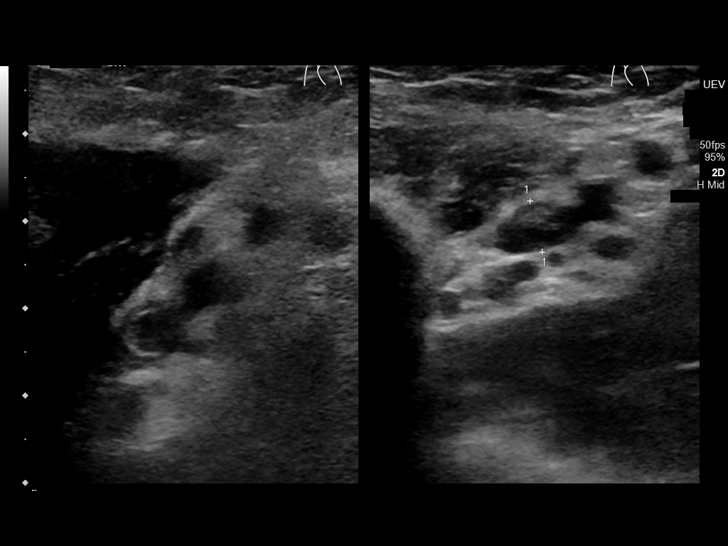
[im 44/54]
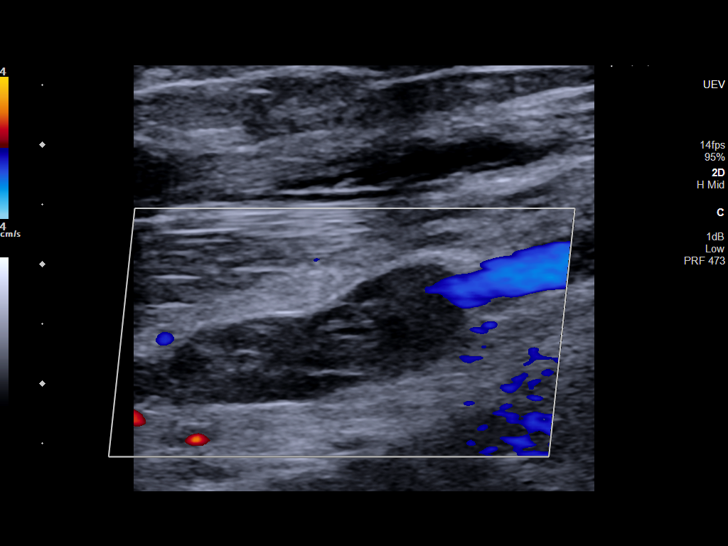
[im 49/54]
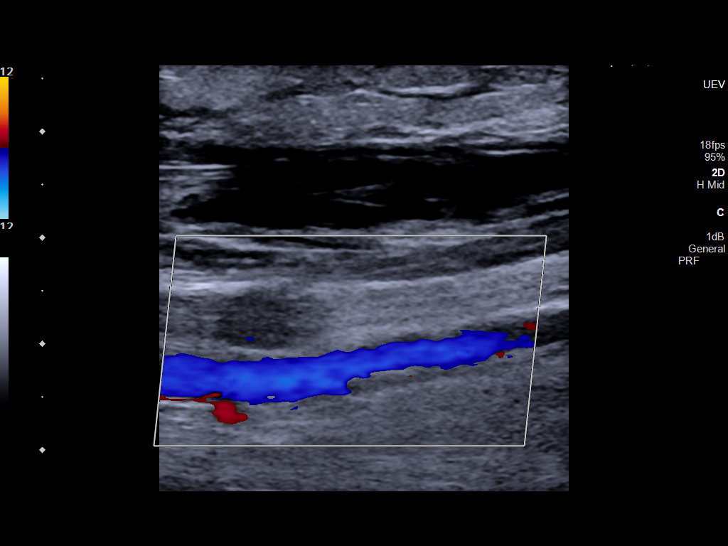
[im 54/54]
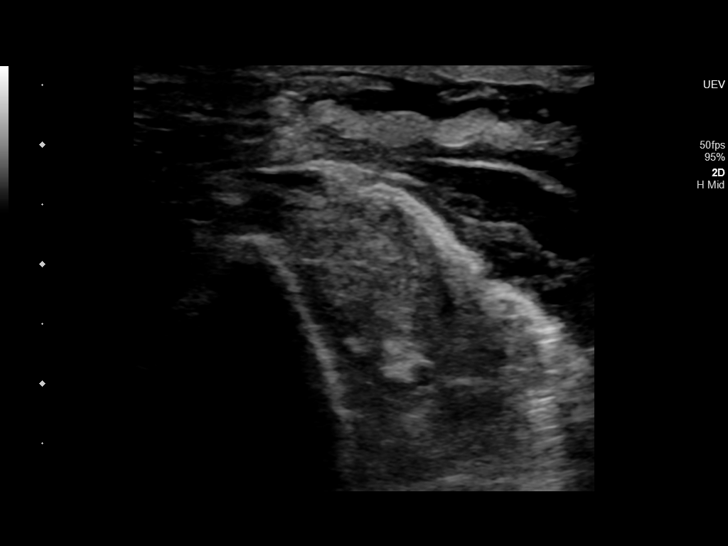

[13 of 24 positions shown; findings below may reference images not displayed]

FINDINGS: Contralateral Subclavian Vein: Respiratory phasicity is normal and
symmetric with the symptomatic side. No evidence of thrombus. Normal
compressibility.

Internal Jugular Vein: No evidence of thrombus. Normal
compressibility, respiratory phasicity and response to augmentation.

Subclavian Vein: Hypoechoic partially occlusive thrombus in the
peripheral aspect, with continued color Doppler flow signal through
this segment.

Axillary Vein: Eccentric thrombus resulting in incomplete
compressibility, with some continued color Doppler flow signal.

Cephalic Vein: No evidence of thrombus. Normal compressibility,
respiratory phasicity and response to augmentation. Portions
obscured due to overlying dressing.

Basilic Vein: Hypoechoic thrombus resulting in incomplete segmental
compressibility. Persistent antegrade color flow signal elsewhere.

Brachial Veins: Partially occlusive thrombus

Radial Veins: No evidence of thrombus. Normal compressibility,
respiratory phasicity and response to augmentation.

Ulnar Veins: No evidence of thrombus. Normal compressibility,
respiratory phasicity and response to augmentation.

Venous Reflux:  None visualized.

Other Findings:  None visualized.
IMPRESSION: 1. POSITIVE for partially-occlusive left upper extremity DVT
involving brachial, axillary, and subclavian veins.
2. Superficial thrombophlebitis involving basilic vein.

## 2021-08-11 IMAGING — DX DG CHEST 1V
1 series · 1 of 1 positions shown · non-contrast
Comparison: January 27, 2020

CLINICAL DATA: Fever

EXAM:
CHEST  1 VIEW

[chest ap]
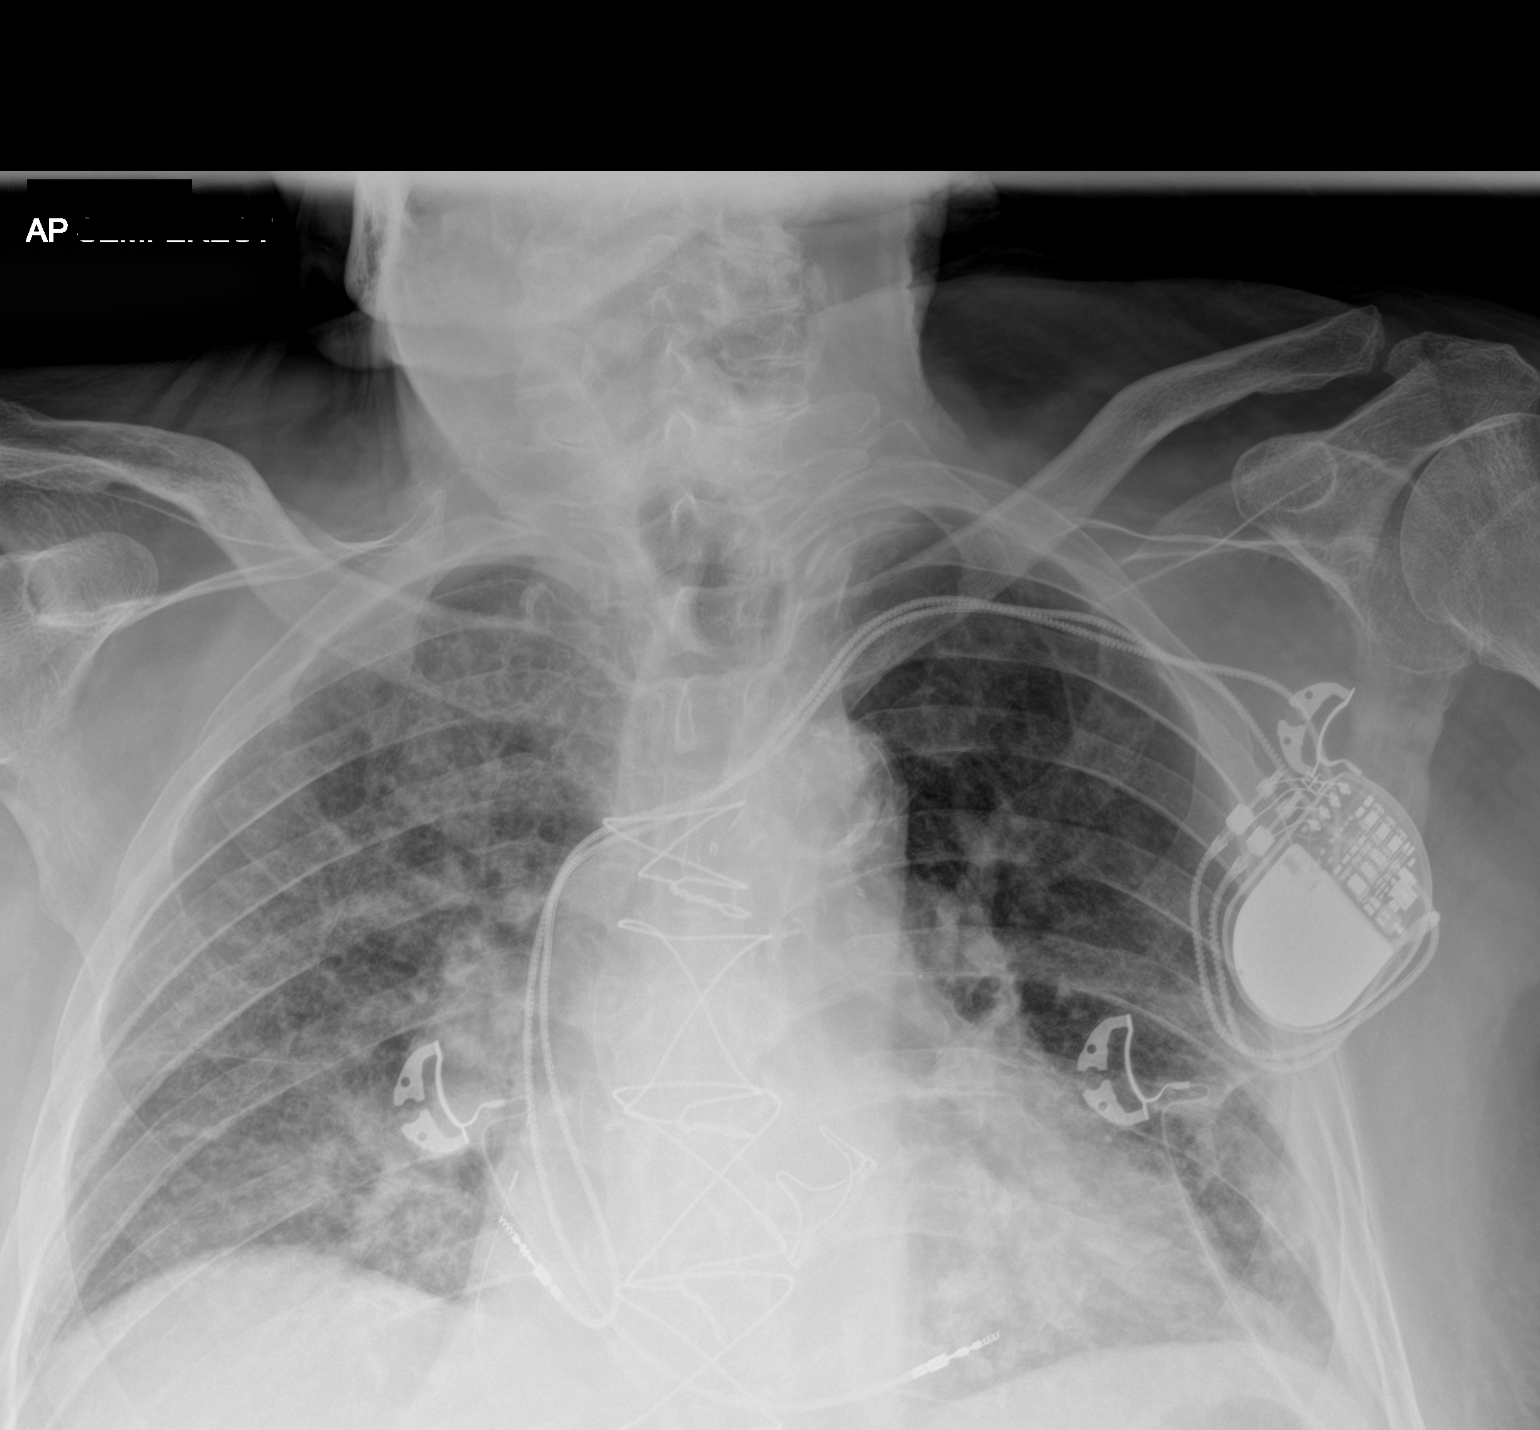

[1 of 1 positions shown; findings below may reference images not displayed]

FINDINGS: The heart size and mediastinal contours are unchanged with mild
cardiomegaly. Aortic knob calcifications are seen. A left-sided
pacemaker is seen. There is slight interval worsening in the hazy
patchy airspace opacities seen throughout the right lung with stable
airspace opacities at the left lung base. No pleural effusion.
IMPRESSION: Interval slight worsening in the multifocal airspace opacities which
could be due to infectious etiology, edema, or atelectasis
